# Patient Record
Sex: Male | Born: 1940
Health system: Southern US, Community
[De-identification: ages and names within clinical notes are randomized; demographics above are authoritative.]

## PROBLEM LIST (undated history)

## (undated) DIAGNOSIS — I509 Heart failure, unspecified: Secondary | ICD-10-CM

## (undated) DIAGNOSIS — G4730 Sleep apnea, unspecified: Secondary | ICD-10-CM

## (undated) DIAGNOSIS — I4892 Unspecified atrial flutter: Secondary | ICD-10-CM

## (undated) DIAGNOSIS — I499 Cardiac arrhythmia, unspecified: Secondary | ICD-10-CM

## (undated) DIAGNOSIS — N400 Enlarged prostate without lower urinary tract symptoms: Secondary | ICD-10-CM

## (undated) DIAGNOSIS — M545 Low back pain, unspecified: Secondary | ICD-10-CM

## (undated) DIAGNOSIS — I679 Cerebrovascular disease, unspecified: Secondary | ICD-10-CM

## (undated) DIAGNOSIS — I495 Sick sinus syndrome: Secondary | ICD-10-CM

## (undated) DIAGNOSIS — Z950 Presence of cardiac pacemaker: Secondary | ICD-10-CM

## (undated) DIAGNOSIS — I480 Paroxysmal atrial fibrillation: Secondary | ICD-10-CM

## (undated) DIAGNOSIS — R531 Weakness: Secondary | ICD-10-CM

## (undated) DIAGNOSIS — I10 Essential (primary) hypertension: Secondary | ICD-10-CM

## (undated) HISTORY — DX: Sleep apnea, unspecified: G47.30

## (undated) HISTORY — DX: Essential (primary) hypertension: I10

## (undated) HISTORY — DX: Low back pain, unspecified: M54.50

## (undated) HISTORY — PX: ABLATION: SHX5711

## (undated) HISTORY — DX: Benign prostatic hyperplasia without lower urinary tract symptoms: N40.0

## (undated) HISTORY — DX: Low back pain: M54.5

## (undated) HISTORY — DX: Unspecified atrial flutter: I48.92

## (undated) HISTORY — PX: INSERT / REPLACE / REMOVE PACEMAKER: SUR710

## (undated) HISTORY — DX: Sick sinus syndrome: I49.5

## (undated) HISTORY — PX: CATARACT EXTRACTION: SUR2

## (undated) HISTORY — DX: Paroxysmal atrial fibrillation: I48.0

## (undated) HISTORY — DX: Cerebrovascular disease, unspecified: I67.9

## (undated) HISTORY — DX: Weakness: R53.1

---

## 1964-09-18 HISTORY — PX: PACEMAKER INSERTION: SHX728

## 2002-06-16 ENCOUNTER — Ambulatory Visit (HOSPITAL_COMMUNITY): Admission: RE | Admit: 2002-06-16 | Discharge: 2002-06-16 | Payer: Self-pay | Admitting: Internal Medicine

## 2002-06-16 ENCOUNTER — Encounter: Payer: Self-pay | Admitting: Internal Medicine

## 2002-06-18 ENCOUNTER — Encounter (HOSPITAL_COMMUNITY): Admission: RE | Admit: 2002-06-18 | Discharge: 2002-07-18 | Payer: Self-pay | Admitting: Internal Medicine

## 2002-06-19 ENCOUNTER — Encounter: Payer: Self-pay | Admitting: Internal Medicine

## 2003-09-08 ENCOUNTER — Other Ambulatory Visit: Admission: RE | Admit: 2003-09-08 | Discharge: 2003-09-08 | Payer: Self-pay | Admitting: Dermatology

## 2004-01-21 ENCOUNTER — Ambulatory Visit (HOSPITAL_COMMUNITY): Admission: RE | Admit: 2004-01-21 | Discharge: 2004-01-21 | Payer: Self-pay | Admitting: *Deleted

## 2004-09-18 HISTORY — PX: CERVICAL DISCECTOMY: SHX98

## 2005-03-24 ENCOUNTER — Ambulatory Visit (HOSPITAL_COMMUNITY): Admission: RE | Admit: 2005-03-24 | Discharge: 2005-03-24 | Payer: Self-pay | Admitting: Internal Medicine

## 2005-03-27 ENCOUNTER — Ambulatory Visit (HOSPITAL_COMMUNITY): Admission: RE | Admit: 2005-03-27 | Discharge: 2005-03-27 | Payer: Self-pay | Admitting: Internal Medicine

## 2005-10-06 ENCOUNTER — Inpatient Hospital Stay (HOSPITAL_COMMUNITY): Admission: RE | Admit: 2005-10-06 | Discharge: 2005-10-07 | Payer: Self-pay | Admitting: Neurosurgery

## 2006-11-05 ENCOUNTER — Ambulatory Visit: Admission: RE | Admit: 2006-11-05 | Discharge: 2006-11-05 | Payer: Self-pay | Admitting: Internal Medicine

## 2006-11-17 ENCOUNTER — Ambulatory Visit: Payer: Self-pay | Admitting: Pulmonary Disease

## 2006-12-27 ENCOUNTER — Ambulatory Visit (HOSPITAL_COMMUNITY): Admission: RE | Admit: 2006-12-27 | Discharge: 2006-12-27 | Payer: Self-pay | Admitting: Internal Medicine

## 2007-06-11 ENCOUNTER — Ambulatory Visit (HOSPITAL_COMMUNITY): Admission: RE | Admit: 2007-06-11 | Discharge: 2007-06-11 | Payer: Self-pay | Admitting: Internal Medicine

## 2007-06-17 ENCOUNTER — Ambulatory Visit (HOSPITAL_COMMUNITY): Admission: RE | Admit: 2007-06-17 | Discharge: 2007-06-17 | Payer: Self-pay | Admitting: Internal Medicine

## 2007-09-19 DIAGNOSIS — Z950 Presence of cardiac pacemaker: Secondary | ICD-10-CM

## 2007-09-19 HISTORY — DX: Presence of cardiac pacemaker: Z95.0

## 2008-03-24 ENCOUNTER — Encounter (INDEPENDENT_AMBULATORY_CARE_PROVIDER_SITE_OTHER): Payer: Self-pay | Admitting: Internal Medicine

## 2008-03-24 ENCOUNTER — Ambulatory Visit: Payer: Self-pay | Admitting: Cardiology

## 2008-03-24 ENCOUNTER — Ambulatory Visit (HOSPITAL_COMMUNITY): Admission: RE | Admit: 2008-03-24 | Discharge: 2008-03-24 | Payer: Self-pay | Admitting: Internal Medicine

## 2008-03-25 ENCOUNTER — Ambulatory Visit (HOSPITAL_COMMUNITY): Admission: RE | Admit: 2008-03-25 | Discharge: 2008-03-25 | Payer: Self-pay | Admitting: Cardiology

## 2008-03-25 ENCOUNTER — Ambulatory Visit: Payer: Self-pay | Admitting: Cardiology

## 2008-03-26 ENCOUNTER — Encounter (INDEPENDENT_AMBULATORY_CARE_PROVIDER_SITE_OTHER): Payer: Self-pay | Admitting: *Deleted

## 2008-03-26 LAB — CONVERTED CEMR LAB
AST: 17 units/L
Alkaline Phosphatase: 72 units/L
BUN: 17 mg/dL
Glucose, Bld: 92 mg/dL
Platelets: 223 10*3/uL
Potassium: 3.9 meq/L
Sodium: 138 meq/L
TSH: 3.492 microintl units/mL
WBC: 6 10*3/uL

## 2008-03-30 ENCOUNTER — Ambulatory Visit: Payer: Self-pay | Admitting: Cardiology

## 2008-04-01 ENCOUNTER — Encounter (HOSPITAL_COMMUNITY): Admission: RE | Admit: 2008-04-01 | Discharge: 2008-05-01 | Payer: Self-pay | Admitting: Cardiology

## 2008-04-01 ENCOUNTER — Ambulatory Visit: Payer: Self-pay | Admitting: Cardiology

## 2008-04-08 ENCOUNTER — Ambulatory Visit: Payer: Self-pay | Admitting: Cardiology

## 2008-04-15 ENCOUNTER — Ambulatory Visit: Payer: Self-pay | Admitting: Cardiovascular Disease

## 2008-04-28 ENCOUNTER — Ambulatory Visit: Payer: Self-pay | Admitting: Cardiology

## 2008-05-15 ENCOUNTER — Ambulatory Visit: Payer: Self-pay | Admitting: Cardiology

## 2008-05-21 ENCOUNTER — Ambulatory Visit (HOSPITAL_COMMUNITY): Admission: RE | Admit: 2008-05-21 | Discharge: 2008-05-22 | Payer: Self-pay | Admitting: Internal Medicine

## 2008-05-21 ENCOUNTER — Ambulatory Visit: Payer: Self-pay | Admitting: Internal Medicine

## 2008-05-26 ENCOUNTER — Ambulatory Visit: Payer: Self-pay | Admitting: Cardiology

## 2008-05-28 ENCOUNTER — Ambulatory Visit (HOSPITAL_COMMUNITY): Admission: RE | Admit: 2008-05-28 | Discharge: 2008-05-28 | Payer: Self-pay | Admitting: Cardiology

## 2008-06-05 ENCOUNTER — Ambulatory Visit: Payer: Self-pay | Admitting: Cardiology

## 2008-06-10 ENCOUNTER — Ambulatory Visit: Payer: Self-pay

## 2008-06-23 ENCOUNTER — Ambulatory Visit: Payer: Self-pay | Admitting: Cardiology

## 2008-07-06 ENCOUNTER — Ambulatory Visit: Payer: Self-pay | Admitting: Cardiology

## 2008-07-10 ENCOUNTER — Ambulatory Visit: Payer: Self-pay | Admitting: Cardiology

## 2008-07-10 ENCOUNTER — Emergency Department (HOSPITAL_COMMUNITY): Admission: RE | Admit: 2008-07-10 | Discharge: 2008-07-10 | Payer: Self-pay | Admitting: Cardiology

## 2008-07-20 ENCOUNTER — Ambulatory Visit: Payer: Self-pay | Admitting: Cardiology

## 2008-07-24 ENCOUNTER — Ambulatory Visit (HOSPITAL_COMMUNITY): Admission: RE | Admit: 2008-07-24 | Discharge: 2008-07-24 | Payer: Self-pay | Admitting: Cardiology

## 2008-07-24 ENCOUNTER — Ambulatory Visit: Payer: Self-pay | Admitting: Cardiology

## 2008-07-24 ENCOUNTER — Encounter: Payer: Self-pay | Admitting: Cardiology

## 2008-07-31 DIAGNOSIS — G8929 Other chronic pain: Secondary | ICD-10-CM | POA: Insufficient documentation

## 2008-07-31 DIAGNOSIS — I679 Cerebrovascular disease, unspecified: Secondary | ICD-10-CM

## 2008-07-31 DIAGNOSIS — M545 Low back pain, unspecified: Secondary | ICD-10-CM | POA: Insufficient documentation

## 2008-07-31 DIAGNOSIS — Z87898 Personal history of other specified conditions: Secondary | ICD-10-CM

## 2008-08-26 ENCOUNTER — Ambulatory Visit: Payer: Self-pay | Admitting: Internal Medicine

## 2008-10-01 ENCOUNTER — Encounter (INDEPENDENT_AMBULATORY_CARE_PROVIDER_SITE_OTHER): Payer: Self-pay | Admitting: *Deleted

## 2008-10-13 ENCOUNTER — Ambulatory Visit: Payer: Self-pay | Admitting: Cardiology

## 2009-06-16 ENCOUNTER — Ambulatory Visit: Payer: Self-pay | Admitting: Internal Medicine

## 2009-06-16 DIAGNOSIS — Z950 Presence of cardiac pacemaker: Secondary | ICD-10-CM | POA: Insufficient documentation

## 2009-06-25 ENCOUNTER — Encounter: Payer: Self-pay | Admitting: Adult Health

## 2009-12-22 ENCOUNTER — Encounter: Payer: Self-pay | Admitting: Internal Medicine

## 2009-12-22 ENCOUNTER — Ambulatory Visit: Payer: Self-pay | Admitting: Cardiology

## 2009-12-28 ENCOUNTER — Telehealth (INDEPENDENT_AMBULATORY_CARE_PROVIDER_SITE_OTHER): Payer: Self-pay | Admitting: *Deleted

## 2010-02-08 ENCOUNTER — Encounter (INDEPENDENT_AMBULATORY_CARE_PROVIDER_SITE_OTHER): Payer: Self-pay | Admitting: *Deleted

## 2010-02-09 ENCOUNTER — Encounter (INDEPENDENT_AMBULATORY_CARE_PROVIDER_SITE_OTHER): Payer: Self-pay | Admitting: *Deleted

## 2010-02-17 ENCOUNTER — Ambulatory Visit: Payer: Self-pay | Admitting: Cardiology

## 2010-02-17 DIAGNOSIS — E785 Hyperlipidemia, unspecified: Secondary | ICD-10-CM

## 2010-02-17 DIAGNOSIS — R42 Dizziness and giddiness: Secondary | ICD-10-CM

## 2010-02-17 DIAGNOSIS — R079 Chest pain, unspecified: Secondary | ICD-10-CM

## 2010-02-23 ENCOUNTER — Ambulatory Visit: Payer: Self-pay | Admitting: Cardiology

## 2010-02-23 ENCOUNTER — Ambulatory Visit (HOSPITAL_COMMUNITY): Admission: RE | Admit: 2010-02-23 | Discharge: 2010-02-23 | Payer: Self-pay | Admitting: Cardiology

## 2010-02-23 ENCOUNTER — Encounter: Payer: Self-pay | Admitting: Cardiology

## 2010-02-24 ENCOUNTER — Encounter: Payer: Self-pay | Admitting: Cardiology

## 2010-03-07 ENCOUNTER — Encounter: Payer: Self-pay | Admitting: Cardiology

## 2010-08-04 ENCOUNTER — Ambulatory Visit (HOSPITAL_COMMUNITY): Admission: RE | Admit: 2010-08-04 | Payer: Self-pay | Admitting: Physical Medicine and Rehabilitation

## 2010-10-04 ENCOUNTER — Telehealth (INDEPENDENT_AMBULATORY_CARE_PROVIDER_SITE_OTHER): Payer: Self-pay

## 2010-10-08 ENCOUNTER — Encounter: Payer: Self-pay | Admitting: Physical Medicine and Rehabilitation

## 2010-10-18 NOTE — Assessment & Plan Note (Signed)
Summary: past due for f/u per pt request/tg   Visit Type:  Follow-up Primary Provider:  Dr.Roy Fagen   History of Present Illness: [name] returns to the office as scheduled for continued assessment and treatment of sick sinus syndrome.  He remains essentially asymptomatic, reporting only brief orthostatic lightheadedness when arising from a chair or from a reclining position.  He has suffered no falls nor loss of consciousness.  He experiences occasional episodic vague parasternal chest discomfort unassociated with exertion, which resolves spontaneously.  Exercise tolerance is good without any exertion-related symptoms.  He denies dyspnea, orthopnea, PND or pedal edema.  He has stopped his walking program and gained a significant amount of weight.  Current Medications (verified): 1)  Diltiazem Hcl Er Beads 420 Mg Xr24h-Cap (Diltiazem Hcl Er Beads) .... Take 1 Tab Daily 2)  Omeprazole 20 Mg Cpdr (Omeprazole) .... Take 1 Tab Daily 3)  Tamsulosin Hcl 0.4 Mg Caps (Tamsulosin Hcl) .... Take 1 Tab Daily 4)  Metoprolol Tartrate 25 Mg Tabs (Metoprolol Tartrate) .... Take 1/2 Tab Two Times A Day 5)  Hydrocodone-Acetaminophen 5-500 Mg Tabs (Hydrocodone-Acetaminophen) .... Take As Needed For Pain 6)  Ecotrin 325 Mg Tbec (Aspirin) .... One By Mouth Daily 7)  Gabapentin 300 Mg Caps (Gabapentin) .... Take 1 Tab Three Times A Day 8)  Cozaar 50 Mg Tabs (Losartan Potassium) .... Take 1 Tab Daily  Allergies (verified): 1)  Penicillin  Past History:  PMH, FH, and Social History reviewed and updated.  Review of Systems       See history of present illness.  Vital Signs:  Patient profile:   70 year old male Weight:      235 pounds BMI:     35.86 Pulse rate:   62 / minute BP sitting:   115 / 59  (right arm)  Vitals Entered By: Dreama Saa, CNA (February 17, 2010 2:27 PM)  Physical Exam  General:    Overweight; well developed; no acute distress:   Weight-235, 16 pounds more than in  09/2008 Neck-No JVD; no carotid bruits: Lungs-No tachypnea, no rales; no rhonchi; no wheezes: Cardiovascular-normal PMI; distant S1 and S2: Abdomen-BS normal; soft and non-tender without masses or organomegaly:  Musculoskeletal-No deformities, no cyanosis or clubbing: Neurologic-Normal cranial nerves; symmetric strength and tone:  Skin-Warm, no significant lesions: Extremities-Nl distal pulses; no edema:     PPM Specifications Following MD:  Lewayne Bunting, MD     PPM Vendor:  St Jude     PPM Model Number:  [id number]     PPM Serial Number:  [id number] PPM DOI:  05/21/2008     PPM Implanting MD:  Sherryl Manges, MD  Lead 1    Location: RA     DOI: 05/21/2008     Model #: 1688TC     Serial #: [id number]     Status: active Lead 2    Location: RV     DOI: 05/21/2008     Model #: 1688TC     Serial #: [id number]     Status: active  Magnet Response Rate:  BOL 98.6 ERI  86.3  Indications:  Tachy-brady syndrome   PPM Follow Up Pacer Dependent:  No      Episodes Coumadin:  No  Parameters Mode:  DDDR     Lower Rate Limit:  60     Upper Rate Limit:  120 Paced AV Delay:  275     Sensed AV Delay:  250  Impression & Recommendations:  Problem #  1:  HYPERLIPIDEMIA (ICD-272.4) Metabolic profile and lipid profile will be obtained.  Problem # 2:  ATRIAL FIBRILLATION (ICD-427.31) Most recent pacemaker assessment demonstrated atrial fibrillation less than 4% of the time with no episode extending beyond 10 hours.  This should convey a low risk for thromboembolism, and treatment with Coumadin does not appear warranted.  Problem # 3:  CARDIAC PACEMAKER IN SITU (ICD-V45.01) Dr. Ladona Ridgel continues to assess the patient's pacemaker regularly.  Problem # 4:  CHEST PAIN (ICD-786.50) Although chest discomfort is atypical, patient has multiple risk factors for atherosclerosis and an equivocal stress nuclear study a few years ago..  A stress echocardiogram will be performed.  If results are good as expected, I will plan  to see this nice gentleman again in one year.  Other Orders: T-Comprehensive Metabolic Panel [id number]) T-Lipid Profile ([id number]) Stress Echo (Stress Echo)  Patient Instructions: 1)  Your physician recommends that you schedule a follow-up appointment in: 1 year 2)  Your physician recommends that you return for lab work in: Tomorrow 3)  Your physician recommends that you continue on your current medications as directed. Please refer to the Current Medication list given to you today. 4)  Your physician has requested that you have a stress echocardiogram. For further information please visit [email or web address]  Please follow instruction sheet as given. 5)  Your physician discussed the importance of regular exercise and recommended that you start or continue a regular exercise program for good health. 6)  Your physician encouraged you to lose weight for better health.

## 2010-10-18 NOTE — Procedures (Signed)
Summary: 6 mth f/u per checkout on 06/16/09/tg   Current Medications (verified): 1)  Diltiazem Hcl Er Beads 420 Mg Xr24h-Cap (Diltiazem Hcl Er Beads) .... Take 1 Tab Daily 2)  Omeprazole 20 Mg Cpdr (Omeprazole) .... Take 1 Tab Daily 3)  Tamsulosin Hcl 0.4 Mg Caps (Tamsulosin Hcl) .... Take 1 Tab Daily 4)  Metoprolol Tartrate 25 Mg Tabs (Metoprolol Tartrate) .... Take 1/2 Tab Two Times A Day 5)  Hydrocodone-Acetaminophen 5-500 Mg Tabs (Hydrocodone-Acetaminophen) .... Take As Needed For Pain 6)  Ecotrin 325 Mg Tbec (Aspirin) .... One By Mouth Daily  Allergies (verified): 1)  Penicillin   PPM Specifications Following MD:  Lewayne Bunting, MD     PPM Vendor:  St Jude     PPM Model Number:  [id number]     PPM Serial Number:  [id number] PPM DOI:  05/21/2008     PPM Implanting MD:  Sherryl Manges, MD  Lead 1    Location: RA     DOI: 05/21/2008     Model #: 1688TC     Serial #: [id number]     Status: active Lead 2    Location: RV     DOI: 05/21/2008     Model #: 1688TC     Serial #: [id number]     Status: active  Magnet Response Rate:  BOL 98.6 ERI  86.3  Indications:  Tachy-brady syndrome   PPM Follow Up Remote Check?  No Battery Voltage:  2.78 V     Battery Est. Longevity:  5 years     Pacer Dependent:  No       PPM Device Measurements Atrium  Amplitude: 1.5 mV, Impedance: 476 ohms,  Right Ventricle  Amplitude: 12 mV, Impedance: 590 ohms, Threshold: 0.75 V at 0.5 msec  Episodes MS Episodes:  127     Percent Mode Switch:  3.4%     Coumadin:  No Ventricular High Rate:  1     Atrial Pacing:  88%     Ventricular Pacing:  <1%  Parameters Mode:  DDDR     Lower Rate Limit:  60     Upper Rate Limit:  120 Paced AV Delay:  275     Sensed AV Delay:  250 Next Cardiology Appt Due:  06/18/2010 Tech Comments:  No parameter changes.  A-fib with 1 VHR noted.  He is not on coumadin.  The longest A-fib episode was 9:46 hours.  ROV 6 months with Dr. Ladona Ridgel in [place]. Altha Harm, LPN  December 22, 2829 10:25 AM  MD  Comments:  Agree with above. Chads 1

## 2010-10-18 NOTE — Miscellaneous (Signed)
Summary: nuclear stress test   Clinical Lists Changes  Observations: Added new observation of NUCLEAR NOS:  The patient exercised 9 minutes 52 seconds (52 seconds into stage IV of the  Bruce protocol) obtaining a maximal heart rate of 144 (90% of the age-  predicted maximal heart rate) at a workload of 12.9 METS and discontinued  exercise due to fatigue.  There were no symptoms of chest pain.  There were  infrequent atrial and ventricular premature complexes.  There was 1 mm ST  segment depression during late exercise which was not consistently exhibited  on tracings during recovery.  The baseline electrocardiogram revealed normal  sinus rhythm at 62 beats per minute with inferolateral T-wave inversions.  His initial blood pressure was elevated at 150/68.  He reached a peak blood  pressure of 200/60.   IMPRESSION:  Borderline exercise stress test.  Cardiolite images pending.                                               Kingsley Callander. Ouida Sills, M.D.  (06/18/2002 9:07)      Nuclear Study  Procedure date:  06/18/2002  Findings:       The patient exercised 9 minutes 52 seconds (52 seconds into stage IV of the  Bruce protocol) obtaining a maximal heart rate of 144 (90% of the age-  predicted maximal heart rate) at a workload of 12.9 METS and discontinued  exercise due to fatigue.  There were no symptoms of chest pain.  There were  infrequent atrial and ventricular premature complexes.  There was 1 mm ST  segment depression during late exercise which was not consistently exhibited  on tracings during recovery.  The baseline electrocardiogram revealed normal  sinus rhythm at 62 beats per minute with inferolateral T-wave inversions.  His initial blood pressure was elevated at 150/68.  He reached a peak blood  pressure of 200/60.   IMPRESSION:  Borderline exercise stress test.  Cardiolite images pending.                                               Kingsley Callander. Ouida Sills, M.D.

## 2010-10-18 NOTE — Letter (Signed)
Summary: LABS 01-24-10  LABS 01-24-10   Imported By: [name] 02/24/2010 11:01:24  _____________________________________________________________________  External Attachment:    Type:   Image     Comment:   External Document

## 2010-10-18 NOTE — Progress Notes (Signed)
Summary: phone note for refill  Phone Note Call from Patient   Caller: Patient Summary of Call: s:wants refill for metoprolol tartrate sent to [facility] [place] [facility]mg  takes 1/2 tab two times a day also would like a 90 day supply instead of 30. Initial call taken by: Dreama Saa, CNA,  December 28, 2009 11:56 AM  Follow-up for Phone Call        sent to lynn who took care of this refill

## 2010-10-18 NOTE — Cardiovascular Report (Signed)
Summary: Office Visit   Office Visit   Imported By: Roderic Ovens 01/03/2010 13:08:11  _____________________________________________________________________  External Attachment:    Type:   Image     Comment:   External Document

## 2010-10-18 NOTE — Letter (Signed)
Summary: [place] [facility], agricultural at [facility]  [address], [place] [address]   Phone: [phone]  Fax: [phone]      March 07, 2010 MRN: [id number]   [name] [address] [place], [place]  [address]   Dear [name],  Your test ordered by Selena Batten has been reviewed by your physician (or physician assistant) and was found to be normal or stable. Your physician (or physician assistant) felt no changes were needed at this time.  __X__ Echocardiogram  ____ Cardiac Stress Test  ____ Lab Work  ____ Peripheral vascular study of arms, legs or neck  ____ CT scan or X-ray  ____ Lung or Breathing test  ____ Other: Please continue on current medical treatment.   Thank you.  [place] Bing, MD, F.A.C.C

## 2010-10-18 NOTE — Miscellaneous (Signed)
Summary: LABS CBCD,CMP,TSH,03/26/2008  Clinical Lists Changes  Observations: Added new observation of CALCIUM: 9.1 mg/dL (69/62/9528 41:32) Added new observation of ALBUMIN: 4.5 g/dL (44/09/270 53:66) Added new observation of PROTEIN, TOT: 7.5 g/dL (44/11/4740 59:56) Added new observation of SGPT (ALT): 15 units/L (03/26/2008 16:37) Added new observation of SGOT (AST): 17 units/L (03/26/2008 16:37) Added new observation of ALK PHOS: 72 units/L (03/26/2008 16:37) Added new observation of CREATININE: 1.11 mg/dL (38/75/6433 29:51) Added new observation of BUN: 17 mg/dL (88/41/6606 30:16) Added new observation of BG RANDOM: 92 mg/dL (09/26/3233 57:32) Added new observation of CO2 PLSM/SER: 24 meq/L (03/26/2008 16:37) Added new observation of CL SERUM: 101 meq/L (03/26/2008 16:37) Added new observation of K SERUM: 3.9 meq/L (03/26/2008 16:37) Added new observation of NA: 138 meq/L (03/26/2008 16:37) Added new observation of PLATELETK/UL: 223 K/uL (03/26/2008 16:37) Added new observation of MCV: 95.6 fL (03/26/2008 16:37) Added new observation of HCT: 43.4 % (03/26/2008 16:37) Added new observation of HGB: 14.2 g/dL (20/25/4270 62:37) Added new observation of WBC COUNT: 6.0 10*3/microliter (03/26/2008 16:37) Added new observation of TSH: 3.492 microintl units/mL (03/26/2008 16:37)

## 2010-10-18 NOTE — Letter (Signed)
Summary: Stress Echocardiogram Information Sheet  [name]  [address], [place] [address]   Phone: [phone]  Fax: [phone]      February 17, 2010 MRN: [id number] light prior to the test.   [name]  Doctor: Appointment Date: Appointment Time: Appointment Location: [facility]  Stress Echocardiogram Information Sheet    Instructions:   1. DO NOT  take your ___AM______ medicines  the morning of test.  2. Do not eat or drink after midnight the night before test.  3. Dress prepared to exercise.  4. DO NOT use ANY caffine or tobacco products 3 hours before appointment.  5. Report to the Short Stay Center on the1st floor.  6. Please bring all current prescription medications.  7. If you have any questions, please call [phone]

## 2010-10-20 NOTE — Progress Notes (Signed)
**Note De-Identified [name] Obfuscation** Summary: RX REFILL  Phone Note Call from Patient Call back at Home Phone [phone]   Caller: PT Reason for Call: Refill Medication, Talk to Nurse Summary of Call: PT NEEDS [place] TO CALL [facility] TO GIVE METOPROLOL REFILL, THEY SAID THEY ARE NOT GETTING OUR REQUEST FOR REFILLS. Initial call taken by: [name],  October 04, 2010 2:23 PM    New/Updated Medications: METOPROLOL TARTRATE 25 MG TABS (METOPROLOL TARTRATE) take 1/2 tab two times a day Prescriptions: METOPROLOL TARTRATE 25 MG TABS (METOPROLOL TARTRATE) take 1/2 tab two times a day  #15 x 6   Entered by:   Larita Fife Ryna Beckstrom LPN   Authorized by:   Joni Reining, NP   Signed by:   Larita Fife Sarath Privott LPN on 29/56/2130   Method used:   Electronically to        [facility] (retail)       [address]       [place], [place]  [address]       Ph: [phone]       Fax: [phone]   RxID:   [id number]

## 2010-11-21 ENCOUNTER — Encounter: Payer: Self-pay | Admitting: Internal Medicine

## 2010-11-21 ENCOUNTER — Encounter (INDEPENDENT_AMBULATORY_CARE_PROVIDER_SITE_OTHER): Payer: MEDICARE | Admitting: Internal Medicine

## 2010-11-21 DIAGNOSIS — I495 Sick sinus syndrome: Secondary | ICD-10-CM

## 2010-11-21 DIAGNOSIS — I10 Essential (primary) hypertension: Secondary | ICD-10-CM

## 2010-11-28 ENCOUNTER — Encounter: Payer: Self-pay | Admitting: Internal Medicine

## 2010-11-29 NOTE — Assessment & Plan Note (Signed)
Summary: pc2/per pt walk in/tmj r.s due to epic/tmj/hm   Visit Type:  Follow-up Primary Provider:  Forest Becker  CC:  no cardiology complaints.  History of Present Illness: Mr. Rutledge returns today for followup.  He is very pleasant, middle-aged man with symptomatic tachy-brady syndrome and atrial fibrillationand flutter.  He is status post pacemaker insertion secondary to all the above in the setting of pauses and symptomatic bradycardia.  The patient returns today for followup.  He has not been able to tolerate his CPAP. He denies c/p or sob. Minimal palpitations and no syncope. He admits to dietary indiscretion.  Current Medications (verified): 1)  Diltiazem Hcl Er Beads 420 Mg Xr24h-Cap (Diltiazem Hcl Er Beads) .... Take 1 Tab Daily 2)  Omeprazole 20 Mg Cpdr (Omeprazole) .... Take 1 Tab Daily 3)  Tamsulosin Hcl 0.4 Mg Caps (Tamsulosin Hcl) .... Take 1 Tab Daily 4)  Metoprolol Tartrate 25 Mg Tabs (Metoprolol Tartrate) .... Take 1/2 Tab Two Times A Day 5)  Hydrocodone-Acetaminophen 5-500 Mg Tabs (Hydrocodone-Acetaminophen) .... Take As Needed For Pain 6)  Ecotrin 325 Mg Tbec (Aspirin) .... One By Mouth Daily 7)  Cozaar 50 Mg Tabs (Losartan Potassium) .... Take 1 Tab Daily 8)  Lyrica 75 Mg Caps (Pregabalin) .... Take 1 Tab Two Times A Day  Allergies (verified): 1)  Penicillin  Comments:  Nurse/Medical Assistant: patient brought med list they stopped his gabapentin and started him on lyrica 75 mg two times a day [facility] [place]  Past History:  Past Medical History: Last updated: 07/31/2008 Near syncope; the weakness; Mylanta; exercise intolerance. Atrial flutter with sick sinus syndrome; dual-chamber pacing initiated in 05/2007. Hypertension. BPH. Carotid bruit: No focal stenosis by carotid ultrasound in 05/2008. Low back pain; history of trauma.  Past Surgical History: Last updated: 07/31/2008 Diskectomy in 2006 Bilateral cataract extraction  Review of  Systems  The patient denies chest pain, syncope, dyspnea on exertion, and peripheral edema.    Vital Signs:  Patient profile:   70 year old male Weight:      236 pounds BMI:     36.01 Pulse rate:   68 / minute BP sitting:   152 / 81  (left arm)  Vitals Entered By: Dreama Saa, CNA (November 21, 2010 8:58 AM)  Physical Exam  General:    Overweight; well developed; no acute distress:   Weight-236, 17 pounds more than in 09/2008 Neck-No JVD; no carotid bruits: Lungs-No tachypnea, no rales; no rhonchi; no wheezes: Cardiovascular-normal PMI; distant S1 and S2: Abdomen-BS normal; soft and non-tender without masses or organomegaly:  Musculoskeletal-No deformities, no cyanosis or clubbing: Neurologic-Normal cranial nerves; symmetric strength and tone:  Skin-Warm, no significant lesions: Extremities-Nl distal pulses; no edema:     PPM Specifications Following MD:  Lewayne Bunting, MD     PPM Vendor:  St Jude     PPM Model Number:  5826     PPM Serial Number:  [id number] PPM DOI:  05/21/2008     PPM Implanting MD:  Sherryl Manges, MD  Lead 1    Location: RA     DOI: 05/21/2008     Model #: 1688TC     Serial #: [id number]     Status: active Lead 2    Location: RV     DOI: 05/21/2008     Model #: 1688TC     Serial #: [id number]     Status: active  Magnet Response Rate:  BOL 98.6 ERI  86.3  Indications:  Tachy-brady syndrome   PPM Follow Up Pacer Dependent:  No      Episodes Coumadin:  No  Parameters Mode:  DDDR     Lower Rate Limit:  60     Upper Rate Limit:  120 Paced AV Delay:  275     Sensed AV Delay:  250 MD Comments:  Normal device function.  Impression & Recommendations:  Problem # 1:  CARDIAC PACEMAKER IN SITU (ICD-V45.01) His device is working normally. He will followup in several months.  Problem # 2:  ATRIAL FIBRILLATION (ICD-427.31) His symptoms are well controlled. He is still CHADS 1 and he will continue ASA.  His updated medication list for this problem includes:     Metoprolol Tartrate 25 Mg Tabs (Metoprolol tartrate) .Marland Kitchen... Take 1/2 tab two times a day    Ecotrin 325 Mg Tbec (Aspirin) ..... One by mouth daily  Problem # 3:  HYPERTENSION (ICD-401.1) His blood pressure is not well controlled. I have asked him to eat less, and eat less salt, exercise and lose weight. He will continue his current meds. His updated medication list for this problem includes:    Diltiazem Hcl Er Beads 420 Mg Xr24h-cap (Diltiazem hcl er beads) .Marland Kitchen... Take 1 tab daily    Metoprolol Tartrate 25 Mg Tabs (Metoprolol tartrate) .Marland Kitchen... Take 1/2 tab two times a day    Ecotrin 325 Mg Tbec (Aspirin) ..... One by mouth daily    Cozaar 50 Mg Tabs (Losartan potassium) .Marland Kitchen... Take 1 tab daily  Patient Instructions: 1)  Your physician recommends that you schedule a follow-up appointment in: Dr. Ladona Ridgel  1 year 2)  [name]   6 months

## 2011-01-31 NOTE — Assessment & Plan Note (Signed)
[facility]                       [place] CARDIOLOGY OFFICE NOTE   CARLETON, VANVALKENBURGH                    MRN:          [id number]  DATE:06/05/2008                            DOB:          1941/03/29    CARDIOLOGIST:  Gerrit Friends. Dietrich Pates, MD, [facility]   PRIMARY CARE PHYSICIAN:  Kingsley Callander. Ouida Sills, MD   REASON FOR VISIT:  Post hospitalization followup.   HISTORY OF PRESENT ILLNESS:  [name] is a 70 year old male patient  with a history of hypertension and paroxysmal atrial fibrillation, who  recently was diagnosed with tachybrady syndrome and set up for a  permanent pacemaker implantation.  The patient had a dual-chamber device  implanted by Dr. Graciela [facility]. Jude model [phone]).  He had no complications.  Carotid bruit was noted at discharge and he had carotid Dopplers set up.  This was done in May 28, 2008 that revealed bilateral plaque, but  no significant ICA stenosis.  In the office today, he notes that he was  started on metoprolol 25 mg twice a day after his pacemaker was  implanted.  He felt somewhat fatigued with this and then he saw Dr.  Ouida Sills earlier this week.  This was decreased to 12.5 mg twice a day.  He  feels better now.  He denies any recurrent episodes of tachy  palpitations or lightheadedness.  He denies chest pain or significant  shortness of breath.  He denies syncope.   CURRENT MEDICATIONS:  Diltiazem 420 mg daily, Omeprazole 20 mg daily,  Hydrochlorothiazide 25 mg daily, Potassium 200 mEq a day,  Warfarin as directed, Losartan 100 mg nightly, Flomax 0.4 mg nightly,  Metoprolol 12.5 mg b.i.d.   PHYSICAL EXAMINATION:  GENERAL:  He is a well-nourished, well-developed  male.  VITAL SIGNS:  Blood pressure is 132/80, pulse 56, weight 224 pounds.  HEENT:  Normal.  NECK:  Without JVD.  CARDIAC:  Normal S1 and S2.  Regular rate and rhythm.  No murmur.  LUNGS:  Clear to auscultation bilaterally.  ABDOMEN:  Soft and nontender.  EXTREMITIES:  No edema.  NEUROLOGIC:  He is alert and oriented x3.  Cranial nerves II-XII grossly  intact.  CHEST:  Pacemaker site with minimal swelling.  No erythema or discharge.  Steri-Strips still intact.   Electrocardiogram reveals atrial paced rhythm with a heart rate of 60.  Normal axis.  No acute changes with the magnet.  He has AV pacing and a  ventricular rate of 99.   ASSESSMENT AND PLAN:  1. Paroxysmal atrial fibrillation with tachybrady syndrome.  He is      status post pacemaker implantation by Dr. Graciela Husbands.  He seems to be      recovering well from this and he is [facility]      [facility] and Dr. Graciela Husbands in 3 months.  He remains on Coumadin therapy.      His CHADS-2 score of 1 puts him at fairly low thromboembolic risk.      I discussed this with Dr. Dietrich Pates.  At this point in time, Dr.      Dietrich Pates wants to continue  him on Coumadin therapy and make a      decision on this when he is seen back in followup.  He did have      some fatigue with higher doses of metoprolol.  I have asked him to      continue at his current dose and we will reassess his symptoms when      he returns for followup.  If he continues to be symptomatic with      his paroxysmal atrial fibrillation, we will need to increase his      dosage back at 25 mg twice a day.  2. Hypertension.  This is overall well controlled.  He will continue      to follow up with Dr. Ouida Sills  3. Carotid bruit.  As noted above, his carotid Dopplers revealed no      significant internal carotid artery stenosis.   DISPOSITION:  The patient will be brought back in followup with Dr.  Dietrich Pates in the next 70 months or sooner p.r.n.      Tereso Newcomer, PA-C  Electronically Signed      Gerrit Friends. Dietrich Pates, MD, [facility]  Electronically Signed   SW/MedQ  DD: 06/05/2008  DT: 06/06/2008  Job #: [id number]   cc:   Kingsley Callander. Ouida Sills, MD

## 2011-01-31 NOTE — Letter (Signed)
May 15, 2008    Kingsley Callander. Ouida Sills, MD  [address]  [place], [place] [address]   RE:  [name], [name]  MRN:  [id number]  /  DOB:  November 25, 1940   Dear [name],   [name] returns to the office for continued assessment and  treatment of newly diagnosed atrial arrhythmias and symptoms that  include near syncope.  He initially did well since his last visit with  few symptoms in the first few weeks.  Over the past week or so, he has  had additional episodes of lightheadedness, 01/01.  He was walking  outside in the heat.  He felt better when he came into a cooling  environment.  He has substantial dyspnea with exertion, but no chest  discomfort.   Event recording was maintained for 3 weeks.  We were called on numerous  occasions from the monitoring company with atrial fibrillation and  flutter, sometimes with a controlled ventricular response and sometimes  with a rapid ventricular response.  In addition, there were brady  arrhythmias, typically sinus bradycardia in the 50s.  Maximal heart rate  was approximately 170.  Pauses of up to 2.5 seconds were recorded.  The  patient reported numerous symptomatic spells.  Typically, when he was  short of breath or short of breath with fatigue, he believes that he was  walking.  Heart rates remained low in those circumstances, and  arrhythmias were typically not noted.  There were a some episodes during  which he described that his heart was racing when normal sinus rhythm or  sinus rhythm with PACs was present.  He did have some intervals when he  seemed to detect his atrial arrhythmias and reported combinations of  heart racing, fatigue and dyspnea.  One eight beat run of ventricular  tachycardia was recorded.  All in all, it was difficult to specify  whether there was correlation between symptoms and heart rhythm.   Current medications include,  1. Diltiazem 420 mg daily.  2. Omeprazole 20 mg daily.  3. HCTZ 25 mg daily.  4. KCl 20 mEq  daily.  5. Warfarin as directed with therapeutic anticoagulation.  6. Losartan 100 mg daily.  7. Flomax 0.4 mg nightly.  8. Hydrocodone p.r.n.   PHYSICAL EXAMINATION:  GENERAL:  On exam, pleasant, somewhat overweight  gentleman in no acute distress.  VITAL SIGNS:  The weight is 225.  Blood pressure 135/70, heart rate 72  and regular.  NECK:  No jugular venous distention.  LUNGS:  Clear.  CARDIAC:  Normal first and second heart sounds.  ABDOMEN:  Soft and nontender; no organomegaly.  EXTREMITIES:  Trace edema; mild stasis changes; erythematous papules  consistent with arthropod bites.   IMPRESSION:  [name] has sick sinus syndrome.  Although, he is not  highly symptomatic related to atrial fibrillation, he is probably  experiencing some symptoms related to his arrhythmia and to excessive  heart rates.  Further rate control medication cannot be given due to the  likelihood of causing symptomatic bradycardia.  I have recommended  pacemaker implantation, which will be scheduled in the near future.  We  will subsequently add beta blocker and titrate until heart rate is  adequately controlled.  If he remains symptomatic at that point, other  options will be explored.  This recommendation was discussed at length  with [name] in a 1/2 hour appointment.  He agrees to proceed.    Sincerely,      Gregory Friends. Rothbart,  MD, [facility]  Electronically Signed    RMR/MedQ  DD: 05/15/2008  DT: 05/16/2008  Job #: [id number]

## 2011-01-31 NOTE — Letter (Signed)
March 25, 2008    Kingsley Callander. Ouida Sills, MD  [address]  [place], [place] [address]   RE:  [name], [name]  MRN:  [id number]  /  DOB:  1941-02-19   Dear [name],   It was my pleasure evaluating [name] in the office today in  consultation at your urgent request.  As you know, this nice gentleman  has enjoyed generally excellent health.  He has hypertension that has  been well controlled with medical therapy.  He has had no known  cardiovascular disease.  He has never been evaluated by a cardiologist.  He did undergo a stress test approximately 6 years ago with negative  results.  He recently was seen in your office complaining of  lightheadedness.  He also has some vague symptoms of increasing fatigue,  decreased exercise tolerance, and malaise.  He describes a sensation of  having to take a deep breath when he exerts himself.  He has also had  exertional lightheadedness with near syncope.  Evaluation in your office  is notable for his EKG, which showed atrial flutter with a controlled  ventricular response.  An echocardiogram was subsequently obtained,  which showed mild LVH with normal left ventricular systolic function, no  significant valvular disease, and normal chamber dimensions including  the left atrium.   PAST MEDICAL HISTORY:  Otherwise notable for BPH, a diskectomy performed  in 2006, and bilateral cataract surgery.   CURRENT MEDICATIONS:  1. Aspirin 81 mg daily.  2. Diltiazem 420 mg daily.  3. Omeprazole 20 daily.  4. HCTZ 25 mg daily.  5. KCl 20 mEq daily.  6. Warfarin 10 mg initially and subsequently 5 mg daily.  7. Losartan 100 mg daily.  8. Flomax 0.4 mg nightly.  9. He uses hydrocodone p.r.n.   ALLERGIES:  Drug allergy to PENICILLIN reported.   SOCIAL HISTORY:  Retired; divorced with 3 children; remains active  including gardening and walking.   FAMILY HISTORY:  Father died at age 31 due to myocardial infraction and  mother at age 42 due to neoplastic  disease.  He has one sister who is  alive and well.   REVIEW OF SYSTEMS:  Notable for occasional headaches, the need for  corrective lenses for near vision, symptoms of GERD, urinary frequency,  chronic low back pain.  All other systems reviewed and are negative.   PHYSICAL EXAMINATION:  GENERAL:  Pleasant, overweight gentleman in no  acute distress.  VITAL SIGNS:  The weight is 216, blood pressure 115/70 without  orthostatic change, heart rate 70 and regular, and respirations 14.  HEENT:  EOMs full; normals lids and conjunctivae; normal oral mucosa.  NECK:  No jugular venous distention; normal carotid upstrokes without  bruits.  ENDOCRINE:  No thyromegaly.  HEMATOPOIETIC:  No adenopathy.  LUNGS:  Clear.  CARDIAC:  Normal first and second heart sounds.  No murmur.  No gallop  appreciated.  ABDOMEN:  Soft and nontender; no masses; organomegaly.  EXTREMITIES:  No edema; normal distal pulses.  NEUROLOGIC:  Symmetric strength and tone; normal cranial nerves.   IMAGING STUDIES:  EKG:  Normal sinus rhythm; left atrial abnormality;  slightly delayed R-wave progression; minimal nonspecific T-wave  abnormality.  Comparison to prior tracing obtained on March 23, 2008:  Atrial flutter is no longer present.  R-wave progression was previously  normal.   IMPRESSION:  [name] has nonspecific symptoms that would be  consistent with orthostatic hypotension, but no change in blood pressure  with  standing is documented in the office today.  He also has symptoms  that sound like exertional presyncope.  He has no IHSS or valvular  disease account for this.  I have some concern about exercise-induced  ischemia causing his symptoms.  We will proceed with a stress Myoview  study at the patient's earliest convenience.   He had a symptomatic atrial flutter when he was seen in your office.  He  will carry an event recorder for 21 days to evaluate the frequency and  duration of his arrhythmia.   He  has moderate risk for thromboembolic disease in the setting of atrial  arrhythmias.  His notable risk factor is hypertension.  This can face  perhaps a 3% per year risk of thromboembolism.  This is a borderline  indication for chronic anticoagulation.  For now, we will continue and  adjust his warfarin therapy.   His basic laboratory tests were retrieved and appeared normal.  These  were obtained at the [facility] approximately 3 months ago.  He has  not had a chest x-ray, which will be obtained.   Another possible etiology for the patient's near-syncope would be tachy  or brady arrhythmias.  His diltiazem appears to be preventing the  former.  His event monitoring will allow [place] to search for the latter.    Sincerely,      Gregory Friends. Dietrich Pates, MD, [facility]  Electronically Signed    RMR/MedQ  DD: 03/25/2008  DT: 03/26/2008  Job #: [id number]

## 2011-01-31 NOTE — Consult Note (Signed)
NAME:  [name], WICHERT NO.:  [id number]   MEDICAL RECORD NO.:  [id number]          PATIENT TYPE:  OIB   LOCATION:  3707                         FACILITY:  [facility]   PHYSICIAN:  Duke Salvia, MD, FACCDATE OF BIRTH:  1941/09/07   DATE OF CONSULTATION:  DATE OF DISCHARGE:                                 CONSULTATION   We were asked to see Mr. Garside in consultation regarding pacemaker  implantation for tachybrady syndrome.   He is a 70 year old gentleman with no known significant heart disease  with a negative recent Myoview and normal left ventricular systolic  function and an echo notable only for mild hypertrophy who was found to  have atrial fibrillation in the beginning of the summer.  He was started  on Coumadin therapy.  He has had some problems with fatigue, exercise  intolerance, and some dizziness.  This dizziness has been primarily  positional upon standing.  An event recorder was undertaken by Dr.  Dietrich Pates, which demonstrated wide ranges in heart rate with pauses of  greater than 2 seconds, heart rates in the 30s in sinus rhythm as well  as atrial fibrillation at rates up to 170.  He is referred for  consideration of pacemaker implantation.   His thromboembolic risk factors are notable for hypertension.   He does have a history of sleep apnea for which he takes CPAP.   His past medical history in addition to the above is notable for GE  reflux disease.   His past surgical history is notable for a titanium plate in his neck  secondary to an accident.   His medications currently include:  1. Diltiazem 420.  2. Omeprazole 20.  3. Hydrochlorothiazide 25.  4. Potassium 20.  5. Warfarin.  6. Losartan 100.  7. Flomax 0.4.   He has a cutaneous allergy to PENICILLIN, but he has taken Keflex in the  interim.   His review of systems apart from the above is broadly negative across  multiple organ systems.   SOCIAL HISTORY:  He lives alone.  He  has 3 daughters and 1 grandchild.  He does not use cigarettes, alcohol, or recreational drugs.   PHYSICAL EXAMINATION:  GENERAL:  He is an older Caucasian male appearing  his stated age of 88.  VITAL SIGNS:  His blood pressure is mildly elevated at 142/74, the pulse  is 60.  He was afebrile and in no acute distress.  HEENT:  Demonstrated no icterus and no xanthomata.  NECK:  Neck veins were flat.  His carotids were brisk and full with a  left bruit.  BACK:  Without kyphosis or scoliosis.  LUNGS:  Clear.  HEART:  Heart sounds were regular without murmurs or gallops.  ABDOMEN:  Soft with active bowel sounds without midline pulsation or  hepatomegaly.  EXTREMITIES:  Femoral pulses were 2+.  Distal pulses were intact.  There  was no clubbing, cyanosis, or edema.  NEUROLOGIC:  Grossly normal.  SKIN:  A little bit ruddy in the face but otherwise is warm and dry.   Rhythm strips were as previously noted, with atrial  fibrillation with  rates in the 150s, pauses in atrial fibrillation of more than 2 seconds,  and sinus heart rates in the high 30s and low 40s.   IMPRESSION:  1. Tachybrady syndrome.      a.     Heart rates in the 30s and 40s in sinus.      b.     Atrial fibrillation up to the 170 range.      c.     Pauses of greater than 2 seconds.  2. CHADS score of 1 for hypertension.  3. Normal left ventricular function, nonischemic Myoview.  4. Obstructive sleep apnea on continuous positive airway pressure.  5. Left carotid bruit.  6. Orthostatic lightheadedness.   DISCUSSION:  Mr. Zuercher has tachybrady syndrome manifested as  described above.  Notwithstanding moderately aggressive rate control,  his problems with rapid rates persist; his bradycardia has precluded  further up-titration of rate-controlling medications.   Pacemaker implantation is reasonable to assist in control of his rhythm  and rate.  Rate control certainly would be the next step, and at  his  young age rhythm  control is certainly worth considering relatively early  on as well, especially as we await long-term-outcome data from  comparative trials of rhythm control versus catheter ablation, which we  hope would be positive in regards to the latter.   Contributing issues include his sleep apnea, and it is important that  his CPAP be reassessed and titrated appropriately as there is strong  linkage between sleep apnea, hypertension, and atrial fibrillation.   Furthermore, it is worth consideration of Coumadin as an anticoagulant  given his [place] score of 1.  Coumadin and aspirin are both recommended as  alternatives here, and I will defer that to Dr. Dietrich Pates and Dr. Ouida Sills.   Carotid Dopplers will be appropriately obtained for his left carotid  bruit.   Orthostatic intolerance may be aggravated by his diuretics as well as  his alpha blocker for his prostatism.  It may be that one or both of  these drugs can be adjusted to diminish his orthostatic intolerance.   RECOMMENDATIONS:  Based on the above, we will therefore:  1. Proceed with pacemaker implantation.  I have reviewed with the      patient and his daughter the potential benefits as well as      potential risks including but not limited to death, perforation of      the lung and/or heart, infection, and lead dislodgement.  They      understand these risks and are willing to proceed.  2. Consider outpatient assessment of CPAP therapy.  3. Carotid Dopplers.  4. Consideration of adjustment of his diuretics/alpha blockers to      minimize his orthostatic intolerance.   Thank you for the consultation.      Duke Salvia, MD, [facility]  Electronically Signed     SCK/MEDQ  D:  05/21/2008  T:  05/22/2008  Job:  [id number]   cc:   Kingsley Callander. Ouida Sills, MD

## 2011-01-31 NOTE — Letter (Signed)
October 13, 2008    Kingsley Callander. Ouida Sills, MD  [address]  [place], [place] [address]   RE:  LINN, GOETZE  MRN:  [id number]  /  DOB:  [birth year]   Dear Channing Mutters,   Mr. Hocker returns to the office as scheduled for continued assessment  and treatment of sick sinus syndrome.  As anticipated stopping treatment  with warfarin; however, he was seen by Dr. Ladona Ridgel in December who took  that action.  His only anticoagulation now is aspirin 325 mg daily.   He has done fairly well in recent months.  He continues to feel like he  has to take a deep breath at times and might be somewhat short of  breath.  He has had no lightheadedness.  Unfortunately, during the  course of URI, he aspirated some salt water, which resulted in a  paroxysm of coughing and loss of consciousness.  He suffered head trauma  and was seen in the emergency department on that occasion, but no  significant abnormalities were identified.  He has done well since.  His  pacemaker was interrogated in December.  Atrial fibrillation was present  only 2% of the time, or for a total of approximately 10 minutes per day  on the average.   PHYSICAL EXAMINATION:  GENERAL:  On exam, pleasant overweight gentleman  in no acute distress.  VITAL SIGNS:  The weight is 219, 5 pounds less than in December.  Blood  pressure 140/70, heart rate 60 and regular, respirations 14.  NECK:  No jugular venous distention; no carotid bruits.  LUNGS:  Decreased breath sounds at bases; otherwise clear.  CARDIAC:  Normal first and second heart sounds.  ABDOMEN:  Soft and nontender; no organomegaly.  EXTREMITIES:  No edema.   IMPRESSION:  Mr. Milley is doing well with current medications and  pacing.  His risk of thromboembolism continues to appear quite low.  It  was fortunate that he was not anticoagulated when he experienced syncope  and head trauma.  We will continue his current regime and plan a return  office visit in 6 months.     Sincerely,      Gerrit Friends. Dietrich Pates, MD, [facility]  Electronically Signed    RMR/MedQ  DD: 10/13/2008  DT: 10/14/2008  Job #: [id number]

## 2011-01-31 NOTE — Discharge Summary (Signed)
NAME:  [name], [name] NO.:  [id number]   MEDICAL RECORD NO.:  [id number]          PATIENT TYPE:  OIB   LOCATION:  3707                         FACILITY:  [facility]   PHYSICIAN:  Duke Salvia, MD, FACCDATE OF BIRTH:  [birth year]   DATE OF ADMISSION:  05/21/2008  DATE OF DISCHARGE:  05/22/2008                               DISCHARGE SUMMARY   PRIMARY CARDIOLOGIST:  Gerrit Friends. Dietrich Pates, MD, [facility]   PRIMARY CARE PHYSICIAN:  Kingsley Callander. Ouida Sills, MD   ELECTROPHYSIOLOGIST:  Duke Salvia, MD, [facility]   PROCEDURES PERFORMED DURING HOSPITALIZATION:  Implantation of dual-  chamber Zephyr XL DR [id number] pacemaker, serial [id number], per Dr. Sherryl Manges on May 21, 2008.   FINAL DISCHARGE DIAGNOSES:  1. Tachybradycardia syndrome.      a.     Heart rate 30s-40s in sinus.      b.     Atrial fibrillation up to 170 range.      c.     Pauses greater than 2 seconds.  2. CHADS score of 1 for hypertension (consideration to change to      aspirin versus Coumadin in this setting).  3. Normal left ventricular function with nonischemic Myoview.  4. Obstructive sleep apnea, on CPAP.  5. Left carotid bruit.  6. Orthostatic lightheadedness.   HOSPITAL COURSE:  This is a 70 year old Caucasian male with no  significant heart disease and negative stress Myoview with normal left  ventricular systolic function with a history of atrial fibrillation  which was diagnosed in May 2009 and was started on Coumadin therapy.  The patient had an event recorder undertaken by Dr. Dietrich Pates which  demonstrated tachy-brady syndrome and pauses.  As a result of this, the  patient was referred to Dr. Sherryl Manges for consideration for pacemaker  implantation.   Dr. Graciela Husbands did see the patient in his office on May 21, 2008, and  the patient was planned for pacemaker implantation.  The patient's  Coumadin was held and discussion with Dr. Dietrich Pates was had by Dr. Graciela Husbands  concerning need to continue Coumadin versus  aspirin therapy because his  CHADS score was 1.  Dr. Graciela Husbands stated that the Coumadin and aspirin are  both recommended as alternatives and will refer further need for  Coumadin at the discretion of Dr. Dietrich Pates.   The patient was also found to have a left carotid bruit and on  discharge, the patient will have a followup carotid ultrasound for  further evaluation of this.   The patient did undergo pacemaker implantation without incident.  There  was no evidence of hematoma, bleeding, or signs of infection post  pacemaker insertion.  The patient recovered well with some soreness at  the pacemaker site.  On discharge, the patient was seen and examined by  Dr. Charlton Haws and advised for discharge.  The patient has been given  post pacemaker implantation instructions along with medications not to  be changed prior to admission.  The patient will be restarted on  Coumadin after discussion with Dr. Graciela Husbands in the interim until followed  by Dr. Dietrich Pates.  The patient  does have a followup Coumadin Clinic  appointment on May 26, 2008.   LABORATORY DATA ON DISCHARGE:  None.   DISCHARGE VITAL SIGNS:  Blood pressure 127/71, pulse 60, respirations  19, temperature 98.2, and O2 saturation 94% on room air.   DISCHARGE MEDICATIONS:  1. Metoprolol 25 mg twice a day.  2. Protonix 40 mg daily.  3. Diltiazem 420 mg daily.  4. Cozaar 100 mg daily.  5. Flomax 0.4 mg at bedtime.  6. Tylenol with Codeine 2 tablets q.4 h p.r.n. pain.  7. Coumadin at prior admission dose, to be followed by Coumadin Clinic      on May 26, 2008, at 9:45.  8. HCTZ 25 mg daily.  9. Potassium 20 mEq daily.   ALLERGIES:  PENICILLIN.   FOLLOWUP PLANS AND APPOINTMENTS:  1. The patient is scheduled for followup Coumadin Clinic appointment      on May 26, 2008, at 9:45 a.m.  2. The patient is to follow up in the Pacemaker Clinic in [place]      on June 10, 2008, at 9:40 a.m.  3. The patient is to follow  up with Dr. [place] Bing on Friday,      June 05, 2008, at 10:45 a.m.  Discussion concerning need to      continue Coumadin versus aspirin will be done at that time.  4. The patient is to follow up with Dr. Sherryl Manges on August 25, 2008, at 3:00 p.m.  5. The patient will be scheduled for carotid Doppler studies.  The      [place] office will call him at home to schedule this at [facility] prior to the follow up with Dr. Dietrich Pates.  6. The patient has been given post pacemaker instructions with      particular emphasis on the incision site for evidence of bleeding,      hematoma, or signs of infection.   Time spent with the patient to include physician time 45 minutes.      Bettey Mare. Lyman Bishop, NP      Duke Salvia, MD, [facility]  Electronically Signed    KML/MEDQ  D:  05/22/2008  T:  05/22/2008  Job:  [id number]   cc:   Kingsley Callander. Ouida Sills, MD

## 2011-01-31 NOTE — Letter (Signed)
June 23, 2008    Kingsley Callander. Ouida Sills, MD  [address]  [place], [place]  [address]   RE:  [name], [name]  MRN:  [id number]  /  DOB:  31-Aug-1941   Dear Channing Mutters:   Mr. Antonelli returns to the office for continued assessment treatment of  sick sinus syndrome.  Since implantation of a dual-chamber pacemaker in  September, his symptoms have virtually resolved.  He has had a rare  episode of palpitation with some mild dizziness.  He believes his heart  rate sometimes is below 60 based upon his blood pressure device  readings.  He usually obtains a rate of 60 when he takes his own pulse.  Blood pressure control has been good.  His wound has healed well.  His  medications are unchanged from his last visit.   PHYSICAL EXAMINATION:  GENERAL:  Pleasant gentleman in no acute  distress.  VITAL SIGNS:  The weight is 224, 8 pounds more than in July.  Blood  pressure 125/65, heart rate 60 and regular, respirations 14.  NECK:  No  jugular venous distention; normal carotid upstrokes without bruits.  LUNGS:  Clear.  THORAX:  A well-healed incision in the left infraclavicular region.  CARDIAC:  Normal first and second heart sounds.  ABDOMEN:  Soft and nontender; no organomegaly.  EXTREMITIES:  No edema.   IMPRESSION:  Mr. Oliff is doing very well overall.  The risk, benefit  ratio of chronic anticoagulation is a bit uncertain.  His only risk  factor for thromboembolism is his hypertension, although his age.  He is  considered a factor in some grading systems.  We will proceed with a  transesophageal echocardiogram to assess for other markers of increased  thromboembolic risk.  In 2 months, he will be reevaluated by the electrophysiology service  with further information as to the frequency and duration of episodes of  atrial fibrillation.  At that point, we can decide whether or not to  continue warfarin indefinitely.  Overall, he is doing quite well.  Stool  for hemoccult testing has been negative.   CBC has been normal.    Sincerely,      Gerrit Friends. Dietrich Pates, MD, [facility]  Electronically Signed    RMR/MedQ  DD: 06/23/2008  DT: 06/24/2008  Job #: [id number]

## 2011-01-31 NOTE — Op Note (Signed)
NAME:  [name], [name] NO.:  [id number]   MEDICAL RECORD NO.:  [id number]          PATIENT TYPE:  OIB   LOCATION:  3707                         FACILITY:  [facility]   PHYSICIAN:  Hillis Range, MD       DATE OF BIRTH:  1941/06/16   DATE OF PROCEDURE:  DATE OF DISCHARGE:                               OPERATIVE REPORT   PRIMARY SURGEON:  Duke Salvia, MD, [facility].   FIRST ASSISTANT:  Hillis Range, MD.   PREPROCEDURE DIAGNOSES:  1. Sinus node dysfunction.  2. Symptomatic bradycardia.  3. Atrial fibrillation with tachycardia-bradycardia syndrome.   POSTPROCEDURE DIAGNOSES:  1. Sinus node dysfunction.  2. Symptomatic bradycardia.  3. Atrial fibrillation with tachycardia-bradycardia syndrome.   PROCEDURES:  Dual-chamber pacemaker implantation with fluoroscopic  visualization.   DESCRIPTION OF THE PROCEDURE:  Informed and written consent was  obtained, and the patient was brought to the electrophysiology lab in a  fasting state.  He was adequately sedated with intravenous medications  as outlined in the nursing report.  The patient's left chest was prepped  and draped in the usual sterile fashion by the EP lab staff.  A 5-cm  incision was made over the left deltoid pectoral groove and a pacemaker  pocket was fashioned using a combination of blunt and sharp dissection.  Electrocautery was used to assure hemostasis.  Using a modified  Seldinger technique, the left axillary vein was accessed.  With  fluoroscopic visualization, a [employer] Jude medical Tendril SDX (serial  [id number]), Model [id number] right atrial lead and a [employer] Jude medical  Tendril SDX, Model [id number] (serial [id number]) right ventricular lead  were advanced through the left axillary vein and to the right atrial  appendage and right ventricular apex positions respectively.  Initially,  the measurements revealed an atrial lead P-wave of 4 millivolts with an  impendence of 931 ohms and a threshold of 0.9 volts  at 0.5 milliseconds.  The right ventricular lead R-wave measures 6 millivolts with an  impendence of 764 ohms and threshold of 0.9 volts at 0.5 milliseconds.  Each lead was actively fixed to the myocardium in these locations.  The  leads were then secured to the pectoralis fascia using 2-0 silk suture.  A hemostatic stitch was applied with 0 silk suture.  The leads were then  connected to a St. Jude medical [place], [place] [address]409 [phone]) dual-chamber pacemaker.  The pacemaker was placed into the  pocket and fixed to the pectoralis fascia with a single 2-0 silk suture.  The pocket was irrigated with copious gentamicin solution.  The pocket  was then closed in 2 layers with 2-0 Vicryl suture for the subcutaneous  and subcuticular layers.  Steri-Strips and sterile bandage were then  applied.  There were no early apparent complications.   CONCLUSIONS:  1. Successful dual-chamber pacemaker implanted.  2. No early apparent complications.      Hillis Range, MD  Electronically Signed     JA/MEDQ  D:  05/21/2008  T:  05/22/2008  Job:  [id number]   cc:   Gerrit Friends. Dietrich Pates, MD, [facility]  Channing Mutters  Marvene Staff, MD

## 2011-01-31 NOTE — Letter (Signed)
March 25, 2008     RE:  [name], [name]  MRN:  [id number]  /  DOB:  October 25, 1940   Comparison to prior tracing obtained on March 23, 2008:  Atrial flutter is  no longer present.  R-wave progression was previously normal.   IMPRESSION:  Mr. Proehl has nonspecific symptoms that would be  consistent with orthostatic hypotension, but no change in blood pressure  with standing is documented in the office today.  He also has symptoms  that sound like exertional presyncope.  He has no IHSS or valvular  disease account for this.  I have some concern about exercise-induced  ischemia causing his symptoms.  We will proceed with a stress Myoview  study at the patient's earliest convenience.   He had a symptomatic atrial flutter when he was seen in your office.  He  will carry an event recorder for 21 days to evaluate the frequency and  duration of his arrhythmia.   He has moderate risk for thromboembolic disease in the setting of atrial  arrhythmias.  His notable risk factor is hypertension.  This can face  perhaps a 3% per year risk of thromboembolism.  This is a borderline  indication for chronic anticoagulation.  For now, we will continue and  adjust his warfarin therapy.   His basic laboratory tests were retrieved and appeared normal.  These  were obtained at the [facility] approximately 3 months ago.  He has  not had a chest x-ray, which will be obtained.   Another possible etiology for the patient's near-syncope would be tachy  or brady arrhythmias.  His diltiazem appears to be preventing the  former.  His event monitoring will allow [place] to search for the latter.    Sincerely,      Gerrit Friends. Dietrich Pates, MD, [facility]    RMR/MedQ  DD: 03/25/2008  DT: 03/26/2008  Job #: [id number]

## 2011-01-31 NOTE — Assessment & Plan Note (Signed)
[name]                         ELECTROPHYSIOLOGY OFFICE NOTE   [name], [name]                    MRN:          [id number]  DATE:08/26/2008                            DOB:          09-29-1940    [name] returns today for followup.  He is very pleasant, middle-  aged man with symptomatic tachy-brady syndrome and atrial fibrillation  and flutter.  He is status post pacemaker insertion secondary to all the  above in the setting of pauses and symptomatic bradycardia.  The patient  returns today for followup and is doing well.  He has recently undergone  transesophageal echo because of possibility of stopping his Coumadin  therapy.  He of course has hypertension, but no other CHADS risk factor.   MEDICATIONS:  1. Coumadin as directed.  2. Diltiazem 420 a day.  3. Metoprolol 20 a day.  4. Hydrochlorothiazide 25 a day.  5. Potassium 20 a day.  6. Losartan 100 a day.  7. Metoprolol 12.5 twice a day.   PHYSICAL EXAMINATION:  GENERAL:  He is a pleasant, well-appearing,  middle-aged man in no distress.  VITAL SIGNS:  Blood pressure today was 120/68, the pulse 70 and regular,  the respirations were 18, the weight was 224 pounds.  NECK:  No jugular venous distention.  LUNGS:  Clear bilaterally to auscultation.  No wheezes, rales, or  rhonchi are present.  CARDIOVASCULAR:  Regular rate and rhythm.  Normal S1 and S2.  ABDOMEN:  Soft, nontender.  EXTREMITIES:  No edema.   Interrogation of his pacemaker data demonstrates a Engineer, structural.  The  P-waves were 3, the R-waves were greater than 12, impedance 500 in the  A, 856 in the V, threshold 0.5 at 0.5 both in the right atrium and the  right ventricle.  The battery voltage was 2.78 volts.  He was in AFib 2%  of the time, 69% A paced.  Today, his outputs were decreased to 2 at 0.5  in the atrium and 2.5 at 0.5 in the RV to maximize battery longevity.   IMPRESSION:  1. Symptomatic  tachy-brady syndrome.  2. Paroxysmal atrial fibrillation.  3. Coumadin therapy.   DISCUSSION:  Overall, [name] is stable.  We have asked that he  discontinue his Coumadin today and I have confirmed about this with  regard to stopping it with Dr. Dietrich Pates.  The left atrium basically is  normal.  His normal emptying velocity is in the atrial appendage.  His  LV function is normal and with all the above his [place] risk factor is 1.  He will go on full-strength  aspirin and I will see him back in a year for pacemaker followup.  He  will follow up with Dr. Dietrich Pates as previously scheduled.     [name], MD  Electronically Signed    GWT/MedQ  DD: 08/26/2008  DT: 08/26/2008  Job #: [id number]   cc:   Kingsley Callander. Ouida Sills, MD

## 2011-02-03 NOTE — Procedures (Signed)
NAME:  [name], [name] NO.:  [id number]   MEDICAL RECORD NO.:  [id number]          PATIENT TYPE:  OUT   LOCATION:  SLEEP LAB                     FACILITY:  [facility]   PHYSICIAN:  Barbaraann Share, MD,FCCPDATE OF BIRTH:  05-Feb-1941   DATE OF STUDY:  11/05/2006                            NOCTURNAL POLYSOMNOGRAM   INDICATIONS:  Hypersomnia with sleep apnea.  Epworth score was 12.   SLEEP ARCHITECTURE:  The patient had total sleep time of 338 minutes  with adequate slow wave sleep but decreased REM.  Sleep onset latency  was mildly prolonged at 32 minutes and REM onset was mildly prolonged at  107 minutes.  Sleep efficiency was decreased at 80%.   RESPIRATORY DATA:  The patient underwent split night protocol where he  was found to have 86 central and obstructive events in the first 104  minutes of sleep.  This gave him an extrapolated respiratory disturbance  index of 50 events per hour and he was also noted to have very loud  snoring throughout.  Events were not positional.  By protocol, the  patient was then placed on a small/medium comfort select nasal CPAP mask  and ultimately titrated to a final pressure of 8 cm.  There was  excellent control on this setting and REM rebound was noted.   OXYGEN DATA:  His O2 desaturation as low as 81% with the patient's  obstructive events.   CARDIAC DATA:  No clinically significant cardiac arrhythmias.   MOVEMENT/PARASOMNIA:  The patient was found to have 147 leg jerks with  two per hour resulting in arousal or awakening.   IMPRESSION/RECOMMENDATIONS:  1. Split night study reveals severe obstructive sleep apnea with an      apnea hypopnea index of 50 events per hour and O2 desaturation as      low as 81% during the first half of the night.  The patient was      then placed on CPAP with a small/medium comfort select nasal CPAP      mask and titrated to a final pressure of 8 cm with an excellent      response.  2. Large numbers  of leg jerks with significant sleep disruption.  It      is unclear how much of this is related to the      patient's sleep disorder breathing, or whether or not the patient      may have a concomitant primary movement disorder of sleep.      Clinical correlation is suggested after an appropriate trial of      optimal CPAP.      Barbaraann Share, MD,FCCP  Diplomate, American Board of Sleep  Medicine  Electronically Signed     KMC/MEDQ  D:  11/20/2006 16:40:15  T:  11/21/2006 06:22:46  Job:  [id number]

## 2011-02-03 NOTE — Op Note (Signed)
NAME:  [name], [name]             ACCOUNT NO.:  [id number]   MEDICAL RECORD NO.:  [id number]          PATIENT TYPE:  INP   LOCATION:  3005                         FACILITY:  [facility]   PHYSICIAN:  Coletta Memos, M.D.     DATE OF BIRTH:  [birth year]   DATE OF PROCEDURE:  10/06/2005  DATE OF DISCHARGE:                                 OPERATIVE REPORT   PREOPERATIVE DIAGNOSIS:  1.  Cervical spondylosis C5-6, C6-7.  2.  Cervical radiculopathy.   POSTOPERATIVE DIAGNOSES:  1.  Cervical spondylosis C5-6, C6-7.  2.  Cervical radiculopathy.   PROCEDURE:  1.  Anterior cervical decompression C5-6, C6-7.  2.  Arthrodesis using Peak interbody cages 6 mm in C5-6, 7 mm of C6-7 with      Vitoss bone putty placed.  3.  Anterior instrumentation using a Synthes ACCS plate.   COMPLICATIONS:  None.   SURGEON:  Cabbell.   ASSISTANT:  Venetia Maxon.   The Peak interbody cages are Synthes ACS interbodies.   ANESTHESIA:  General endotracheal.   INDICATIONS:  [name] is a gentleman whom I have followed for some  time now who has significant spondylitic disease in the cervical spine. He  says he is at the point now where the pain is too much for him to deal with  on a constant basis. He is therefore agreed to undergo operative  decompression.   OPERATIVE NOTE:  Ms. Crochet was brought to the operating room intubated,  placed under a general anesthetic without difficulty. He was positioned with  his head in slight extension on horseshoe headrest. His neck was prepped and  he was draped in a sterile fashion. I opened his skin after infiltrating 6  mL of 0.5% lidocaine 1:20,000 epinephrine starting from the midline  extending to the medial border of left sternocleidomastoid down to the  platysma. After opening the skin. I then dissected rostrally and caudally  above the platysma. The platysma was opened and then I dissected rostrally  and caudally inferior to the platysma. I was able identify the  sternocleidomastoid and medial strap muscles and then the carotid artery on  the left side. I retracted the carotid artery and sternocleidomastoid  laterally. The strap muscles medially. I then identified the anterior  cervical spine, and placed a spinal needle and it showed that I was at C5-6.  I then used monopolar cautery to reflect the longus colli muscles  bilaterally. I placed a self-retaining retractor. I then placed two  distraction pins, one at C5, the other at C6. I had already opened the small  portion of the disk at C5-6. I then took a 15 blade and continued to the  opening of the disk. Then using a combination of high-speed drill, curettes,  pituitary rongeurs and Kerrison punches, I removed both osteophytes and disk  material until I had I decompressed the area quite well. I also decompressed  both C6 nerve roots. After satisfying myself with the decompression, I then  placed a 6 mm Peak ACS cage filled with Vitoss bone putty which had been  soaked in saline. The cage was  placed without difficulty. I placed some  Gelfoam alongside to aid in hemostasis. I then removed the distraction pin  at C5 and placed it at C7. That disk was opened with a #15 blade. Disk  material removed. I then distracted the disk space and again using a  combination of microscopic dissection, Kerrison punches, curettes and a high-  speed drill, I removed again both osteophytes and very thickened posterior  longitudinal ligament. I removed that ligament until I was looking at bare  dura. I then decompressed both C7 nerve roots. This was done without great  difficulty. I then placed a 7-mm cage at this level. After preparing the  endplate for arthrodesis. The end plates were also prepared for arthrodesis  with the high-speed drill at C5-6. After preparing the endplates for  arthrodesis. I then placed a cage filled with the cross putty. I then  achieved hemostasis with Dr. Fredrich Birks assistance.  I then removed  the  distraction pins. We then prepared the space for anterior plating. Two  screws were placed in C5, two in C6, two in C7. Each hole was first drilled  using a hand drill and then using self-tapping screws, the plate was placed  without difficulty. X-ray showed the plate to be in the proper position and  levels. I then irrigated the wound. I controlled some minor venous bleeding  points in the muscle. I then closed with wound in layered fashion using  Vicryl sutures to reapproximate the platysma and subcutaneous tissue.  Dermabond used in the skin edges.           ______________________________  Coletta Memos, M.D.     KC/MEDQ  D:  10/06/2005  T:  10/07/2005  Job:  [id number]

## 2011-02-03 NOTE — Procedures (Signed)
   NAME:  [name], [name]                       ACCOUNT NO.:  [id number]   MEDICAL RECORD NO.:  [id number]                   PATIENT TYPE:  OUT   LOCATION:  RAD                                  FACILITY:  [facility]   PHYSICIAN:  Kingsley Callander. Ouida Sills, M.D.                  DATE OF BIRTH:  1940/12/11   DATE OF PROCEDURE:  DATE OF DISCHARGE:  06/16/2002                                    STRESS TEST   The patient exercised 9 minutes 52 seconds (52 seconds into stage IV of the  Bruce protocol) obtaining a maximal heart rate of 144 (90% of the age-  predicted maximal heart rate) at a workload of 12.9 METS and discontinued  exercise due to fatigue.  There were no symptoms of chest pain.  There were  infrequent atrial and ventricular premature complexes.  There was 1 mm ST  segment depression during late exercise which was not consistently exhibited  on tracings during recovery.  The baseline electrocardiogram revealed normal  sinus rhythm at 62 beats per minute with inferolateral T-wave inversions.  His initial blood pressure was elevated at 150/68.  He reached a peak blood  pressure of 200/60.   IMPRESSION:  Borderline exercise stress test.  Cardiolite images pending.                                               Kingsley Callander. Ouida Sills, M.D.    ROF/MEDQ  D:  06/18/2002  T:  06/19/2002  Job:  [id number]

## 2011-03-01 ENCOUNTER — Encounter: Payer: Self-pay | Admitting: Cardiology

## 2011-03-27 ENCOUNTER — Encounter: Payer: Self-pay | Admitting: Cardiology

## 2011-03-28 ENCOUNTER — Encounter: Payer: Self-pay | Admitting: Cardiology

## 2011-03-28 ENCOUNTER — Ambulatory Visit (INDEPENDENT_AMBULATORY_CARE_PROVIDER_SITE_OTHER): Payer: Medicare Other | Admitting: Cardiology

## 2011-03-28 DIAGNOSIS — I679 Cerebrovascular disease, unspecified: Secondary | ICD-10-CM

## 2011-03-28 DIAGNOSIS — R42 Dizziness and giddiness: Secondary | ICD-10-CM

## 2011-03-28 DIAGNOSIS — Z950 Presence of cardiac pacemaker: Secondary | ICD-10-CM

## 2011-03-28 DIAGNOSIS — M545 Low back pain, unspecified: Secondary | ICD-10-CM

## 2011-03-28 DIAGNOSIS — E785 Hyperlipidemia, unspecified: Secondary | ICD-10-CM

## 2011-03-28 DIAGNOSIS — R079 Chest pain, unspecified: Secondary | ICD-10-CM

## 2011-03-28 MED ORDER — LOSARTAN POTASSIUM 25 MG PO TABS: 25.0000 mg | ORAL_TABLET | Freq: Every day | ORAL | Status: DC

## 2011-03-28 NOTE — Assessment & Plan Note (Signed)
Patient remains free of symptoms attributable to conduction system disease.

## 2011-03-28 NOTE — Assessment & Plan Note (Addendum)
Patient continues to experience mild orthostatic symptoms in the absence of a significant change in blood pressure in office.  Blood pressure in the 90s systolic has been associated with his symptoms.  Cozaar dose will be decreased to 25 mg q.d.  Patient will call for elevated blood pressure values above 150/90.  If blood pressure control remains good, and symptoms improve, I will plan to see this nice gentleman again in one year.

## 2011-03-28 NOTE — Progress Notes (Signed)
HPI : Patient was admitted to [facility] a few weeks ago with anterior chest discomfort associated with meals.  Etiology was apparently thought to be GI in origin, and patient has improved with an increase in his dose of omeprazole.  Cardiology evaluation and hospital was reportedly negative.  Records of that admission have been requested but not yet received.  Patient has had minimal recurrent chest discomfort since discharge.  He has also been troubled by mild orthostatic lightheadedness that has, at times, forced him to sit when he becomes symptomatic while standing.  He denies falls or loss of consciousness.  Blood pressure has been as low as 90 systolic when he is lightheaded.  His weight recently decreased when he was started on an antiseizure medication for treatment of chronic pain.  Current Outpatient Prescriptions on File Prior to Visit  Medication Sig Dispense Refill  . aspirin (ECOTRIN) 325 MG EC tablet Take 325 mg by mouth daily.        Marland Kitchen diltiazem (TIAZAC) 420 MG 24 hr capsule Take 420 mg by mouth daily.        Marland Kitchen HYDROcodone-acetaminophen (VICODIN) 5-500 MG per tablet Take 1 tablet by mouth. As needed for pain       . metoprolol tartrate (LOPRESSOR) 25 MG tablet Take 25 mg by mouth as directed. Take 1/2 tab bid       . Omeprazole 20 MG TBEC Take 20 mg by mouth 2 (two) times daily.       . Tamsulosin HCl (FLOMAX) 0.4 MG CAPS Take by mouth daily.        Marland Kitchen DISCONTD: losartan (COZAAR) 50 MG tablet Take 25 mg by mouth daily.       Marland Kitchen DISCONTD: pregabalin (LYRICA) 75 MG capsule Take 75 mg by mouth 2 (two) times daily.           Allergies  Allergen Reactions  . Penicillins     REACTION: rash      Past medical history, social history, and family history reviewed and updated.  ROS: See history of present illness.  PHYSICAL EXAM: BP 115/67  Pulse 70  Ht 5\' 9"  (1.753 m)  Wt 103.874 kg (229 lb)  BMI 33.82 kg/m2  SpO2 95% ; weight decreased 7 pounds since previous office visit;  no significant orthostatic change in blood pressure. General-Well developed; no acute distress Body habitus-mildly obese Neck-No JVD; no carotid bruits Lungs-clear lung fields; resonant to percussion Cardiovascular-normal PMI; normal S1 and S2 Abdomen-normal bowel sounds; soft and non-tender without masses or organomegaly Musculoskeletal-No deformities, no cyanosis or clubbing Neurologic-Normal cranial nerves; symmetric strength and tone Skin-Warm, no significant lesions Extremities-distal pulses intact; no edema  Laboratory:  CBC and CMet normal in 01/2010  ASSESSMENT AND PLAN:

## 2011-03-28 NOTE — Patient Instructions (Signed)
Your physician has recommended you make the following change in your medication: decrease Cozaar to 25mg  (1/2 tablet of 50mg ) daily  Your physician has recommended you call this office at [phone] for blood pressures greater than 150/90  Your physician recommends that you schedule a follow-up appointment in: 1 year

## 2011-03-28 NOTE — Assessment & Plan Note (Signed)
Patient remains free of neurologic symptoms.

## 2011-03-28 NOTE — Assessment & Plan Note (Signed)
Adequate control of hyperlipidemia when last assessed; repeat lipid profile will be obtained.

## 2011-03-28 NOTE — Assessment & Plan Note (Addendum)
Recent symptoms prompting observation stay at [facility] are most suggestive of a GI etiology and have improved substantially with an increase in his dose of omeprazole.  Supplemental antacids suggested as needed.  With a negative stress imaging study approximately one year ago, no further testing is warranted.

## 2011-03-29 ENCOUNTER — Telehealth: Payer: Self-pay | Admitting: Cardiology

## 2011-03-29 NOTE — Telephone Encounter (Signed)
Pt is to have a medtronic Tens that is compatible with pacemaker  But he needs to hold his aspirin 325mg  daily during treatment or decrease to 81mg  daily

## 2011-03-29 NOTE — Telephone Encounter (Signed)
Patient forgot to ask Dr.Rothbart about procedure that he was going to have by Dr.Bethea / wants to speak with nurse to ask if it would interfere with pacemaker / tg

## 2011-03-31 NOTE — Telephone Encounter (Signed)
Blood pressure issue on 03/29/11, currently taking diltiazem 420mg  dialy and cozaar 25mg  at hs

## 2011-04-03 ENCOUNTER — Encounter: Payer: Self-pay | Admitting: *Deleted

## 2011-04-03 NOTE — Telephone Encounter (Signed)
There is no problem with holding aspirin for device placement.

## 2011-04-03 NOTE — Telephone Encounter (Signed)
Spoke with pt gave clearance to wear medtronic tens unit with his pace maker Can hold aspirin during treatment per Dr. Dietrich Pates Clearance letter sent to Dr. Nickola Major

## 2011-04-07 ENCOUNTER — Encounter: Payer: Self-pay | Admitting: Cardiology

## 2011-04-27 ENCOUNTER — Other Ambulatory Visit: Payer: Self-pay | Admitting: Adult Health

## 2011-04-27 NOTE — Telephone Encounter (Signed)
Cook patient.   

## 2011-05-25 ENCOUNTER — Encounter: Payer: Self-pay | Admitting: Cardiology

## 2011-05-26 ENCOUNTER — Encounter: Payer: Self-pay | Admitting: Internal Medicine

## 2011-05-26 ENCOUNTER — Ambulatory Visit (INDEPENDENT_AMBULATORY_CARE_PROVIDER_SITE_OTHER): Payer: Medicare Other | Admitting: *Deleted

## 2011-05-26 DIAGNOSIS — I495 Sick sinus syndrome: Secondary | ICD-10-CM

## 2011-05-26 LAB — PACEMAKER DEVICE OBSERVATION
AL AMPLITUDE: 1 mv
BAMS-0001: 150 {beats}/min
BATTERY VOLTAGE: 2.79 V
BRDY-0002RV: 60 {beats}/min
RV LEAD AMPLITUDE: 12 mv

## 2011-05-26 NOTE — Progress Notes (Signed)
PPM check 

## 2011-06-13 ENCOUNTER — Ambulatory Visit (INDEPENDENT_AMBULATORY_CARE_PROVIDER_SITE_OTHER): Payer: Medicare Other | Admitting: Cardiology

## 2011-06-13 ENCOUNTER — Encounter: Payer: Self-pay | Admitting: Cardiology

## 2011-06-13 DIAGNOSIS — R42 Dizziness and giddiness: Secondary | ICD-10-CM

## 2011-06-13 DIAGNOSIS — Z87898 Personal history of other specified conditions: Secondary | ICD-10-CM

## 2011-06-13 DIAGNOSIS — I495 Sick sinus syndrome: Secondary | ICD-10-CM

## 2011-06-13 NOTE — Progress Notes (Signed)
HPI : Mr. Komar is a very pleasant 70 year old gentleman referred by our electrophysiology service for reconsideration of antithrombotic therapy.  He has sick sinus syndrome, and has done very well since pacemaker implantation a few years ago.  Recent assessment of that device showed very frequent mode switches with the patient spending more than 50% of his time in presumed atrial fibrillation.  Despite this, he remains essentially asymptomatic.  He has intermittent chest discomfort that does not appear to be of cardiac origin.  He occasionally experiences tachypalpitations, but these are very infrequent and not troublesome to him.  Orthostatic dizziness has improved with adjustment of his medication.  He has a history of hypertension, but has never been noted to have an elevated blood pressure.  He denies diabetes, previous stroke or TIA, congestive heart failure and has no known vascular disease.  A transesophageal echocardiogram performed in 2009 was interpreted by me as demonstrating low risk features for thromboembolism.  He has been maintained on aspirin ever since with no adverse reactions  Current Outpatient Prescriptions on File Prior to Visit  Medication Sig Dispense Refill  . aspirin (ECOTRIN) 325 MG EC tablet Take 325 mg by mouth daily.        Marland Kitchen diltiazem (TIAZAC) 420 MG 24 hr capsule Take 420 mg by mouth daily.        Marland Kitchen gabapentin (NEURONTIN) 300 MG capsule Take 600 mg by mouth 2 (two) times daily. 2 po bid      . HYDROcodone-acetaminophen (VICODIN) 5-500 MG per tablet Take 1 tablet by mouth. As needed for pain       . metoprolol tartrate (LOPRESSOR) 25 MG tablet TAKE ONE-HALF TABLET BY MOUTH TWICE DAILY  30 tablet  6  . Omeprazole 20 MG TBEC Take 20 mg by mouth 2 (two) times daily.       . Tamsulosin HCl (FLOMAX) 0.4 MG CAPS Take by mouth daily.           Allergies  Allergen Reactions  . Penicillins     REACTION: rash      Past medical history, social history, and family history  reviewed and updated.  ROS: See history of present illness  PHYSICAL EXAM: BP 112/75  Pulse 75  Resp 18  Ht 5\' 8"  (1.727 m)  Wt 106.142 kg (234 lb)  BMI 35.58 kg/m2  General-Well developed; no acute distress Body habitus-moderately overweight Neck-No JVD; no carotid bruits Lungs-clear lung fields; resonant to percussion Cardiovascular-normal PMI; normal S1 and S2; pulse was initially irregular, but subsequently regular; modest systolic ejection murmur Abdomen-normal bowel sounds; soft and non-tender without masses or organomegaly Musculoskeletal-No deformities, no cyanosis or clubbing Neurologic-Normal cranial nerves; symmetric strength and tone Skin-Warm, no significant lesions Extremities-distal pulses intact; no edema  ASSESSMENT AND PLAN:

## 2011-06-13 NOTE — Assessment & Plan Note (Signed)
The symptoms have improved with adjustment of his medical regime.  He still occasionally notes mild and brief orthostatic lightheadedness.

## 2011-06-13 NOTE — Assessment & Plan Note (Addendum)
Risk factors for thromboembolism including relatively advanced age and history of hypertension, although we have no documentation of elevated blood pressures.  Treatment for control of heart rate in atrial fibrillation may be providing adequate therapy for mild hypertension.  Risk of thromboembolism is low to moderate.  With additional information from his transesophageal echocardiogram, I feel relatively comfortable about continuing treatment with aspirin alone.  This will need to be reevaluated as additional anticoagulant drugs become available, hopefully with more favorable risk/benefit profiles, and as [name]'s age increases.

## 2011-06-13 NOTE — Patient Instructions (Signed)
Your physician recommends that you schedule a follow-up appointment in: as scheduled  

## 2011-06-20 LAB — BASIC METABOLIC PANEL
CO2: 28
Chloride: 101
Creatinine, Ser: 0.98
GFR calc Af Amer: >60
Potassium: 3.9

## 2011-06-20 LAB — COMPREHENSIVE METABOLIC PANEL
ALT: 22
AST: 23
Alkaline Phosphatase: 74
CO2: 25
GFR calc Af Amer: >60
GFR calc non Af Amer: >60
Glucose, Bld: 105 — ABNORMAL HIGH
Potassium: 3.6
Sodium: 137
Total Protein: 7.3

## 2011-06-20 LAB — DIFFERENTIAL
Basophils Relative: 1
Eosinophils Absolute: 0.1
Eosinophils Relative: 2
Monocytes Relative: 9
Neutrophils Relative %: 59

## 2011-06-20 LAB — PROTIME-INR
INR: 2.2 — ABNORMAL HIGH
INR: 2.3 — ABNORMAL HIGH
Prothrombin Time: 26.4 — ABNORMAL HIGH

## 2011-06-20 LAB — POCT CARDIAC MARKERS: Troponin i, poc: <0.05

## 2011-06-20 LAB — CBC
HCT: 42.3
Hemoglobin: 14.4
MCV: 91.6

## 2011-11-14 ENCOUNTER — Other Ambulatory Visit: Payer: Self-pay | Admitting: Adult Health

## 2011-11-21 ENCOUNTER — Encounter: Payer: Self-pay | Admitting: Internal Medicine

## 2011-11-21 ENCOUNTER — Ambulatory Visit (INDEPENDENT_AMBULATORY_CARE_PROVIDER_SITE_OTHER): Payer: Medicare Other | Admitting: Internal Medicine

## 2011-11-21 DIAGNOSIS — I10 Essential (primary) hypertension: Secondary | ICD-10-CM | POA: Insufficient documentation

## 2011-11-21 DIAGNOSIS — Z950 Presence of cardiac pacemaker: Secondary | ICD-10-CM

## 2011-11-21 DIAGNOSIS — I495 Sick sinus syndrome: Secondary | ICD-10-CM

## 2011-11-21 LAB — PACEMAKER DEVICE OBSERVATION
AL AMPLITUDE: 2.7 mv
BAMS-0001: 150 {beats}/min
BAMS-0003: 70 {beats}/min
BRDY-0002RV: 60 {beats}/min
BRDY-0003RV: 120 {beats}/min
DEVICE MODEL PM: 1269181
RV LEAD AMPLITUDE: 12 mv
RV LEAD THRESHOLD: 0.625 V

## 2011-11-21 NOTE — Progress Notes (Signed)
HPI [name] returns today for followup. He is a pleasant 71 yo man with a h/o HTN, obesity and brady-tachy syndrome s/p PPM. He denies chest pain, sob, or peripheral edema. He is anxious today about his daughter who has had car trouble. No syncope. Allergies  Allergen Reactions  . Penicillins     REACTION: rash     Current Outpatient Prescriptions  Medication Sig Dispense Refill  . aspirin (ECOTRIN) 325 MG EC tablet Take 325 mg by mouth daily.        Marland Kitchen diltiazem (TIAZAC) 420 MG 24 hr capsule Take 420 mg by mouth daily.        Marland Kitchen gabapentin (NEURONTIN) 300 MG capsule Take 600 mg by mouth 2 (two) times daily. 2 po bid      . HYDROcodone-acetaminophen (VICODIN) 5-500 MG per tablet Take 1 tablet by mouth. As needed for pain       . metoprolol tartrate (LOPRESSOR) 25 MG tablet TAKE ONE-HALF TABLET BY MOUTH TWICE DAILY  30 tablet  6  . Omeprazole 20 MG TBEC Take 20 mg by mouth 2 (two) times daily.       . Tamsulosin HCl (FLOMAX) 0.4 MG CAPS Take by mouth daily.           Past Medical History  Diagnosis Date  . General weakness     +malaise, exercise intolerance, and near syncope  . Atrial flutter     w sick sinus syndrome; dual-chamber pacing initiated in 05/2007; normal echocardiogram in 2009  . Hypertension   . BPH (benign prostatic hyperplasia)   . Cerebrovascular disease     carotid bruit; no focal stenosis by carotid ultrasound in 05/2008  . Low back pain     history of traumax2    ROS:   All systems reviewed and negative except as noted in the HPI.   Past Surgical History  Procedure Date  . Cervical discectomy 2006  . Cataract extraction     Bilateral     No family history on file.   History   Social History  . Marital Status: Divorced    Spouse Name: N/A    Number of Children: N/A  . Years of Education: N/A   Occupational History  . Not on file.   Social History Main Topics  . Smoking status: Former Smoker -- 1.0 packs/day for .5 years    Types:  Cigarettes    Quit date: 03/27/1962  . Smokeless tobacco: Never Used  . Alcohol Use: No  . Drug Use: No  . Sexually Active: Not on file   Other Topics Concern  . Not on file   Social History Narrative   Divorced with 3 adult children. Semi retired.      BP 158/78  Pulse 83  Ht 5\' 8"  (1.727 m)  Wt 104.327 kg (230 lb)  BMI 34.97 kg/m2  Physical Exam:  Well appearing middle aged man, NAD HEENT: Unremarkable Neck:  No JVD, no thyromegally Lungs:  Clear with no wheezes, rales, or rhonchi. HEART:  Regular rate rhythm, no murmurs, no rubs, no clicks Abd:  soft, positive bowel sounds, no organomegally, no rebound, no guarding Ext:  2 plus pulses, no edema, no cyanosis, no clubbing Skin:  No rashes no nodules Neuro:  CN II through XII intact, motor grossly intact  DEVICE  Normal device function.  See PaceArt for details.   Assess/Plan:

## 2011-11-21 NOTE — Assessment & Plan Note (Signed)
His device is working normally. He will follow up in several months. 

## 2011-11-21 NOTE — Patient Instructions (Signed)
Your physician recommends that you schedule a follow-up appointment in: 1 year with Dr Ladona Ridgel and 6 months with [name] for device check

## 2011-11-21 NOTE — Assessment & Plan Note (Signed)
His blood pressure is elevated but he assures me that it has been well controlled at home.

## 2012-03-28 ENCOUNTER — Ambulatory Visit: Payer: Medicare Other | Admitting: Cardiology

## 2012-04-15 ENCOUNTER — Encounter: Payer: Self-pay | Admitting: Cardiology

## 2012-04-15 ENCOUNTER — Ambulatory Visit (INDEPENDENT_AMBULATORY_CARE_PROVIDER_SITE_OTHER): Payer: Medicare Other | Admitting: Cardiology

## 2012-04-15 ENCOUNTER — Encounter: Payer: Self-pay | Admitting: *Deleted

## 2012-04-15 VITALS — BP 117/74 | HR 70 | Ht 68.0 in | Wt 234.1 lb

## 2012-04-15 DIAGNOSIS — I10 Essential (primary) hypertension: Secondary | ICD-10-CM

## 2012-04-15 DIAGNOSIS — E785 Hyperlipidemia, unspecified: Secondary | ICD-10-CM

## 2012-04-15 DIAGNOSIS — I495 Sick sinus syndrome: Secondary | ICD-10-CM

## 2012-04-15 DIAGNOSIS — R42 Dizziness and giddiness: Secondary | ICD-10-CM

## 2012-04-15 NOTE — Assessment & Plan Note (Signed)
Most recent lipid profile available is from 2011; a repeat study will be obtained.

## 2012-04-15 NOTE — Assessment & Plan Note (Signed)
Blood pressure remains normal; diagnosis of hypertension preceded his relationship with me and is undocumented based upon my records.

## 2012-04-15 NOTE — Patient Instructions (Addendum)
Your physician recommends that you schedule a follow-up appointment in: 1 year  Make [place] aware if you are told you have high blood pressure  ADDENDUM to original instructions: 04/15/12  Lipid profile - Letter sent

## 2012-04-15 NOTE — Progress Notes (Deleted)
Name: KIEFER OPHEIM    DOB: Mar 14, 1941  Age: 71 y.o.  MR#: [id number]       PCP:  Carylon Perches, MD      Insurance: @PAYORNAME @   CC:   No chief complaint on file.   VS BP 110/64  Pulse 72  Ht 5\' 8"  (1.727 m)  Wt 234 lb 1.9 oz (106.196 kg)  BMI 35.60 kg/m2  Weights Current Weight  04/15/12 234 lb 1.9 oz (106.196 kg)  11/21/11 230 lb (104.327 kg)  06/13/11 234 lb (106.142 kg)    Blood Pressure  BP Readings from Last 3 Encounters:  04/15/12 110/64  11/21/11 158/78  06/13/11 112/75     Admit date:  (Not on file) Last encounter with RMR:  Visit date not found   Allergy Allergies  Allergen Reactions  . Penicillins     REACTION: rash  . Shellfish Allergy     Hives.     Current Outpatient Prescriptions  Medication Sig Dispense Refill  . aspirin (ECOTRIN) 325 MG EC tablet Take 325 mg by mouth daily.        Marland Kitchen diltiazem (TIAZAC) 420 MG 24 hr capsule Take 420 mg by mouth daily.        Marland Kitchen gabapentin (NEURONTIN) 300 MG capsule Take 600 mg by mouth 2 (two) times daily. 2 po bid      . HYDROcodone-acetaminophen (VICODIN) 5-500 MG per tablet Take 1 tablet by mouth. As needed for pain       . loratadine (CLARITIN) 10 MG tablet Take 10 mg by mouth at bedtime.      . metoprolol tartrate (LOPRESSOR) 25 MG tablet TAKE ONE-HALF TABLET BY MOUTH TWICE DAILY  30 tablet  6  . naproxen (NAPROSYN) 500 MG tablet Take 500 mg by mouth as needed.      . Omeprazole 20 MG TBEC Take 20 mg by mouth 2 (two) times daily.       . Tamsulosin HCl (FLOMAX) 0.4 MG CAPS Take by mouth daily.          Discontinued Meds:   There are no discontinued medications.  Patient Active Problem List  Diagnosis  . HYPERLIPIDEMIA  . LOW BACK PAIN, CHRONIC  . ORTHOSTATIC DIZZINESS  . Cerebrovascular disease  . CHEST PAIN  . BENIGN PROSTATIC HYPERTROPHY, MILD, HX OF  . Cardiac pacemaker in situ  . Sick sinus syndrome  . Hypertension    LABS No visits with results within 3 Month(s) from this visit. Latest known  visit with results is:  Office Visit on 11/21/2011  Component Date Value  . DEVICE MODEL PM 11/21/2011 [id number]   . [id number] 11/21/2011 Duke Salvia  M.D.   . Sherlon Handing 11/21/2011 Duke Salvia  M.D.   . PACEART TECH NOTES PM 11/21/2011                     Value:Pacemaker check in clinic. Normal device function. Thresholds, sensing, impedances consistent with previous measurements. Device programmed to maximize longevity. 81% mode switch, - coumadin.  1 high ventricular rates noted. Device programmed at                          appropriate safety margins. Histogram distribution appropriate for patient activity level. Device programmed to optimize intrinsic conduction.  Patient education completed.  ROV 6 months with the device clinic in [place].  Marland Kitchen ATRIAL PACING PM 11/21/2011 85   . VENTRICULAR PACING PM 11/21/2011 3.1   .  BATTERY VOLTAGE 11/21/2011 2.76   . AL IMPEDENCE PM 11/21/2011 442   . RV LEAD IMPEDENCE PM 11/21/2011 547   . AL AMPLITUDE 11/21/2011 2.7   . RV LEAD AMPLITUDE 11/21/2011 12   . RV LEAD THRESHOLD 11/21/2011 0.625   . BRDY-0001RV 11/21/2011 DDDR   . BRDY-0002RV 11/21/2011 60   . BRDY-0003RV 11/21/2011 120   . BRDY-0004RV 11/21/2011 130   . BRDY-0005RV 11/21/2011 Off   . BRDY-0007RV 11/21/2011 Auto Detect   . BRDY-0008RV 11/21/2011 A pace on PVC   . BRDY-0009RV 11/21/2011 No   . BRDY-0010RV 11/21/2011 V.Safety=On   . BAMS-0001 11/21/2011 150   . BAMS-0003 11/21/2011 70      Results for this Opt Visit:     Results for orders placed in visit on 11/21/11  PACEMAKER DEVICE OBSERVATION      Component Value Range   DEVICE MODEL PM [id number]     [id number] Duke Salvia  M.D.     [id number] Duke Salvia  M.D.     PACEART TECH NOTES PM       Value: Pacemaker check in clinic. Normal device function. Thresholds, sensing, impedances consistent with previous measurements. Device programmed to maximize longevity. 81% mode switch, - coumadin.  1 high ventricular  rates noted. Device programmed at      appropriate safety margins. Histogram distribution appropriate for patient activity level. Device programmed to optimize intrinsic conduction.  Patient education completed.  ROV 6 months with the device clinic in [place].   ATRIAL PACING PM 85     VENTRICULAR PACING PM 3.1     BATTERY VOLTAGE 2.76     AL IMPEDENCE PM 442     RV LEAD IMPEDENCE PM 547     AL AMPLITUDE 2.7     RV LEAD AMPLITUDE 12     RV LEAD THRESHOLD 0.625     BRDY-0001RV DDDR     BRDY-0002RV 60     BRDY-0003RV 120     BRDY-0004RV 130     BRDY-0005RV Off     BRDY-0007RV Auto Detect     BRDY-0008RV A pace on PVC     BRDY-0009RV No     BRDY-0010RV V.Safety=On     BAMS-0001 150     BAMS-0003 70      EKG Orders placed in visit on 11/21/11  . EKG 12-LEAD     Prior Assessment and Plan Problem List as of 04/15/2012            Cardiology Problems   HYPERLIPIDEMIA   Last Assessment & Plan Note   03/28/2011 Office Visit Signed 03/28/2011  5:42 PM by Kathlen Brunswick, MD    Adequate control of hyperlipidemia when last assessed; repeat lipid profile will be obtained.    Cerebrovascular disease   Last Assessment & Plan Note   03/28/2011 Office Visit Signed 03/28/2011  5:33 PM by Kathlen Brunswick, MD    Patient remains free of neurologic symptoms.    Sick sinus syndrome   Last Assessment & Plan Note   06/13/2011 Office Visit Addendum 06/15/2011 12:56 PM by Kathlen Brunswick, MD    Risk factors for thromboembolism including relatively advanced age and history of hypertension, although we have no documentation of elevated blood pressures.  Treatment for control of heart rate in atrial fibrillation may be providing adequate therapy for mild hypertension.  Risk of thromboembolism is low to moderate.  With additional information from his transesophageal echocardiogram, I feel relatively comfortable about continuing treatment  with aspirin alone.  This will need to be reevaluated as additional  anticoagulant drugs become available, hopefully with more favorable risk/benefit profiles, and as Mr. Tan's age increases.    Hypertension   Last Assessment & Plan Note   11/21/2011 Office Visit Signed 11/21/2011 10:24 AM by Marinus Maw, MD    His blood pressure is elevated but he assures me that it has been well controlled at home.      Other   LOW BACK PAIN, CHRONIC   ORTHOSTATIC DIZZINESS   Last Assessment & Plan Note   06/13/2011 Office Visit Signed 06/13/2011 12:47 PM by Kathlen Brunswick, MD    The symptoms have improved with adjustment of his medical regime.  He still occasionally notes mild and brief orthostatic lightheadedness.    CHEST PAIN   Last Assessment & Plan Note   03/28/2011 Office Visit Addendum 04/07/2011  5:47 PM by Kathlen Brunswick, MD    Recent symptoms prompting observation stay at [facility] are most suggestive of a GI etiology and have improved substantially with an increase in his dose of omeprazole.  Supplemental antacids suggested as needed.  With a negative stress imaging study approximately one year ago, no further testing is warranted.    BENIGN PROSTATIC HYPERTROPHY, MILD, HX OF   Cardiac pacemaker in situ   Last Assessment & Plan Note   11/21/2011 Office Visit Signed 11/21/2011 10:23 AM by Marinus Maw, MD    His device is working normally. He will followup in several months.        Imaging: No results found.   FRS Calculation: Score not calculated. Missing: Total Cholesterol, HDL

## 2012-04-15 NOTE — Assessment & Plan Note (Signed)
Symptoms continue in the absence of documented orthostatic hypotension.  Patient provided with behavioral mechanisms to deal with this problem.

## 2012-04-15 NOTE — Assessment & Plan Note (Signed)
Patient continues to have marginal indication for chronic anticoagulation and a strong desire not to proceed with that therapy.  I recommended initiation of full anticoagulation should frankly hypertensive blood pressures be recorded or at age 71, whichever comes first.

## 2012-04-15 NOTE — Progress Notes (Signed)
Patient ID: [name], male   DOB: [birth year], 71 y.o.   MRN: [id number]  HPI: Scheduled return visit for this very nice gentleman with sick sinus syndrome for which a pacemaker was placed some years ago.  Based upon interrogation of the device, he continues to have considerable paroxysmal atrial fibrillation, but is essentially asymptomatic.  He notes occasional minor palpitations in the morning just before arising from bed.  He describes episodes of orthostatic lightheadedness and days when he is excessively fatigued, but is unaware if these are related to arrhythmia.  Prior to Admission medications   Medication Sig Start Date End Date Taking? Authorizing Provider  aspirin (ECOTRIN) 325 MG EC tablet Take 325 mg by mouth daily.     Yes Historical Provider, MD  diltiazem (TIAZAC) 420 MG 24 hr capsule Take 420 mg by mouth daily.     Yes Historical Provider, MD  gabapentin (NEURONTIN) 300 MG capsule Take 600 mg by mouth 2 (two) times daily. 2 po bid   Yes Historical Provider, MD  HYDROcodone-acetaminophen (VICODIN) 5-500 MG per tablet Take 1 tablet by mouth. As needed for pain    Yes Historical Provider, MD  loratadine (CLARITIN) 10 MG tablet Take 10 mg by mouth at bedtime.   Yes Historical Provider, MD  metoprolol tartrate (LOPRESSOR) 25 MG tablet TAKE ONE-HALF TABLET BY MOUTH TWICE DAILY 11/14/11  Yes Jodelle Gross, NP  naproxen (NAPROSYN) 500 MG tablet Take 500 mg by mouth as needed.   Yes Historical Provider, MD  Omeprazole 20 MG TBEC Take 20 mg by mouth 2 (two) times daily.    Yes Historical Provider, MD  Tamsulosin HCl (FLOMAX) 0.4 MG CAPS Take by mouth daily.     Yes Historical Provider, MD   Allergies  Allergen Reactions  . Penicillins     REACTION: rash  . Shellfish Allergy Hives    Accelerated allergic reaction; questionable laryngospasm or laryngeal edema with some difficulty breathing prompting ED evaluation and treatment at [facility]     Past medical history, social  history, and family history reviewed and updated.  ROS: Denies orthopnea, dyspnea, PND, syncope.  No cough or sputum production.  No evidence for GI bleeding.  All other systems reviewed and are negative.  PHYSICAL EXAM: BP 117/74  Pulse 70  Ht 5\' 8"  (1.727 m)  Wt 106.196 kg (234 lb 1.9 oz)  BMI 35.60 kg/m2 ; no orthostatic change in blood pressure General-Well developed; no acute distress Body habitus-Moderately overweight Neck-No JVD; no carotid bruits Lungs-clear lung fields; resonant to percussion Cardiovascular-normal PMI; normal S1 and S2; regular rhythm Abdomen-normal bowel sounds; soft and non-tender without masses or organomegaly Musculoskeletal-No deformities, no cyanosis or clubbing Neurologic-Normal cranial nerves; symmetric strength and tone Skin-Warm, no significant lesions Extremities-distal pulses intact; no edema  ASSESSMENT AND PLAN:   Bing, MD 04/15/2012 3:41 PM

## 2012-04-19 ENCOUNTER — Other Ambulatory Visit: Payer: Self-pay | Admitting: Cardiology

## 2012-04-19 LAB — LIPID PANEL
HDL: 40 mg/dL (ref >39)
LDL Cholesterol: 104 mg/dL — ABNORMAL HIGH (ref 0–99)
Triglycerides: 157 mg/dL — ABNORMAL HIGH (ref <150)

## 2012-04-20 ENCOUNTER — Encounter: Payer: Self-pay | Admitting: Cardiology

## 2012-04-22 ENCOUNTER — Encounter: Payer: Self-pay | Admitting: *Deleted

## 2012-05-11 ENCOUNTER — Other Ambulatory Visit: Payer: Self-pay | Admitting: Adult Health

## 2012-05-31 ENCOUNTER — Encounter: Payer: Self-pay | Admitting: *Deleted

## 2012-06-12 ENCOUNTER — Ambulatory Visit (INDEPENDENT_AMBULATORY_CARE_PROVIDER_SITE_OTHER): Payer: Medicare Other | Admitting: *Deleted

## 2012-06-12 DIAGNOSIS — I495 Sick sinus syndrome: Secondary | ICD-10-CM

## 2012-06-12 DIAGNOSIS — Z950 Presence of cardiac pacemaker: Secondary | ICD-10-CM

## 2012-06-12 LAB — PACEMAKER DEVICE OBSERVATION
AL IMPEDENCE PM: 423 Ohm
ATRIAL PACING PM: 75
BAMS-0001: 150 {beats}/min
BAMS-0003: 70 {beats}/min
BATTERY VOLTAGE: 2.76 V
BRDY-0002RV: 60 {beats}/min
BRDY-0003RV: 120 {beats}/min
BRDY-0004RV: 130 {beats}/min
RV LEAD AMPLITUDE: 12 mv
RV LEAD THRESHOLD: 0.75 V

## 2012-06-12 NOTE — Progress Notes (Signed)
Pacer check in clinic  

## 2012-07-02 ENCOUNTER — Encounter: Payer: Self-pay | Admitting: Internal Medicine

## 2012-11-28 ENCOUNTER — Ambulatory Visit (INDEPENDENT_AMBULATORY_CARE_PROVIDER_SITE_OTHER): Payer: Medicare Other | Admitting: Internal Medicine

## 2012-11-28 ENCOUNTER — Encounter: Payer: Self-pay | Admitting: Internal Medicine

## 2012-11-28 VITALS — BP 120/75 | HR 70 | Ht 69.0 in | Wt 236.0 lb

## 2012-11-28 DIAGNOSIS — Z950 Presence of cardiac pacemaker: Secondary | ICD-10-CM

## 2012-11-28 DIAGNOSIS — I10 Essential (primary) hypertension: Secondary | ICD-10-CM

## 2012-11-28 DIAGNOSIS — I495 Sick sinus syndrome: Secondary | ICD-10-CM

## 2012-11-28 DIAGNOSIS — I4891 Unspecified atrial fibrillation: Secondary | ICD-10-CM

## 2012-11-28 LAB — PACEMAKER DEVICE OBSERVATION
AL AMPLITUDE: 1.5 mv
BRDY-0004RV: 130 {beats}/min
DEVICE MODEL PM: 1269181
RV LEAD AMPLITUDE: 12 mv

## 2012-11-28 NOTE — Assessment & Plan Note (Signed)
His [employer] Jude dual-chamber pacemaker is working normally. We'll plan to recheck in several months. 

## 2012-11-28 NOTE — Progress Notes (Signed)
HPI [name] returns today for followup. He is a very pleasant 72 year old man with a history of symptomatic bradycardia, paroxysmal atrial fibrillation, borderline hypertension, status post permanent pacemaker insertion. In the interim, he notes shortness of breath with exertion as well as lower back pain. He is a history of severe arthritis in his spine including cervical spine disease and lumbar spine disease. The patient wears CPAP at home. He notes that times he feels like he is unable to be breath. He does admit to dietary indiscretion. The patient has not been exercising, and has gained weight in the last several months. No peripheral edema. Allergies  Allergen Reactions  . Penicillins     REACTION: rash  . Shellfish Allergy Hives    Accelerated allergic reaction; questionable laryngospasm or laryngeal edema with some difficulty breathing prompting ED evaluation and treatment at [facility]     Current Outpatient Prescriptions  Medication Sig Dispense Refill  . aspirin (ECOTRIN) 325 MG EC tablet Take 325 mg by mouth daily.        Marland Kitchen diltiazem (TIAZAC) 420 MG 24 hr capsule Take 420 mg by mouth daily.        . finasteride (PROSCAR) 5 MG tablet Take 5 mg by mouth daily.      Marland Kitchen gabapentin (NEURONTIN) 300 MG capsule Take 600 mg by mouth 2 (two) times daily. 2 po bid      . HYDROcodone-acetaminophen (VICODIN) 5-500 MG per tablet Take 1 tablet by mouth. As needed for pain       . metoprolol tartrate (LOPRESSOR) 25 MG tablet TAKE ONE-HALF TABLET BY MOUTH TWICE DAILY  30 tablet  6  . naproxen (NAPROSYN) 500 MG tablet Take 500 mg by mouth as needed.      . Omeprazole 20 MG TBEC Take 20 mg by mouth 2 (two) times daily.       . Tamsulosin HCl (FLOMAX) 0.4 MG CAPS Take by mouth daily.         No current facility-administered medications for this visit.     Past Medical History  Diagnosis Date  . General weakness     +malaise, exercise intolerance, and near syncope  . Atrial flutter     w  sick sinus syndrome; dual-chamber pacing initiated in 05/2007; normal echocardiogram in 2009  . Hypertension   . BPH (benign prostatic hyperplasia)   . Cerebrovascular disease     carotid bruit; no focal stenosis by carotid ultrasound in 05/2008  . Low back pain     history of traumax2    ROS:   All systems reviewed and negative except as noted in the HPI.   Past Surgical History  Procedure Laterality Date  . Cervical discectomy  2006  . Cataract extraction      Bilateral     History reviewed. No pertinent family history.   History   Social History  . Marital Status: Divorced    Spouse Name: N/A    Number of Children: N/A  . Years of Education: N/A   Occupational History  . Not on file.   Social History Main Topics  . Smoking status: Former Smoker -- 1.00 packs/day for .5 years    Types: Cigarettes    Quit date: 03/27/1962  . Smokeless tobacco: Never Used  . Alcohol Use: No  . Drug Use: No  . Sexually Active: Not on file   Other Topics Concern  . Not on file   Social History Narrative   Divorced with 3 adult children.  Semi retired.      BP 120/75  Pulse 70  Ht 5\' 9"  (1.753 m)  Wt 236 lb (107.049 kg)  BMI 34.84 kg/m2  SpO2 97%  Physical Exam:  Well appearing 72 year old man,NAD HEENT: Unremarkable Neck:  7 cm JVD, no thyromegally Lungs:  Clear, with no wheezes, rales, or rhonchi. HEART:  Regular rate rhythm, no murmurs, no rubs, no clicks Abd:  soft, obese,positive bowel sounds, no organomegally, no rebound, no guarding Ext:  2 plus pulses, no edema, no cyanosis, no clubbing Skin:  No rashes no nodules Neuro:  CN II through XII intact, motor grossly intact  DEVICE  Normal device function.  See PaceArt for details.   Assess/Plan:

## 2012-11-28 NOTE — Assessment & Plan Note (Signed)
The patient has atrial fibrillation approximately 35% of the time. He does not experience palpitations. I suspect his shortness of breath may be related to his atrial fibrillation. His stroke risk is low. I'm hesitant to recommend antiarrhythmic drug therapy at this point in time though I would consider doing so if his symptoms were to worsen, particularly if he began to experience palpitations.

## 2012-11-28 NOTE — Assessment & Plan Note (Signed)
His blood pressure is well controlled today. No change in his blood pressure medications. I've encouraged the patient to increase his physical activity, and maintain a low-sodium diet.

## 2012-11-28 NOTE — Patient Instructions (Addendum)
Follow Up with [name] in 6 months    Your physician wants you to follow-up in: 1 year You will receive a reminder letter in the mail two months in advance. If you don't receive a letter, please call our office to schedule the follow-up appointment.

## 2012-12-02 ENCOUNTER — Encounter: Payer: Self-pay | Admitting: Internal Medicine

## 2012-12-27 ENCOUNTER — Telehealth: Payer: Self-pay | Admitting: Adult Health

## 2012-12-27 MED ORDER — METOPROLOL TARTRATE 25 MG PO TABS: 12.5000 mg | ORAL_TABLET | Freq: Two times a day (BID) | ORAL | Status: DC

## 2012-12-27 NOTE — Telephone Encounter (Signed)
Pt clarified pharmacy, requested 90day supply, noted pt taking half of 25mg  tablet BID since last OV, sent #[phone] with 1 refill via escribe, pt aware

## 2012-12-27 NOTE — Telephone Encounter (Signed)
PT NEEDS METOPROLOL CALLED IN. STATES THE PHARMACY HAS BEEN TRYING SINCE END OF LAST WEEK TO GET FILLED.  IF WE COULD CALL IT IN FOR TODAY PLEASE.

## 2013-04-07 ENCOUNTER — Ambulatory Visit: Payer: Medicare Other | Admitting: Cardiology

## 2013-04-16 ENCOUNTER — Ambulatory Visit: Payer: Medicare Other | Admitting: Cardiology

## 2013-04-24 ENCOUNTER — Ambulatory Visit: Payer: Medicare Other | Admitting: Adult Health

## 2013-05-07 ENCOUNTER — Encounter: Payer: Self-pay | Admitting: Adult Health

## 2013-05-07 ENCOUNTER — Ambulatory Visit (INDEPENDENT_AMBULATORY_CARE_PROVIDER_SITE_OTHER): Payer: Medicare Other | Admitting: Adult Health

## 2013-05-07 VITALS — BP 138/80 | HR 61 | Ht 68.0 in | Wt 233.0 lb

## 2013-05-07 DIAGNOSIS — I10 Essential (primary) hypertension: Secondary | ICD-10-CM

## 2013-05-07 DIAGNOSIS — I679 Cerebrovascular disease, unspecified: Secondary | ICD-10-CM

## 2013-05-07 DIAGNOSIS — R51 Headache: Secondary | ICD-10-CM

## 2013-05-07 DIAGNOSIS — I4891 Unspecified atrial fibrillation: Secondary | ICD-10-CM

## 2013-05-07 DIAGNOSIS — I495 Sick sinus syndrome: Secondary | ICD-10-CM

## 2013-05-07 NOTE — Assessment & Plan Note (Signed)
Blood pressure is essentially controlled. He takes it at home as well with averages in the 130's systolic. He will continue current medications as directed.

## 2013-05-07 NOTE — Assessment & Plan Note (Signed)
Rate is controlled currently. He is not on coumadin currently. States that Dr Dietrich Pates wanted to wait until he was 60. Will defer to Dr. Ladona Ridgel for recommendations unless carotid studies show worsening stenosis.

## 2013-05-07 NOTE — Assessment & Plan Note (Signed)
With known disease with some vague complaints of numbness and pain unilaterally, not on coumadin with atrial fib, I will check carotid ultrasound for evaluation of progressive disease.

## 2013-05-07 NOTE — Progress Notes (Deleted)
Name: NYAIR DEPAULO    DOB: [birth year]  Age: 72 y.o.  MR#: [id number]       PCP:  Carylon Perches, MD      Insurance: Payor: [employer] MEDICARE / Plan: AARP MEDICARE COMPLETE / Product Type: *No Product type* /   CC:    Chief Complaint  Patient presents with  . Atrial Fibrillation  . Hypertension    VS Filed Vitals:   05/07/13 1253  BP: 138/80  Pulse: 61  Height: 5\' 8"  (1.727 m)  Weight: 233 lb (105.688 kg)    Weights Current Weight  05/07/13 233 lb (105.688 kg)  11/28/12 236 lb (107.049 kg)  04/15/12 234 lb 1.9 oz (106.196 kg)    Blood Pressure  BP Readings from Last 3 Encounters:  05/07/13 138/80  11/28/12 120/75  04/15/12 117/74     Admit date:  (Not on file) Last encounter with [facility]:  12/27/2012   Allergy Penicillins and Shellfish allergy  Current Outpatient Prescriptions  Medication Sig Dispense Refill  . aspirin (ECOTRIN) 325 MG EC tablet Take 325 mg by mouth daily.        Marland Kitchen diltiazem (TIAZAC) 420 MG 24 hr capsule Take 420 mg by mouth daily.       . finasteride (PROSCAR) 5 MG tablet Take 5 mg by mouth daily.      Marland Kitchen gabapentin (NEURONTIN) 300 MG capsule Take 600 mg by mouth 2 (two) times daily. 2 po bid      . HYDROcodone-acetaminophen (VICODIN) 5-500 MG per tablet Take 1 tablet by mouth. As needed for pain       . ketoconazole (NIZORAL) 2 % cream       . metoprolol tartrate (LOPRESSOR) 25 MG tablet Take 0.5 tablets (12.5 mg total) by mouth 2 (two) times daily.  90 tablet  1  . naproxen (NAPROSYN) 500 MG tablet Take 500 mg by mouth as needed.      . pantoprazole (PROTONIX) 40 MG tablet Take 40 mg by mouth daily.      . Tamsulosin HCl (FLOMAX) 0.4 MG CAPS Take by mouth daily.         No current facility-administered medications for this visit.    Discontinued Meds:    Medications Discontinued During This Encounter  Medication Reason  . finasteride (PROSCAR) 5 MG tablet Error  . prochlorperazine (COMPAZINE) 10 MG tablet Error  . Omeprazole 20 MG TBEC  Error    Patient Active Problem List   Diagnosis Date Noted  . Atrial fibrillation 11/28/2012  . Hypertension 11/21/2011  . Sick sinus syndrome 06/13/2011  . HYPERLIPIDEMIA 02/17/2010  . ORTHOSTATIC DIZZINESS 02/17/2010  . CHEST PAIN 02/17/2010  . PPM-St.Jude 06/16/2009  . LOW BACK PAIN, CHRONIC 07/31/2008  . Cerebrovascular disease 07/31/2008  . BENIGN PROSTATIC HYPERTROPHY, MILD, HX OF 07/31/2008    LABS    Component Value Date/Time   NA 136 07/24/2008 0917   NA 137 07/10/2008 1100   NA 138 03/26/2008   K 3.9 07/24/2008 0917   K 3.6 07/10/2008 1100   K 3.9 03/26/2008   CL 101 07/24/2008 0917   CL 100 07/10/2008 1100   CL 101 03/26/2008   CO2 28 07/24/2008 0917   CO2 25 07/10/2008 1100   CO2 24 03/26/2008   GLUCOSE 107* 07/24/2008 0917   GLUCOSE 105* 07/10/2008 1100   GLUCOSE 92 03/26/2008   BUN 14 07/24/2008 0917   BUN 11 07/10/2008 1100   BUN 17 03/26/2008   CREATININE 0.98 07/24/2008 0917  CREATININE 0.96 07/10/2008 1100   CREATININE 1.11 03/26/2008   CALCIUM 9.2 07/24/2008 0917   CALCIUM 9.4 07/10/2008 1100   CALCIUM 9.1 03/26/2008   GFRNONAA >60 07/24/2008 0917   GFRNONAA >60 07/10/2008 1100   GFRAA  Value: >60        The eGFR has been calculated using the MDRD equation. This calculation has not been validated in all clinical 07/24/2008 0917   GFRAA  Value: >60        The eGFR has been calculated using the MDRD equation. This calculation has not been validated in all clinical 07/10/2008 1100   CMP     Component Value Date/Time   NA 136 07/24/2008 0917   K 3.9 07/24/2008 0917   CL 101 07/24/2008 0917   CO2 28 07/24/2008 0917   GLUCOSE 107* 07/24/2008 0917   BUN 14 07/24/2008 0917   CREATININE 0.98 07/24/2008 0917   CALCIUM 9.2 07/24/2008 0917   PROT 7.3 07/10/2008 1100   ALBUMIN 4.0 07/10/2008 1100   AST 23 07/10/2008 1100   ALT 22 07/10/2008 1100   ALKPHOS 74 07/10/2008 1100   BILITOT 0.6 07/10/2008 1100   GFRNONAA >60 07/24/2008 0917   GFRAA  Value: >60        The eGFR has  been calculated using the MDRD equation. This calculation has not been validated in all clinical 07/24/2008 0917       Component Value Date/Time   WBC 5.9 07/10/2008 1100   WBC 6.0 03/26/2008   HGB 14.4 07/10/2008 1100   HGB 14.2 03/26/2008   HCT 42.3 07/10/2008 1100   HCT 43.4 03/26/2008   MCV 91.6 07/10/2008 1100   MCV 95.6 03/26/2008    Lipid Panel     Component Value Date/Time   CHOL 175 04/19/2012 1112   TRIG 157* 04/19/2012 1112   HDL 40 04/19/2012 1112   CHOLHDL 4.4 04/19/2012 1112   VLDL 31 04/19/2012 1112   LDLCALC 104* 04/19/2012 1112    ABG No results found for this basename: phart, pco2, pco2art, po2, po2art, hco3, tco2, acidbasedef, o2sat     Lab Results  Component Value Date   TSH 3.492 03/26/2008   BNP (last 3 results) No results found for this basename: PROBNP,  in the last 8760 hours Cardiac Panel (last 3 results) No results found for this basename: CKTOTAL, CKMB, TROPONINI, RELINDX,  in the last 72 hours  Iron/TIBC/Ferritin No results found for this basename: iron, tibc, ferritin     EKG Orders placed in visit on 05/07/13  . EKG 12-LEAD     Prior Assessment and Plan Problem List as of 05/07/2013   HYPERLIPIDEMIA   Last Assessment & Plan   04/15/2012 Office Visit Written 04/15/2012  3:46 PM by Kathlen Brunswick, MD     Most recent lipid profile available is from 2011; a repeat study will be obtained.    LOW BACK PAIN, CHRONIC   ORTHOSTATIC DIZZINESS   Last Assessment & Plan   04/15/2012 Office Visit Written 04/15/2012  3:47 PM by Kathlen Brunswick, MD     Symptoms continue in the absence of documented orthostatic hypotension.  Patient provided with behavioral mechanisms to deal with this problem.    Cerebrovascular disease   Last Assessment & Plan   03/28/2011 Office Visit Written 03/28/2011  5:33 PM by Kathlen Brunswick, MD     Patient remains free of neurologic symptoms.    CHEST PAIN   Last Assessment & Plan   03/28/2011  Office Visit Edited 04/07/2011  5:47 PM by  Kathlen Brunswick, MD     Recent symptoms prompting observation stay at [facility] are most suggestive of a GI etiology and have improved substantially with an increase in his dose of omeprazole.  Supplemental antacids suggested as needed.  With a negative stress imaging study approximately one year ago, no further testing is warranted.    BENIGN PROSTATIC HYPERTROPHY, MILD, HX OF   PPM-St.Jude   Last Assessment & Plan   11/28/2012 Office Visit Written 11/28/2012 12:18 PM by Marinus Maw, MD     His St. Jude dual-chamber pacemaker is working normally. We'll plan to recheck in several months.    Sick sinus syndrome   Last Assessment & Plan   04/15/2012 Office Visit Written 04/15/2012  3:48 PM by Kathlen Brunswick, MD     Patient continues to have marginal indication for chronic anticoagulation and a strong desire not to proceed with that therapy.  I recommended initiation of full anticoagulation should frankly hypertensive blood pressures be recorded or at age 45, whichever comes first.    Hypertension   Last Assessment & Plan   11/28/2012 Office Visit Written 11/28/2012 12:20 PM by Marinus Maw, MD     His blood pressure is well controlled today. No change in his blood pressure medications. I've encouraged the patient to increase his physical activity, and maintain a low-sodium diet.    Atrial fibrillation   Last Assessment & Plan   11/28/2012 Office Visit Written 11/28/2012 12:20 PM by Marinus Maw, MD     The patient has atrial fibrillation approximately 35% of the time. He does not experience palpitations. I suspect his shortness of breath may be related to his atrial fibrillation. His stroke risk is low. I'm hesitant to recommend antiarrhythmic drug therapy at this point in time though I would consider doing so if his symptoms were to worsen, particularly if he began to experience palpitations.        Imaging: No results found.

## 2013-05-07 NOTE — Progress Notes (Signed)
HPI: [name] is a 72 year old patient of Dr. Ladona Ridgel we are following for ongoing assessment and management of symptomatic bradycardia, status post pacemaker implantation, paroxysmal atrial fibrillation, borderline hypertension., With history of obstructive sleep apnea wearing CPAP at home. Last seen by Dr. Ladona Ridgel in March of 2014 with pacemaker interrogation. He was found that he was in atrial fibrillation approximately 35% of the time. Her medications were changed.   He comes today with right facial heaviness and soreness to the touch. No blurred vision, no slurred speech or unilateral weakness. He denies heart racing or DOE. He is medically compliant.   Allergies  Allergen Reactions  . Penicillins     REACTION: rash  . Shellfish Allergy Hives    Accelerated allergic reaction; questionable laryngospasm or laryngeal edema with some difficulty breathing prompting ED evaluation and treatment at [facility]    Current Outpatient Prescriptions  Medication Sig Dispense Refill  . aspirin (ECOTRIN) 325 MG EC tablet Take 325 mg by mouth daily.        Marland Kitchen diltiazem (TIAZAC) 420 MG 24 hr capsule Take 420 mg by mouth daily.       . finasteride (PROSCAR) 5 MG tablet Take 5 mg by mouth daily.      Marland Kitchen gabapentin (NEURONTIN) 300 MG capsule Take 600 mg by mouth 2 (two) times daily. 2 po bid      . HYDROcodone-acetaminophen (VICODIN) 5-500 MG per tablet Take 1 tablet by mouth. As needed for pain       . ketoconazole (NIZORAL) 2 % cream       . metoprolol tartrate (LOPRESSOR) 25 MG tablet Take 0.5 tablets (12.5 mg total) by mouth 2 (two) times daily.  90 tablet  1  . naproxen (NAPROSYN) 500 MG tablet Take 500 mg by mouth as needed.      . pantoprazole (PROTONIX) 40 MG tablet Take 40 mg by mouth daily.      . Tamsulosin HCl (FLOMAX) 0.4 MG CAPS Take by mouth daily.         No current facility-administered medications for this visit.    Past Medical History  Diagnosis Date  . General weakness    +malaise, exercise intolerance, and near syncope  . Atrial flutter     w sick sinus syndrome; dual-chamber pacing initiated in 05/2007; normal echocardiogram in 2009  . Hypertension   . BPH (benign prostatic hyperplasia)   . Cerebrovascular disease     carotid bruit; no focal stenosis by carotid ultrasound in 05/2008  . Low back pain     history of traumax2    Past Surgical History  Procedure Laterality Date  . Cervical discectomy  2006  . Cataract extraction      Bilateral    ZOX:[id number] of systems complete and found to be negative unless listed above  PHYSICAL EXAM BP 138/80  Pulse 61  Ht 5\' 8"  (1.727 m)  Wt 233 lb (105.688 kg)  BMI 35.44 kg/m2  General: Well developed, well nourished, in no acute distress, obese. Head: Eyes PERRLA, No xanthomas.   Normal cephalic and atramatic  Lungs: Clear bilaterally to auscultation and percussion. Heart: HRIR S1 S2, without MRG.  Pulses are 2+ & equal.            No carotid bruit. No JVD.  Abdomen: Bowel sounds are positive, abdomen soft and non-tender without masses or                  Hernia's noted. Msk:  Back  normal, normal gait. Normal strength and tone for age. Extremities: No clubbing, cyanosis or edema.  DP +1 Neuro: Alert and oriented X 3. Psych:  Good affect, responds appropriately  EKG: Paced rhythm rate of 64 bpm.  ASSESSMENT AND PLAN

## 2013-05-07 NOTE — Patient Instructions (Addendum)
Your physician recommends that you schedule a follow-up appointment in:  6 MONTHS with Dr Ladona Ridgel [name] will receive a reminder letter two months in advance reminding you to call and schedule your appointment. If you don't receive this letter, please contact our office.  Your physician has requested that you have a carotid duplex. This test is an ultrasound of the carotid arteries in your neck. It looks at blood flow through these arteries that supply the brain with blood. Allow one hour for this exam. There are no restrictions or special instructions.  Your physician recommends that you continue on your current medications as directed. Please refer to the Current Medication list given to you today.

## 2013-05-07 NOTE — Assessment & Plan Note (Signed)
Pacemaker will be followed by Dr. Ladona Ridgel

## 2013-05-09 ENCOUNTER — Ambulatory Visit (HOSPITAL_COMMUNITY)
Admission: RE | Admit: 2013-05-09 | Discharge: 2013-05-09 | Disposition: A | Discharge status: Home / Self Care | Payer: Medicare Other | Source: Ambulatory Visit | Attending: Adult Health | Admitting: Adult Health

## 2013-05-09 DIAGNOSIS — I4891 Unspecified atrial fibrillation: Secondary | ICD-10-CM

## 2013-05-09 DIAGNOSIS — I6529 Occlusion and stenosis of unspecified carotid artery: Secondary | ICD-10-CM | POA: Insufficient documentation

## 2013-05-09 DIAGNOSIS — I658 Occlusion and stenosis of other precerebral arteries: Secondary | ICD-10-CM | POA: Insufficient documentation

## 2013-05-09 DIAGNOSIS — R51 Headache: Secondary | ICD-10-CM | POA: Insufficient documentation

## 2013-05-28 ENCOUNTER — Ambulatory Visit (INDEPENDENT_AMBULATORY_CARE_PROVIDER_SITE_OTHER): Payer: Medicare Other | Admitting: *Deleted

## 2013-05-28 DIAGNOSIS — I4891 Unspecified atrial fibrillation: Secondary | ICD-10-CM

## 2013-05-28 LAB — PACEMAKER DEVICE OBSERVATION
AL IMPEDENCE PM: 405 Ohm
ATRIAL PACING PM: 92
BAMS-0001: 150 {beats}/min
BAMS-0003: 70 {beats}/min
BATTERY VOLTAGE: 2.79 V
VENTRICULAR PACING PM: 1

## 2013-05-28 NOTE — Progress Notes (Signed)
PPM check in office. 

## 2013-06-17 ENCOUNTER — Encounter: Payer: Self-pay | Admitting: Internal Medicine

## 2013-07-07 ENCOUNTER — Telehealth: Payer: Self-pay

## 2013-07-07 MED ORDER — METOPROLOL TARTRATE 25 MG PO TABS: 12.5000 mg | ORAL_TABLET | Freq: Two times a day (BID) | ORAL | Status: DC

## 2013-07-07 NOTE — Telephone Encounter (Signed)
Received fax refill request  Rx # [id number] Medication:  Metoprolol 25 mg tab Qty 90 Sig:  One 1/2 tab twice daily Physician:  lawrence

## 2013-09-02 ENCOUNTER — Encounter: Payer: Self-pay | Admitting: Pulmonary Disease

## 2013-09-02 ENCOUNTER — Ambulatory Visit (INDEPENDENT_AMBULATORY_CARE_PROVIDER_SITE_OTHER): Payer: Medicare Other | Admitting: Pulmonary Disease

## 2013-09-02 VITALS — BP 128/70 | HR 65 | Temp 97.7°F | Ht 68.0 in | Wt 236.0 lb

## 2013-09-02 DIAGNOSIS — G4733 Obstructive sleep apnea (adult) (pediatric): Secondary | ICD-10-CM | POA: Insufficient documentation

## 2013-09-02 NOTE — Patient Instructions (Signed)
Will see if your home care company has a loaner auto device to re-optimize your pressure.  Will call you with results once we obtain your download. Work on weight loss Try turning up the heat some on the heated humidifier to see if helps with nasal congestion when you lie down.  followup with me in one year if doing well, but let's stay in touch until we get your pressure adjusted.

## 2013-09-02 NOTE — Progress Notes (Signed)
Subjective:    Patient ID: [name]., male    DOB: Aug 07, 1941, 72 y.o.   MRN: [id number]  HPI The patient is a 72 year old male who I've been asked to see for management of obstructive sleep apnea. He was diagnosed with severe OSA in 2008, where he had an AHI of 50 events per hour. He was titrated to 8 cm of water pressure during his split-night study. He has been on CPAP since that time, and initially did very well. He now feels that he has air hunger, and difficulty getting a deep breath while wearing the mask initially. He also has issues with nasal congestion, and is unsure if this is contributing.  He has turned his pressure up one "click", and feels that the pressure level is better. Despite wearing CPAP, he continues to have frequent awakenings for nocturia, but he feels rested most mornings. He does have sleep pressure with inactivity, and his Epworth score today is 11. His weight is neutral from 2008.   Sleep Questionnaire What time do you typically go to bed?( Between what hours) 10-11am 10-11am at 1127 on 09/02/13 by Maisie Fus, CMA How long does it take you to fall asleep? 5-45mins 5-65mins at 1127 on 09/02/13 by Maisie Fus, CMA How many times during the night do you wake up? No Value 2-6x at 1127 on 09/02/13 by Maisie Fus, CMA What time do you get out of bed to start your day? No Value 7-730a at 1127 on 09/02/13 by Maisie Fus, CMA Do you drive or operate heavy machinery in your occupation? No No at 1127 on 09/02/13 by Maisie Fus, CMA How much has your weight changed (up or down) over the past two years? (In pounds) 4 lb (1.814 kg) 4 lb (1.814 kg) at 1127 on 09/02/13 by Maisie Fus, CMA Have you ever had a sleep study before? Yes Yes at 1127 on 09/02/13 by Maisie Fus, CMA If yes, location of study? [facility] at 1127 on 09/02/13 by Maisie Fus, CMA If yes, date of study? 2008 2008 at 1127 on 09/02/13 by Maisie Fus, CMA Do  you currently use CPAP? Yes Yes at 1127 on 09/02/13 by Maisie Fus, CMA If so, what pressure? unsure-- was on 8psi unsure-- was on 8psi at 1127 on 09/02/13 by Maisie Fus, CMA Do you wear oxygen at any time? No No at 1127 on 09/02/13 by Maisie Fus, CMA   Review of Systems  Constitutional: Negative for fever and unexpected weight change.  HENT: Positive for congestion. Negative for dental problem, ear pain, nosebleeds, postnasal drip, rhinorrhea, sinus pressure, sneezing, sore throat and trouble swallowing.   Eyes: Negative for redness and itching.  Respiratory: Positive for shortness of breath. Negative for cough, chest tightness and wheezing.   Cardiovascular: Positive for chest pain ( x several years-- reflux?). Negative for palpitations ( irregular heart beats) and leg swelling.  Gastrointestinal: Negative for nausea and vomiting.       Indigestion  Genitourinary: Negative for dysuria.  Musculoskeletal: Positive for joint swelling.  Skin: Negative for rash.  Neurological: Negative for headaches.  Hematological: Does not bruise/bleed easily.  Psychiatric/Behavioral: Negative for dysphoric mood. The patient is not nervous/anxious.        Objective:   Physical Exam Constitutional:  Overweight male, no acute distress  HENT:  Nares patent without discharge, deviated septum to left with significant narrowing.  Oropharynx without exudate, palate mildly elongated.  Eyes:  Perrla, eomi, no scleral icterus  Neck:  No JVD, no TMG  Cardiovascular:  Normal rate, regular rhythm, no rubs or gallops.  1/6 sem        Intact distal pulses  Pulmonary :  Normal breath sounds, no stridor or respiratory distress   No rales, rhonchi, or wheezing  Abdominal:  Soft, nondistended, bowel sounds present.  No tenderness noted.   Musculoskeletal:  No lower extremity edema noted.  Lymph Nodes:  No cervical lymphadenopathy noted  Skin:  No cyanosis noted  Neurologic:  Alert,  appropriate, moves all 4 extremities without obvious deficit.         Assessment & Plan:

## 2013-09-02 NOTE — Assessment & Plan Note (Signed)
The patient has a history of severe obstructive sleep apnea which has been treated with CPAP fairly effectively since 2008. He is now having issues with what sounds like air hunger, and I suspect that his pressure is inadequate at this time.  I would like to optimize his pressure again with an automatic device, and then set his pressure based on the download. I've also encouraged him to work aggressively on weight loss, and to keep up with his mask cushion changes.

## 2013-10-02 ENCOUNTER — Telehealth: Payer: Self-pay | Admitting: Pulmonary Disease

## 2013-10-02 DIAGNOSIS — G4733 Obstructive sleep apnea (adult) (pediatric): Secondary | ICD-10-CM

## 2013-10-02 NOTE — Telephone Encounter (Signed)
Pt calling- would like to switch DME from [facility] to [facility] last seen 09/02/13--order placed for CPAP machine to [facility] Pt states that he has still not received CPAP machine. [facility] will not return his calls. Pt went by and no RT were working to be able to help him, he was told he would have to wait until they returned in 2-3 days. I advised the patient that I could call and get it all straightened out but he refused. 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   Pt states he has been using old machine for the time being.  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  Will send order to [facility] to switch from [facility] to [place].  Order also placed for AUTO set 5-20 x 3 weeks with download to Dr Gwenette Greet @ [phone]. If he does not have built in, needs to get AUTO loaner. Thanks!   Will send to Dr Gwenette Greet as Juluis Rainier that this change was made by request of the patient.

## 2013-10-03 NOTE — Telephone Encounter (Signed)
Ok with me 

## 2013-10-08 ENCOUNTER — Telehealth: Payer: Self-pay | Admitting: Pulmonary Disease

## 2013-10-08 DIAGNOSIS — G4733 Obstructive sleep apnea (adult) (pediatric): Secondary | ICD-10-CM

## 2013-10-08 NOTE — Telephone Encounter (Signed)
LMTCB

## 2013-10-09 NOTE — Telephone Encounter (Signed)
Spoke with [name] and verified the msg  Order was sent to [facility]

## 2013-10-17 NOTE — Telephone Encounter (Signed)
Spoke with patient. Received a letter stating insurance will not cover CPAP d/t not receiving adequate information or records. I see order that was placed and faxed to [facility] stating that [facility] was faxed.  Apparently this has not been received by the DME or they need more information.  PLease advise [facility]. Thanks.

## 2013-11-17 ENCOUNTER — Other Ambulatory Visit: Payer: Self-pay | Admitting: Pulmonary Disease

## 2013-11-17 DIAGNOSIS — G4733 Obstructive sleep apnea (adult) (pediatric): Secondary | ICD-10-CM

## 2013-12-05 ENCOUNTER — Encounter: Payer: Self-pay | Admitting: Internal Medicine

## 2013-12-05 ENCOUNTER — Ambulatory Visit (INDEPENDENT_AMBULATORY_CARE_PROVIDER_SITE_OTHER): Payer: Medicare Other | Admitting: Internal Medicine

## 2013-12-05 VITALS — BP 117/68 | HR 68 | Ht 68.0 in | Wt 235.1 lb

## 2013-12-05 DIAGNOSIS — I495 Sick sinus syndrome: Secondary | ICD-10-CM

## 2013-12-05 DIAGNOSIS — I4891 Unspecified atrial fibrillation: Secondary | ICD-10-CM

## 2013-12-05 DIAGNOSIS — Z950 Presence of cardiac pacemaker: Secondary | ICD-10-CM

## 2013-12-05 DIAGNOSIS — I10 Essential (primary) hypertension: Secondary | ICD-10-CM

## 2013-12-05 LAB — MDC_IDC_ENUM_SESS_TYPE_INCLINIC
Battery Impedance: 1100 Ohm
Brady Statistic RV Percent Paced: 5.1 %
Implantable Pulse Generator Model: 5826
Lead Channel Impedance Value: 387 Ohm
Lead Channel Impedance Value: 511 Ohm
Lead Channel Pacing Threshold Amplitude: 0.75 V
Lead Channel Pacing Threshold Pulse Width: 0.5 ms
Lead Channel Sensing Intrinsic Amplitude: 3.7 mV
Lead Channel Setting Pacing Pulse Width: 0.5 ms
MDC IDC MSMT BATTERY VOLTAGE: 2.76 V
MDC IDC MSMT LEADCHNL RV SENSING INTR AMPL: 12 mV
MDC IDC PG SERIAL: 1269181
MDC IDC SESS DTM: 20150320092952
MDC IDC SET LEADCHNL RA PACING AMPLITUDE: 2 V
MDC IDC SET LEADCHNL RV PACING AMPLITUDE: 2.5 V
MDC IDC SET LEADCHNL RV SENSING SENSITIVITY: 2 mV
MDC IDC STAT BRADY RA PERCENT PACED: 87 %

## 2013-12-05 NOTE — Assessment & Plan Note (Signed)
His St. Jude DDD PM is working normally. Will recheck in several months. 

## 2013-12-05 NOTE — Progress Notes (Signed)
HPI [name] returns today for followup. He is a very pleasant 73 year old man with a history of symptomatic bradycardia, paroxysmal atrial fibrillation, borderline hypertension, status post permanent pacemaker insertion. The patient wears CPAP at home. He notes that times he feels like he is unable to be breath but feels well for the most part. He does admit to dietary indiscretion. The patient has not been exercising, and has gained weight in the last several months. No peripheral edema.  Allergies  Allergen Reactions  . Penicillins     REACTION: rash  . Shellfish Allergy Hives    Accelerated allergic reaction; questionable laryngospasm or laryngeal edema with some difficulty breathing prompting ED evaluation and treatment at [facility]     Current Outpatient Prescriptions  Medication Sig Dispense Refill  . aspirin (ECOTRIN) 325 MG EC tablet Take 325 mg by mouth daily.        . Calcium Carbonate-Vitamin D (CALCIUM-VITAMIN D3) 600-125 MG-UNIT TABS Take 1,200 mg by mouth 2 (two) times daily.       Marland Kitchen diltiazem (TIAZAC) 120 MG 24 hr capsule Take 120 mg by mouth every evening.      . diltiazem (TIAZAC) 300 MG 24 hr capsule Take 300 mg by mouth every morning.      . finasteride (PROSCAR) 5 MG tablet Take 5 mg by mouth daily.      Marland Kitchen gabapentin (NEURONTIN) 600 MG tablet Take 600 mg by mouth 2 (two) times daily.      . hydrocortisone (ANUSOL-HC) 2.5 % rectal cream Place 1 application rectally as needed.       . metoprolol tartrate (LOPRESSOR) 25 MG tablet Take 0.5 tablets (12.5 mg total) by mouth 2 (two) times daily.  90 tablet  1  . morphine (MSIR) 15 MG tablet Take 15 mg by mouth 2 (two) times daily.      . naproxen (NAPROSYN) 500 MG tablet Take 500 mg by mouth as needed.      . Omega-3 Fatty Acids (SUPER OMEGA-3) 1000 MG CAPS Take 2,000 mg by mouth 2 (two) times daily.       . pantoprazole (PROTONIX) 40 MG tablet Take 40 mg by mouth daily.      . polyethylene glycol (MIRALAX / GLYCOLAX) packet  Take 17 g by mouth at bedtime.       . Tamsulosin HCl (FLOMAX) 0.4 MG CAPS Take by mouth daily.         No current facility-administered medications for this visit.     Past Medical History  Diagnosis Date  . General weakness     +malaise, exercise intolerance, and near syncope  . Atrial flutter     w sick sinus syndrome; dual-chamber pacing initiated in 05/2007; normal echocardiogram in 2009  . Hypertension   . BPH (benign prostatic hyperplasia)   . Cerebrovascular disease     carotid bruit; no focal stenosis by carotid ultrasound in 05/2008  . Low back pain     history of traumax2    ROS:   All systems reviewed and negative except as noted in the HPI.   Past Surgical History  Procedure Laterality Date  . Cervical discectomy  2006  . Cataract extraction      Bilateral     History reviewed. No pertinent family history.   History   Social History  . Marital Status: Divorced    Spouse Name: N/A    Number of Children: N/A  . Years of Education: N/A   Occupational History  .  retired    Social History Main Topics  . Smoking status: Former Smoker -- 1.00 packs/day for .5 years    Types: Cigarettes    Quit date: 03/27/1962  . Smokeless tobacco: Never Used  . Alcohol Use: No  . Drug Use: No  . Sexual Activity: Not on file   Other Topics Concern  . Not on file   Social History Narrative   Divorced with 3 adult children. Semi retired.      BP 117/68  Pulse 68  Ht 5\' 8"  (1.727 m)  Wt 235 lb 1.9 oz (106.65 kg)  BMI 35.76 kg/m2  SpO2 99%  Physical Exam:  Well appearing 73 year old man,NAD HEENT: Unremarkable Neck:  6 cm JVD, no thyromegally Lungs:  Clear, with no wheezes, rales, or rhonchi. HEART:  Regular rate rhythm, no murmurs, no rubs, no clicks Abd:  soft, obese,positive bowel sounds, no organomegally, no rebound, no guarding Ext:  2 plus pulses, no edema, no cyanosis, no clubbing Skin:  No rashes no nodules Neuro:  CN II through XII intact,  motor grossly intact  DEVICE  Normal device function.  See PaceArt for details.   Assess/Plan:

## 2013-12-05 NOTE — Patient Instructions (Addendum)
Your physician recommends that you schedule a follow-up appointment in: 12 months with Dr Lovena Le and 6 months with Device clinic.  You will receive a reminder letter two months in advance reminding you to call and schedule your appointment. If you don't receive this letter, please contact our office.  Your physician recommends that you continue on your current medications as directed. Please refer to the Current Medication list given to you today.  Please call our office and let us know which medication you prefer. Eliquis 5 mg BID or Xarelto 20 mg daily.

## 2013-12-05 NOTE — Assessment & Plan Note (Signed)
His blood pressure is well controlled. Will continue current meds.

## 2013-12-05 NOTE — Assessment & Plan Note (Addendum)
He is at risk for stroke and has no bleeding complications. I have recommended starting systemic anti-coagulation and he is willing to proceed. Will check on whether xarelto or eliquis is cheaper for him. If both prohibitively expensive then we could try coumadin. He will stop his ASA.

## 2013-12-11 ENCOUNTER — Telehealth: Payer: Self-pay | Admitting: *Deleted

## 2013-12-11 MED ORDER — RIVAROXABAN 20 MG PO TABS: 20.0000 mg | ORAL_TABLET | Freq: Every day | ORAL | Status: DC

## 2013-12-11 NOTE — Telephone Encounter (Signed)
Pt was checking on xeralto and eliquis to see what RX he would be able to afford.  Same cost for both, so he would like to know what one Dr Lovena Le would like to put him on

## 2013-12-11 NOTE — Telephone Encounter (Signed)
Pt requests xarelto 20 mg hs,called 90 day supply to pharmacy

## 2013-12-12 ENCOUNTER — Telehealth: Payer: Self-pay | Admitting: Internal Medicine

## 2013-12-21 ENCOUNTER — Other Ambulatory Visit: Payer: Self-pay | Admitting: Adult Health

## 2013-12-22 ENCOUNTER — Telehealth: Payer: Self-pay | Admitting: Internal Medicine

## 2013-12-22 NOTE — Telephone Encounter (Signed)
Received fax refill request  Rx # [id number] Medication:  Xarelto 20 mg tab Qty 90 Sig:  Take one tablet by mouth once daily with supper Physician:  Lovena Le  Prior authorization required call [phone]  / Id # [id number]

## 2013-12-23 ENCOUNTER — Telehealth: Payer: Self-pay

## 2013-12-23 NOTE — Telephone Encounter (Signed)
Per Evette at [facility]  pt approved for Xarelto 20 mg qd #90 with RF 3 for 12/23/13 until 12/24/14  Reference number [id number]  Called in to [facility] in [place] with pharmacist AAron,I called pt and informed of above

## 2014-03-17 NOTE — Telephone Encounter (Signed)
This was taken care of in a different telephone note.

## 2014-05-29 ENCOUNTER — Ambulatory Visit (INDEPENDENT_AMBULATORY_CARE_PROVIDER_SITE_OTHER): Payer: Medicare Other | Admitting: *Deleted

## 2014-05-29 DIAGNOSIS — I4891 Unspecified atrial fibrillation: Secondary | ICD-10-CM

## 2014-05-29 DIAGNOSIS — I482 Chronic atrial fibrillation, unspecified: Secondary | ICD-10-CM

## 2014-05-29 LAB — MDC_IDC_ENUM_SESS_TYPE_INCLINIC
Battery Voltage: 2.79 V
Brady Statistic RV Percent Paced: 14 %
Date Time Interrogation Session: 20150911113713
Implantable Pulse Generator Model: 5826
Implantable Pulse Generator Serial Number: 1269181
Lead Channel Impedance Value: 395 Ohm
Lead Channel Pacing Threshold Amplitude: 0.75 V
Lead Channel Sensing Intrinsic Amplitude: 12 mV
Lead Channel Setting Pacing Amplitude: 2 V
Lead Channel Setting Pacing Amplitude: 2.5 V
Lead Channel Setting Sensing Sensitivity: 2 mV
MDC IDC MSMT BATTERY IMPEDANCE: 1400 Ohm
MDC IDC MSMT LEADCHNL RA SENSING INTR AMPL: 2.8 mV
MDC IDC MSMT LEADCHNL RV IMPEDANCE VALUE: 521 Ohm
MDC IDC MSMT LEADCHNL RV PACING THRESHOLD PULSEWIDTH: 0.5 ms
MDC IDC SET LEADCHNL RV PACING PULSEWIDTH: 0.5 ms
MDC IDC STAT BRADY RA PERCENT PACED: 77 %

## 2014-05-29 NOTE — Progress Notes (Signed)
PPM check in office. 

## 2014-06-09 ENCOUNTER — Encounter: Payer: Self-pay | Admitting: Internal Medicine

## 2014-07-21 ENCOUNTER — Other Ambulatory Visit (HOSPITAL_COMMUNITY): Payer: Self-pay | Admitting: Internal Medicine

## 2014-07-21 DIAGNOSIS — R1011 Right upper quadrant pain: Secondary | ICD-10-CM

## 2014-07-23 ENCOUNTER — Ambulatory Visit (HOSPITAL_COMMUNITY)
Admission: RE | Admit: 2014-07-23 | Discharge: 2014-07-23 | Disposition: A | Discharge status: Home / Self Care | Payer: Medicare Other | Source: Ambulatory Visit | Attending: Internal Medicine | Admitting: Internal Medicine

## 2014-07-23 DIAGNOSIS — R1011 Right upper quadrant pain: Secondary | ICD-10-CM | POA: Diagnosis not present

## 2014-08-25 ENCOUNTER — Ambulatory Visit (INDEPENDENT_AMBULATORY_CARE_PROVIDER_SITE_OTHER): Payer: Medicare Other | Admitting: Urology

## 2014-08-25 DIAGNOSIS — R351 Nocturia: Secondary | ICD-10-CM

## 2014-08-25 DIAGNOSIS — N401 Enlarged prostate with lower urinary tract symptoms: Secondary | ICD-10-CM

## 2014-09-02 ENCOUNTER — Encounter: Payer: Self-pay | Admitting: Pulmonary Disease

## 2014-09-02 ENCOUNTER — Encounter (INDEPENDENT_AMBULATORY_CARE_PROVIDER_SITE_OTHER): Payer: Self-pay

## 2014-09-02 ENCOUNTER — Ambulatory Visit (INDEPENDENT_AMBULATORY_CARE_PROVIDER_SITE_OTHER): Payer: Medicare Other | Admitting: Pulmonary Disease

## 2014-09-02 VITALS — BP 108/64 | HR 70 | Temp 97.0°F | Ht 68.0 in | Wt 241.8 lb

## 2014-09-02 DIAGNOSIS — G4733 Obstructive sleep apnea (adult) (pediatric): Secondary | ICD-10-CM

## 2014-09-02 NOTE — Assessment & Plan Note (Signed)
The patient is wearing C Pap compliantly, but I'm not sure that we ever got his pressure appropriately optimized. Have not received a download from his home care company, and the one we did receive did not have leaked data. I would like to re-optimize his pressure using another loaner, and also make sure that his mask is fitting appropriately. I've also encouraged him to work aggressively on weight loss.

## 2014-09-02 NOTE — Progress Notes (Signed)
   Subjective:    Patient ID: [name]., male    DOB: 12/12/1940, 73 y.o.   MRN: [id number]  HPI The patient comes in today for follow-up of his obstructive sleep apnea. He tells me that he is wearing C Pap compliantly, but is having issues with his mask fit because of an aged mask. He has not received a new one from his home care company. He thinks he is doing well overall, but looking back through my notes it does not appear that we ever received a download in order to optimize his pressure. He feels that he is sleeping fairly well with the device overall.   Review of Systems  Constitutional: Negative for fever and unexpected weight change.  HENT: Negative for congestion, dental problem, ear pain, nosebleeds, postnasal drip, rhinorrhea, sinus pressure, sneezing, sore throat and trouble swallowing.   Eyes: Negative for redness and itching.  Respiratory: Negative for cough, chest tightness, shortness of breath and wheezing.   Cardiovascular: Negative for palpitations and leg swelling.  Gastrointestinal: Negative for nausea and vomiting.  Genitourinary: Negative for dysuria.  Musculoskeletal: Negative for joint swelling.  Skin: Negative for rash.  Neurological: Negative for headaches.  Hematological: Does not bruise/bleed easily.  Psychiatric/Behavioral: Negative for dysphoric mood. The patient is not nervous/anxious.        Objective:   Physical Exam Overweight male in no acute distress Nose without purulence or discharge noted Neck without lymphadenopathy or thyromegaly No skin breakdown or pressure necrosis from the C Pap mask Lower extremities without edema, no cyanosis Alert and oriented, does not appear to be sleepy, moves all 4 extremities.       Assessment & Plan:

## 2014-09-02 NOTE — Patient Instructions (Signed)
Will send an order to [place] [employer] to get you a new mask Will need a loaner auto device for a few weeks to look at your pressure and leak data.  Will call once we receive the download. Work on [employer] loss Will see you back in one year, but please call if you do not hear from [place] about your download once your loaner device is picked up.

## 2014-10-08 DIAGNOSIS — N401 Enlarged prostate with lower urinary tract symptoms: Secondary | ICD-10-CM | POA: Diagnosis not present

## 2014-10-08 DIAGNOSIS — I10 Essential (primary) hypertension: Secondary | ICD-10-CM | POA: Diagnosis not present

## 2014-10-08 DIAGNOSIS — G4730 Sleep apnea, unspecified: Secondary | ICD-10-CM | POA: Diagnosis not present

## 2014-10-11 DIAGNOSIS — G4733 Obstructive sleep apnea (adult) (pediatric): Secondary | ICD-10-CM | POA: Diagnosis not present

## 2014-10-27 ENCOUNTER — Ambulatory Visit (INDEPENDENT_AMBULATORY_CARE_PROVIDER_SITE_OTHER): Payer: Medicare Other | Admitting: Urology

## 2014-10-27 DIAGNOSIS — N401 Enlarged prostate with lower urinary tract symptoms: Secondary | ICD-10-CM | POA: Diagnosis not present

## 2014-10-27 DIAGNOSIS — R351 Nocturia: Secondary | ICD-10-CM | POA: Diagnosis not present

## 2014-11-11 DIAGNOSIS — G4733 Obstructive sleep apnea (adult) (pediatric): Secondary | ICD-10-CM | POA: Diagnosis not present

## 2014-12-02 ENCOUNTER — Other Ambulatory Visit: Payer: Self-pay | Admitting: Internal Medicine

## 2014-12-02 MED ORDER — RIVAROXABAN 20 MG PO TABS: 20.0000 mg | ORAL_TABLET | Freq: Every day | ORAL | Status: DC

## 2014-12-02 NOTE — Telephone Encounter (Signed)
Received fax refill request  Rx # [id number] Medication:  Xarelto 20 mg tab Qty 90 Sig:  Take one tablet by mouth once daily with supper Physician:  Lovena Le

## 2014-12-25 ENCOUNTER — Encounter: Payer: Self-pay | Admitting: *Deleted

## 2014-12-25 ENCOUNTER — Encounter: Payer: Self-pay | Admitting: Internal Medicine

## 2014-12-25 ENCOUNTER — Ambulatory Visit (INDEPENDENT_AMBULATORY_CARE_PROVIDER_SITE_OTHER): Payer: Medicare Other | Admitting: Internal Medicine

## 2014-12-25 ENCOUNTER — Telehealth: Payer: Self-pay | Admitting: Internal Medicine

## 2014-12-25 VITALS — BP 116/70 | HR 70 | Ht 68.0 in | Wt 239.4 lb

## 2014-12-25 DIAGNOSIS — R0600 Dyspnea, unspecified: Secondary | ICD-10-CM

## 2014-12-25 DIAGNOSIS — I495 Sick sinus syndrome: Secondary | ICD-10-CM

## 2014-12-25 DIAGNOSIS — Z950 Presence of cardiac pacemaker: Secondary | ICD-10-CM

## 2014-12-25 DIAGNOSIS — I483 Typical atrial flutter: Secondary | ICD-10-CM | POA: Diagnosis not present

## 2014-12-25 DIAGNOSIS — I480 Paroxysmal atrial fibrillation: Secondary | ICD-10-CM | POA: Diagnosis not present

## 2014-12-25 DIAGNOSIS — I10 Essential (primary) hypertension: Secondary | ICD-10-CM

## 2014-12-25 LAB — MDC_IDC_ENUM_SESS_TYPE_INCLINIC
Battery Voltage: 2.75 V
Brady Statistic RA Percent Paced: 72 %
Date Time Interrogation Session: 20160408112400
Implantable Pulse Generator Model: 5826
Implantable Pulse Generator Serial Number: 1269181
Lead Channel Impedance Value: 558 Ohm
Lead Channel Pacing Threshold Amplitude: 0.75 V
Lead Channel Sensing Intrinsic Amplitude: 12 mV
Lead Channel Setting Pacing Amplitude: 2.5 V
Lead Channel Setting Sensing Sensitivity: 2 mV
MDC IDC MSMT BATTERY IMPEDANCE: 1700 Ohm
MDC IDC MSMT LEADCHNL RA IMPEDANCE VALUE: 392 Ohm
MDC IDC MSMT LEADCHNL RA SENSING INTR AMPL: 3.7 mV
MDC IDC MSMT LEADCHNL RV PACING THRESHOLD PULSEWIDTH: 0.5 ms
MDC IDC SET LEADCHNL RA PACING AMPLITUDE: 2 V
MDC IDC SET LEADCHNL RV PACING PULSEWIDTH: 0.5 ms
MDC IDC STAT BRADY RV PERCENT PACED: 15 %

## 2014-12-25 NOTE — Assessment & Plan Note (Signed)
His blood pressure is well controlled. He is encouraged to lose weight. Will follow.

## 2014-12-25 NOTE — Assessment & Plan Note (Signed)
His [employer] DDD PM is working normally. Will follow.

## 2014-12-25 NOTE — Progress Notes (Signed)
HPI [name] returns today for followup. He is a very pleasant 74 year old man with a history of symptomatic bradycardia, paroxysmal atrial fibrillation and persistent atrial flutter, borderline hypertension, status post permanent pacemaker insertion. The patient wears CPAP at home. He notes that times he feels like he is having increasingly difficult time breathing. He does admit to dietary indiscretion. The patient has not been exercising, and has gained weight in the last several months. No peripheral edema. No palpitations. Allergies  Allergen Reactions  . Penicillins     REACTION: rash  . Shellfish Allergy Hives    Accelerated allergic reaction; questionable laryngospasm or laryngeal edema with some difficulty breathing prompting ED evaluation and treatment at [facility]     Current Outpatient Prescriptions  Medication Sig Dispense Refill  . Calcium Carbonate-Vitamin D (CALCIUM-VITAMIN D3) 600-125 MG-UNIT TABS Take 1,200 mg by mouth 2 (two) times daily.     Marland Kitchen diltiazem (TIAZAC) 120 MG 24 hr capsule Take 120 mg by mouth every evening.    . diltiazem (TIAZAC) 300 MG 24 hr capsule Take 300 mg by mouth every morning.    . gabapentin (NEURONTIN) 600 MG tablet Take 800 mg by mouth 2 (two) times daily.     . hydrocortisone (ANUSOL-HC) 2.5 % rectal cream Place 1 application rectally as needed.     . metoprolol tartrate (LOPRESSOR) 25 MG tablet TAKE ONE-HALF TABLET BY MOUTH TWICE DAILY 90 tablet 3  . morphine (MSIR) 15 MG tablet Take 15 mg by mouth 2 (two) times daily.    . pantoprazole (PROTONIX) 40 MG tablet Take 40 mg by mouth daily.    . polyethylene glycol (MIRALAX / GLYCOLAX) packet Take 17 g by mouth at bedtime.     . rivaroxaban (XARELTO) 20 MG TABS tablet Take 1 tablet (20 mg total) by mouth daily with supper. 90 tablet 2  . Tamsulosin HCl (FLOMAX) 0.4 MG CAPS Take by mouth daily.       No current facility-administered medications for this visit.     Past Medical History   Diagnosis Date  . General weakness     +malaise, exercise intolerance, and near syncope  . Atrial flutter     w sick sinus syndrome; dual-chamber pacing initiated in 05/2007; normal echocardiogram in 2009  . Hypertension   . BPH (benign prostatic hyperplasia)   . Cerebrovascular disease     carotid bruit; no focal stenosis by carotid ultrasound in 05/2008  . Low back pain     history of traumax2    ROS:   All systems reviewed and negative except as noted in the HPI.   Past Surgical History  Procedure Laterality Date  . Cervical discectomy  2006  . Cataract extraction      Bilateral     No family history on file.   History   Social History  . Marital Status: Divorced    Spouse Name: N/A  . Number of Children: N/A  . Years of Education: N/A   Occupational History  . retired    Social History Main Topics  . Smoking status: Former Smoker -- 1.00 packs/day for .5 years    Types: Cigarettes    Quit date: 03/27/1962  . Smokeless tobacco: Never Used  . Alcohol Use: No  . Drug Use: No  . Sexual Activity: Not on file   Other Topics Concern  . Not on file   Social History Narrative   Divorced with 3 adult children. Semi retired.      BP  116/70 mmHg  Pulse 70  Ht 5\' 8"  (1.727 m)  Wt 239 lb 6 oz (108.58 kg)  BMI 36.41 kg/m2  Physical Exam:  Well appearing 74 year old man,NAD HEENT: Unremarkable Neck:  6 cm JVD, no thyromegally Lungs:  Clear, with no wheezes, rales, or rhonchi. HEART:  Regular rate rhythm, no murmurs, no rubs, no clicks Abd:  soft, obese,positive bowel sounds, no organomegally, no rebound, no guarding Ext:  2 plus pulses, no edema, no cyanosis, no clubbing Skin:  No rashes no nodules Neuro:  CN II through XII intact, motor grossly intact  ECG - atrial flutter with ventricular pacing  DEVICE  Normal device function.  See PaceArt for details.   Assess/Plan:

## 2014-12-25 NOTE — Patient Instructions (Signed)
Your physician has recommended that you have an ablation. Catheter ablation is a medical procedure used to treat some cardiac arrhythmias (irregular heartbeats). During catheter ablation, a long, thin, flexible tube is put into a blood vessel in your groin (upper thigh), or neck. This tube is called an ablation catheter. It is then guided to your heart through the blood vessel. Radio frequency waves destroy small areas of heart tissue where abnormal heartbeats may cause an arrhythmia to start. Please see the instruction sheet given to you today.  Your physician recommends that you continue on your current medications as directed. Please refer to the Current Medication list given to you today.  Thank you for choosing [facility]!

## 2014-12-25 NOTE — Assessment & Plan Note (Signed)
This is multifactorial. I have recommended flutter ablation. If his dyspnea persists, would consider a 2D echo.

## 2014-12-25 NOTE — Assessment & Plan Note (Signed)
Review of his ECG demonstrates that he has been in atrial flutter the last several visits. He has had symptoms and appears to have typical atrial flutter on ECG. I have recommended proceeding with catheter ablation. He will remain on his Xarelto. He may require anti-arrhythmic drug therapy if he goes on to have atrial fib.

## 2014-12-28 ENCOUNTER — Other Ambulatory Visit: Payer: Self-pay

## 2014-12-28 MED ORDER — METOPROLOL TARTRATE 25 MG PO TABS: 12.5000 mg | ORAL_TABLET | Freq: Two times a day (BID) | ORAL | Status: DC

## 2014-12-28 NOTE — Telephone Encounter (Signed)
Refill complete 

## 2015-01-01 ENCOUNTER — Other Ambulatory Visit: Payer: Self-pay | Admitting: *Deleted

## 2015-01-01 MED ORDER — METOPROLOL TARTRATE 25 MG PO TABS: 12.5000 mg | ORAL_TABLET | Freq: Two times a day (BID) | ORAL | Status: AC

## 2015-01-07 ENCOUNTER — Encounter: Payer: Self-pay | Admitting: Internal Medicine

## 2015-01-08 DIAGNOSIS — I10 Essential (primary) hypertension: Secondary | ICD-10-CM | POA: Diagnosis not present

## 2015-01-08 DIAGNOSIS — I4891 Unspecified atrial fibrillation: Secondary | ICD-10-CM | POA: Diagnosis not present

## 2015-01-11 ENCOUNTER — Other Ambulatory Visit: Payer: Self-pay

## 2015-01-11 DIAGNOSIS — Z01818 Encounter for other preprocedural examination: Secondary | ICD-10-CM | POA: Diagnosis not present

## 2015-01-11 DIAGNOSIS — I483 Typical atrial flutter: Secondary | ICD-10-CM | POA: Diagnosis not present

## 2015-01-11 LAB — CBC WITH DIFFERENTIAL/PLATELET
Basophils Absolute: 0.1 10*3/uL (ref 0.0–0.1)
Basophils Relative: 1 % (ref 0–1)
EOS PCT: 3 % (ref 0–5)
Eosinophils Absolute: 0.2 10*3/uL (ref 0.0–0.7)
HEMATOCRIT: 39 % (ref 39.0–52.0)
Hemoglobin: 13.1 g/dL (ref 13.0–17.0)
Lymphocytes Relative: 28 % (ref 12–46)
Lymphs Abs: 2 10*3/uL (ref 0.7–4.0)
MCH: 31 pg (ref 26.0–34.0)
MCHC: 33.6 g/dL (ref 30.0–36.0)
MCV: 92.2 fL (ref 78.0–100.0)
MPV: 9.2 fL (ref 8.6–12.4)
Monocytes Absolute: 0.6 10*3/uL (ref 0.1–1.0)
Monocytes Relative: 9 % (ref 3–12)
NEUTROS ABS: 4.2 10*3/uL (ref 1.7–7.7)
Neutrophils Relative %: 59 % (ref 43–77)
Platelets: 202 10*3/uL (ref 150–400)
RBC: 4.23 MIL/uL (ref 4.22–5.81)
RDW: 14.5 % (ref 11.5–15.5)
WBC: 7.1 10*3/uL (ref 4.0–10.5)

## 2015-01-11 LAB — PROTIME-INR
INR: 1.55 — ABNORMAL HIGH (ref <1.50)
Prothrombin Time: 18.6 seconds — ABNORMAL HIGH (ref 11.6–15.2)

## 2015-01-12 LAB — BASIC METABOLIC PANEL
BUN: 13 mg/dL (ref 6–23)
CALCIUM: 9 mg/dL (ref 8.4–10.5)
CO2: 26 mEq/L (ref 19–32)
CREATININE: 1.09 mg/dL (ref 0.50–1.35)
Chloride: 103 mEq/L (ref 96–112)
GLUCOSE: 102 mg/dL — AB (ref 70–99)
Potassium: 4 mEq/L (ref 3.5–5.3)
Sodium: 139 mEq/L (ref 135–145)

## 2015-01-18 ENCOUNTER — Ambulatory Visit (HOSPITAL_COMMUNITY)
Admission: RE | Admit: 2015-01-18 | Discharge: 2015-01-18 | Disposition: A | Discharge status: Home / Self Care | Payer: Medicare Other | Source: Ambulatory Visit | Attending: Internal Medicine | Admitting: Internal Medicine

## 2015-01-18 ENCOUNTER — Encounter (HOSPITAL_COMMUNITY): Payer: Self-pay | Admitting: Internal Medicine

## 2015-01-18 ENCOUNTER — Ambulatory Visit (HOSPITAL_COMMUNITY): Admission: RE | Admit: 2015-01-18 | Payer: Medicare Other | Source: Ambulatory Visit | Admitting: Internal Medicine

## 2015-01-18 ENCOUNTER — Encounter (HOSPITAL_COMMUNITY): Admission: RE | Payer: Self-pay | Source: Ambulatory Visit

## 2015-01-18 ENCOUNTER — Encounter (HOSPITAL_COMMUNITY)
Admission: RE | Disposition: A | Discharge status: Home / Self Care | Payer: Medicare Other | Source: Ambulatory Visit | Attending: Internal Medicine

## 2015-01-18 DIAGNOSIS — Z87891 Personal history of nicotine dependence: Secondary | ICD-10-CM | POA: Insufficient documentation

## 2015-01-18 DIAGNOSIS — I480 Paroxysmal atrial fibrillation: Secondary | ICD-10-CM | POA: Diagnosis not present

## 2015-01-18 DIAGNOSIS — Z79899 Other long term (current) drug therapy: Secondary | ICD-10-CM | POA: Diagnosis not present

## 2015-01-18 DIAGNOSIS — E669 Obesity, unspecified: Secondary | ICD-10-CM | POA: Diagnosis not present

## 2015-01-18 DIAGNOSIS — Z6836 Body mass index (BMI) 36.0-36.9, adult: Secondary | ICD-10-CM | POA: Diagnosis not present

## 2015-01-18 DIAGNOSIS — Z880 Allergy status to penicillin: Secondary | ICD-10-CM | POA: Diagnosis not present

## 2015-01-18 DIAGNOSIS — N400 Enlarged prostate without lower urinary tract symptoms: Secondary | ICD-10-CM | POA: Diagnosis not present

## 2015-01-18 DIAGNOSIS — Z7901 Long term (current) use of anticoagulants: Secondary | ICD-10-CM | POA: Diagnosis not present

## 2015-01-18 DIAGNOSIS — I10 Essential (primary) hypertension: Secondary | ICD-10-CM | POA: Diagnosis not present

## 2015-01-18 DIAGNOSIS — I483 Typical atrial flutter: Secondary | ICD-10-CM

## 2015-01-18 DIAGNOSIS — Z950 Presence of cardiac pacemaker: Secondary | ICD-10-CM | POA: Diagnosis not present

## 2015-01-18 DIAGNOSIS — I4892 Unspecified atrial flutter: Secondary | ICD-10-CM | POA: Insufficient documentation

## 2015-01-18 DIAGNOSIS — Z91013 Allergy to seafood: Secondary | ICD-10-CM | POA: Insufficient documentation

## 2015-01-18 HISTORY — PX: ELECTROPHYSIOLOGIC STUDY: SHX172A

## 2015-01-18 SURGERY — ATRIAL FLUTTER ABLATION
Anesthesia: LOCAL

## 2015-01-18 SURGERY — A-FLUTTER/A-TACH/SVT ABLATION
Anesthesia: Monitor Anesthesia Care

## 2015-01-18 MED ORDER — ONDANSETRON HCL 4 MG/2ML IJ SOLN: 4.0000 mg | Freq: Four times a day (QID) | INTRAMUSCULAR | Status: DC | PRN

## 2015-01-18 MED ORDER — BUPIVACAINE HCL (PF) 0.25 % IJ SOLN
INTRAMUSCULAR | Status: AC
  Filled 2015-01-18: qty 60

## 2015-01-18 MED ORDER — DILTIAZEM HCL ER BEADS 300 MG PO CP24
300.0000 mg | ORAL_CAPSULE | Freq: Every morning | ORAL | Status: DC
  Administered 2015-01-18: 300 mg via ORAL
  Filled 2015-01-18: qty 1

## 2015-01-18 MED ORDER — GABAPENTIN 800 MG PO TABS
800.0000 mg | ORAL_TABLET | Freq: Two times a day (BID) | ORAL | Status: DC
  Filled 2015-01-18 (×2): qty 1

## 2015-01-18 MED ORDER — SODIUM CHLORIDE 0.9 % IJ SOLN
3.0000 mL | Freq: Two times a day (BID) | INTRAMUSCULAR | Status: DC
  Administered 2015-01-18: 3 mL via INTRAVENOUS

## 2015-01-18 MED ORDER — DOCUSATE SODIUM 100 MG PO CAPS: 100.0000 mg | ORAL_CAPSULE | Freq: Two times a day (BID) | ORAL | Status: DC | PRN

## 2015-01-18 MED ORDER — FENTANYL CITRATE (PF) 100 MCG/2ML IJ SOLN
INTRAMUSCULAR | Status: AC
  Filled 2015-01-18: qty 2

## 2015-01-18 MED ORDER — MIDAZOLAM HCL 5 MG/5ML IJ SOLN
INTRAMUSCULAR | Status: AC
  Filled 2015-01-18: qty 5

## 2015-01-18 MED ORDER — ACETAMINOPHEN 325 MG PO TABS: 650.0000 mg | ORAL_TABLET | ORAL | Status: DC | PRN

## 2015-01-18 MED ORDER — FENTANYL CITRATE (PF) 100 MCG/2ML IJ SOLN
INTRAMUSCULAR | Status: DC | PRN
  Administered 2015-01-18 (×4): 12.5 ug via INTRAVENOUS
  Administered 2015-01-18: 25 ug via INTRAVENOUS
  Administered 2015-01-18: 12.5 ug via INTRAVENOUS
  Administered 2015-01-18: 25 ug via INTRAVENOUS
  Administered 2015-01-18: 12.5 ug via INTRAVENOUS

## 2015-01-18 MED ORDER — SODIUM CHLORIDE 0.9 % IJ SOLN: 3.0000 mL | INTRAMUSCULAR | Status: DC | PRN

## 2015-01-18 MED ORDER — POLYETHYLENE GLYCOL 3350 17 G PO PACK
17.0000 g | PACK | Freq: Every day | ORAL | Status: DC
  Filled 2015-01-18: qty 1

## 2015-01-18 MED ORDER — METOPROLOL TARTRATE 12.5 MG HALF TABLET
12.5000 mg | ORAL_TABLET | Freq: Two times a day (BID) | ORAL | Status: DC
  Administered 2015-01-18: 12.5 mg via ORAL
  Filled 2015-01-18 (×2): qty 1

## 2015-01-18 MED ORDER — RIVAROXABAN 20 MG PO TABS
20.0000 mg | ORAL_TABLET | Freq: Every day | ORAL | Status: DC
  Administered 2015-01-18: 20 mg via ORAL
  Filled 2015-01-18: qty 1

## 2015-01-18 MED ORDER — SODIUM CHLORIDE 0.9 % IV SOLN
INTRAVENOUS | Status: DC
  Administered 2015-01-18: 06:00:00 via INTRAVENOUS

## 2015-01-18 MED ORDER — SODIUM CHLORIDE 0.9 % IV SOLN: 250.0000 mL | INTRAVENOUS | Status: DC | PRN

## 2015-01-18 MED ORDER — PANTOPRAZOLE SODIUM 40 MG PO TBEC
40.0000 mg | DELAYED_RELEASE_TABLET | Freq: Every day | ORAL | Status: DC
  Administered 2015-01-18: 40 mg via ORAL
  Filled 2015-01-18: qty 1

## 2015-01-18 MED ORDER — HEPARIN (PORCINE) IN NACL 2-0.9 UNIT/ML-% IJ SOLN
INTRAMUSCULAR | Status: AC
  Filled 2015-01-18: qty 500

## 2015-01-18 MED ORDER — MORPHINE SULFATE ER 15 MG PO TBCR
15.0000 mg | EXTENDED_RELEASE_TABLET | Freq: Two times a day (BID) | ORAL | Status: DC
  Administered 2015-01-18: 15 mg via ORAL
  Filled 2015-01-18: qty 1

## 2015-01-18 MED ORDER — DILTIAZEM HCL ER BEADS 120 MG PO CP24
120.0000 mg | ORAL_CAPSULE | Freq: Every evening | ORAL | Status: DC
  Administered 2015-01-18: 120 mg via ORAL
  Filled 2015-01-18: qty 1

## 2015-01-18 MED ORDER — TAMSULOSIN HCL 0.4 MG PO CAPS
0.4000 mg | ORAL_CAPSULE | Freq: Every day | ORAL | Status: DC
  Administered 2015-01-18: 0.4 mg via ORAL
  Filled 2015-01-18: qty 1

## 2015-01-18 MED ORDER — GABAPENTIN 400 MG PO CAPS
800.0000 mg | ORAL_CAPSULE | Freq: Two times a day (BID) | ORAL | Status: DC
  Administered 2015-01-18: 800 mg via ORAL
  Filled 2015-01-18 (×2): qty 2

## 2015-01-18 MED ORDER — MIDAZOLAM HCL 5 MG/5ML IJ SOLN
INTRAMUSCULAR | Status: DC | PRN
  Administered 2015-01-18: 1 mg via INTRAVENOUS
  Administered 2015-01-18: 2 mg via INTRAVENOUS
  Administered 2015-01-18 (×7): 1 mg via INTRAVENOUS

## 2015-01-18 MED ORDER — GABAPENTIN 400 MG PO CAPS
400.0000 mg | ORAL_CAPSULE | Freq: Two times a day (BID) | ORAL | Status: DC
  Filled 2015-01-18 (×2): qty 1

## 2015-01-18 MED ORDER — HYDROCORTISONE 2.5 % RE CREA
1.0000 "application " | TOPICAL_CREAM | Freq: Two times a day (BID) | RECTAL | Status: DC | PRN
  Filled 2015-01-18: qty 28.35

## 2015-01-18 SURGICAL SUPPLY — 11 items
BAG SNAP BAND KOVER 36X36 (MISCELLANEOUS) ×1 IMPLANT
CATH CELSIUS FLTR 8MM (ABLATOR) ×1 IMPLANT
CATH HEX JOSEPH 2-5-2 65CM 6F (CATHETERS) ×1 IMPLANT
CATH JOSEPHSON QUAD-ALLRED 6FR (CATHETERS) ×1 IMPLANT
PACK EP LATEX FREE (CUSTOM PROCEDURE TRAY) ×2
PACK EP LF (CUSTOM PROCEDURE TRAY) IMPLANT
PAD DEFIB LIFELINK (PAD) ×1 IMPLANT
SHEATH PINNACLE 6F 10CM (SHEATH) ×2 IMPLANT
SHEATH PINNACLE 7F 10CM (SHEATH) ×1 IMPLANT
SHEATH PINNACLE 8F 10CM (SHEATH) ×1 IMPLANT
SHIELD RADPAD SCOOP 12X17 (MISCELLANEOUS) ×1 IMPLANT

## 2015-01-18 NOTE — Progress Notes (Signed)
Site area: right Internal Jugular a 7 french venous sheath was removed  Site Prior to Removal:  Level 0  Pressure Applied For 10 MINUTES    Minutes Beginning at 0940a  Manual:   Yes.    Patient Status During Pull:  stable  Post Pull Groin Site:  Level 0  Post Pull Instructions Given:  Yes.    Post Pull Pulses Present:  Yes.    Dressing Applied:  Yes.    Comments:  VS remain stable during sheath pull.  Pt denies any discomfort at this time at site.

## 2015-01-18 NOTE — Progress Notes (Signed)
Discharge instructions given and signed, IV dc'd.  Pt discharge to daughter via W/C.

## 2015-01-18 NOTE — H&P (View-Only) (Signed)
HPI [name] returns today for followup. He is a very pleasant 74 year old man with a history of symptomatic bradycardia, paroxysmal atrial fibrillation and persistent atrial flutter, borderline hypertension, status post permanent pacemaker insertion. The patient wears CPAP at home. He notes that times he feels like he is having increasingly difficult time breathing. He does admit to dietary indiscretion. The patient has not been exercising, and has gained weight in the last several months. No peripheral edema. No palpitations. Allergies  Allergen Reactions  . Penicillins     REACTION: rash  . Shellfish Allergy Hives    Accelerated allergic reaction; questionable laryngospasm or laryngeal edema with some difficulty breathing prompting ED evaluation and treatment at [facility]     Current Outpatient Prescriptions  Medication Sig Dispense Refill  . Calcium Carbonate-Vitamin D (CALCIUM-VITAMIN D3) 600-125 MG-UNIT TABS Take 1,200 mg by mouth 2 (two) times daily.     Marland Kitchen diltiazem (TIAZAC) 120 MG 24 hr capsule Take 120 mg by mouth every evening.    . diltiazem (TIAZAC) 300 MG 24 hr capsule Take 300 mg by mouth every morning.    . gabapentin (NEURONTIN) 600 MG tablet Take 800 mg by mouth 2 (two) times daily.     . hydrocortisone (ANUSOL-HC) 2.5 % rectal cream Place 1 application rectally as needed.     . metoprolol tartrate (LOPRESSOR) 25 MG tablet TAKE ONE-HALF TABLET BY MOUTH TWICE DAILY 90 tablet 3  . morphine (MSIR) 15 MG tablet Take 15 mg by mouth 2 (two) times daily.    . pantoprazole (PROTONIX) 40 MG tablet Take 40 mg by mouth daily.    . polyethylene glycol (MIRALAX / GLYCOLAX) packet Take 17 g by mouth at bedtime.     . rivaroxaban (XARELTO) 20 MG TABS tablet Take 1 tablet (20 mg total) by mouth daily with supper. 90 tablet 2  . Tamsulosin HCl (FLOMAX) 0.4 MG CAPS Take by mouth daily.       No current facility-administered medications for this visit.     Past Medical History   Diagnosis Date  . General weakness     +malaise, exercise intolerance, and near syncope  . Atrial flutter     w sick sinus syndrome; dual-chamber pacing initiated in 05/2007; normal echocardiogram in 2009  . Hypertension   . BPH (benign prostatic hyperplasia)   . Cerebrovascular disease     carotid bruit; no focal stenosis by carotid ultrasound in 05/2008  . Low back pain     history of traumax2    ROS:   All systems reviewed and negative except as noted in the HPI.   Past Surgical History  Procedure Laterality Date  . Cervical discectomy  2006  . Cataract extraction      Bilateral     No family history on file.   History   Social History  . Marital Status: Divorced    Spouse Name: N/A  . Number of Children: N/A  . Years of Education: N/A   Occupational History  . retired    Social History Main Topics  . Smoking status: Former Smoker -- 1.00 packs/day for .5 years    Types: Cigarettes    Quit date: 03/27/1962  . Smokeless tobacco: Never Used  . Alcohol Use: No  . Drug Use: No  . Sexual Activity: Not on file   Other Topics Concern  . Not on file   Social History Narrative   Divorced with 3 adult children. Semi retired.      BP  116/70 mmHg  Pulse 70  Ht 5\' 8"  (1.727 m)  Wt 239 lb 6 oz (108.58 kg)  BMI 36.41 kg/m2  Physical Exam:  Well appearing 74 year old man,NAD HEENT: Unremarkable Neck:  6 cm JVD, no thyromegally Lungs:  Clear, with no wheezes, rales, or rhonchi. HEART:  Regular rate rhythm, no murmurs, no rubs, no clicks Abd:  soft, obese,positive bowel sounds, no organomegally, no rebound, no guarding Ext:  2 plus pulses, no edema, no cyanosis, no clubbing Skin:  No rashes no nodules Neuro:  CN II through XII intact, motor grossly intact  ECG - atrial flutter with ventricular pacing  DEVICE  Normal device function.  See PaceArt for details.   Assess/Plan:

## 2015-01-18 NOTE — Interval H&P Note (Signed)
History and Physical Interval Note:  01/18/2015 7:30 AM  [name].  has presented today for surgery, with the diagnosis of aflutter  The various methods of treatment have been discussed with the patient and family. After consideration of risks, benefits and other options for treatment, the patient has consented to  Procedure(s): A-Flutter/A-Tach/Svt Ablation (N/A) as a surgical intervention .  The patient's history has been reviewed, patient examined, no change in status, stable for surgery.  I have reviewed the patient's chart and labs.  Questions were answered to the patient's satisfaction.     Mikle Bosworth.D.

## 2015-01-18 NOTE — Progress Notes (Signed)
Site area: rt groin Site Prior to Removal:  Level  0 Pressure Applied For:  15 minutes Manual:   yes Patient Status During Pull:  stable Post Pull Site:  Level  0 Post Pull Instructions Given:  yes Post Pull Pulses Present: yes Dressing Applied:  Small tegaderm Bedrest begins @  1000 Comments: none

## 2015-01-18 NOTE — Interval H&P Note (Signed)
History and Physical Interval Note: CHADSVASC equal 2.  01/18/2015 10:01 PM  [name].  has presented today for surgery, with the diagnosis of aflutter  The various methods of treatment have been discussed with the patient and family. After consideration of risks, benefits and other options for treatment, the patient has consented to  Procedure(s): A-Flutter/A-Tach/Svt Ablation (N/A) as a surgical intervention .  The patient's history has been reviewed, patient examined, no change in status, stable for surgery.  I have reviewed the patient's chart and labs.  Questions were answered to the patient's satisfaction.     [name]

## 2015-01-19 ENCOUNTER — Encounter (HOSPITAL_COMMUNITY): Payer: Self-pay | Admitting: Internal Medicine

## 2015-01-26 MED FILL — Bupivacaine HCl Preservative Free (PF) Inj 0.25%: INTRAMUSCULAR | Qty: 60 | Status: AC

## 2015-01-26 MED FILL — Heparin Sodium (Porcine) 2 Unit/ML in Sodium Chloride 0.9%: INTRAMUSCULAR | Qty: 500 | Status: AC

## 2015-01-29 ENCOUNTER — Ambulatory Visit: Payer: Medicare Other | Admitting: Internal Medicine

## 2015-02-05 ENCOUNTER — Encounter: Payer: Self-pay | Admitting: Internal Medicine

## 2015-02-18 ENCOUNTER — Encounter (HOSPITAL_COMMUNITY): Payer: Self-pay

## 2015-02-18 ENCOUNTER — Emergency Department (HOSPITAL_COMMUNITY)
Admission: EM | Admit: 2015-02-18 | Discharge: 2015-02-18 | Disposition: A | Discharge status: Home / Self Care | Payer: Medicare Other | Attending: Emergency Medicine | Admitting: Emergency Medicine

## 2015-02-18 ENCOUNTER — Emergency Department (HOSPITAL_COMMUNITY): Payer: Medicare Other

## 2015-02-18 DIAGNOSIS — Z87828 Personal history of other (healed) physical injury and trauma: Secondary | ICD-10-CM | POA: Insufficient documentation

## 2015-02-18 DIAGNOSIS — I483 Typical atrial flutter: Secondary | ICD-10-CM

## 2015-02-18 DIAGNOSIS — Z87891 Personal history of nicotine dependence: Secondary | ICD-10-CM | POA: Diagnosis not present

## 2015-02-18 DIAGNOSIS — I10 Essential (primary) hypertension: Secondary | ICD-10-CM | POA: Diagnosis not present

## 2015-02-18 DIAGNOSIS — Z880 Allergy status to penicillin: Secondary | ICD-10-CM | POA: Insufficient documentation

## 2015-02-18 DIAGNOSIS — N400 Enlarged prostate without lower urinary tract symptoms: Secondary | ICD-10-CM | POA: Diagnosis not present

## 2015-02-18 DIAGNOSIS — Z79899 Other long term (current) drug therapy: Secondary | ICD-10-CM | POA: Insufficient documentation

## 2015-02-18 DIAGNOSIS — Z8673 Personal history of transient ischemic attack (TIA), and cerebral infarction without residual deficits: Secondary | ICD-10-CM | POA: Diagnosis not present

## 2015-02-18 DIAGNOSIS — I484 Atypical atrial flutter: Secondary | ICD-10-CM | POA: Diagnosis not present

## 2015-02-18 DIAGNOSIS — R079 Chest pain, unspecified: Secondary | ICD-10-CM | POA: Diagnosis not present

## 2015-02-18 LAB — CBC WITH DIFFERENTIAL/PLATELET
Basophils Absolute: 0 10*3/uL (ref 0.0–0.1)
Basophils Relative: 1 % (ref 0–1)
EOS ABS: 0.2 10*3/uL (ref 0.0–0.7)
EOS PCT: 2 % (ref 0–5)
HEMATOCRIT: 38.2 % — AB (ref 39.0–52.0)
Hemoglobin: 12.6 g/dL — ABNORMAL LOW (ref 13.0–17.0)
LYMPHS ABS: 2.2 10*3/uL (ref 0.7–4.0)
LYMPHS PCT: 33 % (ref 12–46)
MCH: 31.3 pg (ref 26.0–34.0)
MCHC: 33 g/dL (ref 30.0–36.0)
MCV: 94.8 fL (ref 78.0–100.0)
MONO ABS: 0.5 10*3/uL (ref 0.1–1.0)
MONOS PCT: 8 % (ref 3–12)
Neutro Abs: 3.7 10*3/uL (ref 1.7–7.7)
Neutrophils Relative %: 56 % (ref 43–77)
PLATELETS: 180 10*3/uL (ref 150–400)
RBC: 4.03 MIL/uL — AB (ref 4.22–5.81)
RDW: 13.4 % (ref 11.5–15.5)
WBC: 6.6 10*3/uL (ref 4.0–10.5)

## 2015-02-18 LAB — BASIC METABOLIC PANEL
Anion gap: 9 (ref 5–15)
BUN: 16 mg/dL (ref 6–20)
CO2: 26 mmol/L (ref 22–32)
Calcium: 9 mg/dL (ref 8.9–10.3)
Chloride: 101 mmol/L (ref 101–111)
Creatinine, Ser: 1.16 mg/dL (ref 0.61–1.24)
GFR calc Af Amer: >60 mL/min (ref >60)
GFR calc non Af Amer: >60 mL/min (ref >60)
Glucose, Bld: 108 mg/dL — ABNORMAL HIGH (ref 65–99)
Potassium: 4.4 mmol/L (ref 3.5–5.1)
Sodium: 136 mmol/L (ref 135–145)

## 2015-02-18 LAB — TROPONIN I: Troponin I: <0.03 ng/mL (ref <0.031)

## 2015-02-18 NOTE — ED Notes (Signed)
Pt states he went to the [place] yesterday for a regular check up. States when his EKG was done he was instructed to go to the hospital.

## 2015-02-18 NOTE — ED Notes (Signed)
MD at bedside. 

## 2015-02-18 NOTE — Discharge Instructions (Signed)
Atrial Flutter °Atrial flutter is a heart rhythm that can cause the heart to beat very fast (tachycardia). It originates in the upper chambers of the heart (atria). In atrial flutter, the top chambers of the heart (atria) often beat much faster than the bottom chambers of the heart (ventricles). Atrial flutter has a regular "saw toothed" appearance in an EKG readout. An EKG is a test that records the electrical activity of the heart. Atrial flutter can cause the heart to beat up to 150 beats per minute (BPM). Atrial flutter can either be short lived (paroxysmal) or permanent.  °CAUSES  °Causes of atrial flutter can be many. Some of these include: °· Heart related issues: °¨ Heart attack (myocardial infarction). °¨ Heart failure. °¨ Heart valve problems. °¨ Poorly controlled high blood pressure (hypertension). °¨ After open heart surgery. °· Lung related issues: °¨ A blood clot in the lungs (pulmonary embolism). °¨ Chronic obstructive pulmonary disease (COPD). Medications used to treat COPD can attribute to atrial flutter. °· Other related causes: °¨ Hyperthyroidism. °¨ Caffeine. °¨ Some decongestant cold medications. °¨ Low electrolyte levels such as potassium or magnesium. °¨ Cocaine. °SYMPTOMS °· An awareness of your heart beating rapidly (palpitations). °· Shortness of breath. °· Chest pain. °· Low blood pressure (hypotension). °· Dizziness or fainting. °DIAGNOSIS  °Different tests can be performed to diagnose atrial flutter.  °· An EKG. °· Holter monitor. This is a 24-hour recording of your heart rhythm. You will also be given a diary. Write down all symptoms that you have and what you were doing at the time you experienced symptoms. °· Cardiac event monitor. This small device can be worn for up to 30 days. When you have heart symptoms, you will push a button on the device. This will then record your heart rhythm. °· Echocardiogram. This is an imaging test to look at your heart. Your caregiver will look at your  heart valves and the ventricles. °· Stress test. This test can help determine if the atrial flutter is related to exercise or if coronary artery disease is present. °· Laboratory studies will look at certain blood levels like: °¨ Complete blood count (CBC). °¨ Potassium. °¨ Magnesium. °¨ Thyroid function. °TREATMENT  °Treatment of atrial flutter varies. A combination of therapies may be used or sometimes atrial flutter may need only 1 type of treatment.  °Lab work: °If your blood work, such as your electrolytes (potassium, magnesium) or your thyroid function tests, are abnormal, your caregiver will treat them accordingly.  °Medication:  °There are several different types of medications that can convert your heart to a normal rhythm and prevent atrial flutter from reoccurring.  °Nonsurgical procedures: °Nonsurgical techniques may be used to control atrial flutter. Some examples include: °· Cardioversion. This technique uses either drugs or an electrical shock to restore a normal heart rhythm: °¨ Cardioversion drugs may be given through an intravenous (IV) line to help "reset" the heart rhythm. °¨ In electrical cardioversion, your caregiver shocks your heart with electrical energy. This helps to reset the heartbeat to a normal rhythm. °· Ablation. If atrial flutter is a persistent problem, an ablation may be needed. This procedure is done under mild sedation. High frequency radio-wave energy is used to destroy the area of heart tissue responsible for atrial flutter. °SEEK IMMEDIATE MEDICAL CARE IF:  °You have: °· Dizziness. °· Near fainting or fainting. °· Shortness of breath. °· Chest pain or pressure. °· Sudden nausea or vomiting. °· Profuse sweating. °If you have the above symptoms,   call your local emergency service immediately! Do not drive yourself to the hospital. °MAKE SURE YOU:  °· Understand these instructions. °· Will watch your condition. °· Will get help right away if you are not doing well or get  worse. °Document Released: 01/21/2009 Document Revised: 01/19/2014 Document Reviewed: 01/21/2009 °[employer]® Patient Information ©2015 [employer]. This information is not intended to replace advice given to you by your health care provider. Make sure you discuss any questions you have with your health care provider. ° °

## 2015-02-18 NOTE — ED Provider Notes (Signed)
CSN: [id number]     Arrival date & time 02/18/15  1127 History  This chart was scribed for No att. providers found by Mercy Moore, ED scribe.  This patient was seen in room APA02/APA02 and the patient's care was started at 12:05 PM.   Chief Complaint  Patient presents with  . Chest Pain   The history is provided by the patient. No language interpreter was used.   HPI Comments: [name]. is a 74 y.o. male who presents to the Emergency Department after an abnormal EKG at [place] which revealed "atrial flutter." Patient reports having an ablation three weeks ago. Patient reports that his pulse has been measuring at 40 recently at home, but his pacemaker is set at 70 bpm. Patient denies chest pain currently, but reports that he's been having intermittent headaches and some weakness.     Past Medical History  Diagnosis Date  . General weakness     +malaise, exercise intolerance, and near syncope  . Atrial flutter     w sick sinus syndrome; dual-chamber pacing initiated in 05/2007; normal echocardiogram in 2009  . Hypertension   . BPH (benign prostatic hyperplasia)   . Cerebrovascular disease     carotid bruit; no focal stenosis by carotid ultrasound in 05/2008  . Low back pain     history of traumax2   Past Surgical History  Procedure Laterality Date  . Cervical discectomy  2006  . Cataract extraction      Bilateral  . Electrophysiologic study N/A 01/18/2015    Procedure: A-Flutter/A-Tach/Svt Ablation;  Surgeon: [name], MD;  Location: [facility]AS[facility] CV [facility]UP[facility];  Service: Cardiovascular;  Laterality: N/A;  . Ablation     No family history on file. History  Substance Use Topics  . Smoking status: Former Smoker -- 1.00 packs/day for .5 years    Types: Cigarettes    Quit date: 03/27/1962  . Smokeless tobacco: Never Used  . Alcohol Use: No    Review of Systems  Constitutional: Negative for fever and chills.  HENT: Negative for congestion, rhinorrhea and sore  throat.   Eyes: Negative for visual disturbance.  Respiratory: Negative for cough, shortness of breath and wheezing.   Cardiovascular: Positive for chest pain.  Gastrointestinal: Negative for nausea, vomiting, abdominal pain and diarrhea.  Endocrine: Negative for polyuria.  Genitourinary: Negative for dysuria.  Musculoskeletal: Negative for back pain.  Skin: Negative for rash.  Allergic/Immunologic: Negative for immunocompromised state.  Neurological: Positive for headaches.  Hematological: Does not bruise/bleed easily.  Psychiatric/Behavioral: Negative for confusion.      Allergies  Bee venom; Penicillins; and Shellfish allergy  Home Medications   Prior to Admission medications   Medication Sig Start Date End Date Taking? Authorizing Provider  Calcium Carbonate-Vitamin D (CALCIUM-VITAMIN D3) 600-125 MG-UNIT TABS Take 1,200 mg by mouth 2 (two) times daily.    Yes Historical Provider, MD  diltiazem (TIAZAC) 120 MG 24 hr capsule Take 120 mg by mouth every evening.   Yes Historical Provider, MD  diltiazem (TIAZAC) 300 MG 24 hr capsule Take 300 mg by mouth every morning.   Yes Historical Provider, MD  gabapentin (NEURONTIN) 800 MG tablet Take 800 mg by mouth 2 (two) times daily.   Yes Historical Provider, MD  metoprolol tartrate (LOPRESSOR) 25 MG tablet Take 0.5 tablets (12.5 mg total) by mouth 2 (two) times daily. 01/01/15  Yes [name], MD  morphine (MS CONTIN) 15 MG 12 hr tablet Take 15 mg by  mouth every 12 (twelve) hours.   Yes Historical Provider, MD  pantoprazole (PROTONIX) 40 MG tablet Take 40 mg by mouth daily.   Yes Historical Provider, MD  polyethylene glycol (MIRALAX / GLYCOLAX) packet Take 17 g by mouth at bedtime.    Yes Historical Provider, MD  rivaroxaban (XARELTO) 20 MG TABS tablet Take 1 tablet (20 mg total) by mouth daily with supper. 12/02/14  Yes [name], MD  Tamsulosin HCl (FLOMAX) 0.4 MG CAPS Take 0.4 mg by mouth daily.    Yes Historical Provider, MD   docusate sodium (COLACE) 100 MG capsule Take 100 mg by mouth 2 (two) times daily as needed for mild constipation.    Historical Provider, MD  hydrocortisone (ANUSOL-HC) 2.5 % rectal cream Place 1 application rectally 2 (two) times daily as needed for hemorrhoids or itching.    Historical Provider, MD   Triage Vitals: BP 132/76 mmHg  Temp(Src) 98.7 F (37.1 C)  Resp 20  Ht 5\' 8"  (1.727 m)  Wt 230 lb (104.327 kg)  BMI 34.98 kg/m2  SpO2 96% Physical Exam  Constitutional: He is oriented to person, place, and time. He appears well-developed and well-nourished. No distress.  Awake and appropriate.   HENT:  Head: Normocephalic and atraumatic.  Eyes: EOM are normal.  Neck: Neck supple. No tracheal deviation present.  Cardiovascular: Normal rate.   Pulmonary/Chest: Effort normal and breath sounds normal. No respiratory distress. He has no wheezes. He has no rales.  Musculoskeletal: Normal range of motion. He exhibits no edema.  Neurological: He is alert and oriented to person, place, and time.  Skin: Skin is warm and dry.  Psychiatric: He has a normal mood and affect. His behavior is normal.  Nursing note and vitals reviewed.   ED Course  Procedures (including critical care time)  COORDINATION OF CARE: 12:12 PM- Discussed treatment plan with patient at bedside and patient agreed to plan.   Labs Review Labs Reviewed  CBC WITH DIFFERENTIAL/PLATELET - Abnormal; Notable for the following:    RBC 4.03 (*)    Hemoglobin 12.6 (*)    HCT 38.2 (*)    All other components within normal limits  BASIC METABOLIC PANEL - Abnormal; Notable for the following:    Glucose, Bld 108 (*)    All other components within normal limits  TROPONIN I    Imaging Review Dg Chest Portable 1 View  02/18/2015   CLINICAL DATA:  Chest pain. Report of abnormal electrocardiogram. History of cardiac arrhythmia  EXAM: PORTABLE CHEST - 1 VIEW  COMPARISON:  September 22, 2008  FINDINGS: There is a pacemaker with lead  tips attached to the right atrium and right ventricle. No pneumothorax. Heart is enlarged with pulmonary vascularity within normal limits. No edema or consolidation. There is postoperative change in the lower cervical spine.  IMPRESSION: No edema or consolidation. Stable cardiomegaly. Pacemaker with leads attached to the right heart.   Electronically Signed   By: Lowella Grip III M.D.   On: 02/18/2015 12:15     EKG Interpretation   Date/Time:  Thursday February 18 2015 11:38:59 EDT Ventricular Rate:  75 PR Interval:  58 QRS Duration: 143 QT Interval:  443 QTC Calculation: 495 R Axis:   -64 Text Interpretation:  Electronic ventricular pacemaker Confirmed by  Alvino Chapel  MD, Ovid Curd [phone]) on 02/18/2015 11:42:55 AM        MDM   Final diagnoses:  Typical atrial flutter    Patient with slight chest pain. [facility]up his cardiologist. Cardiologist could not see him in the office was sent to ER. Has pacemaker. Is currently paced rhythm and underlying atrial flutter. Review of EKG from [place] showed paced to be followed by a second ventricular beat. Will follow with his cardiologist.*  I personally performed the services described in this documentation, which was scribed in my presence. The recorded information has been reviewed and is accurate.     Davonna Belling, MD 02/19/15 [phone]

## 2015-03-15 ENCOUNTER — Ambulatory Visit (INDEPENDENT_AMBULATORY_CARE_PROVIDER_SITE_OTHER): Payer: Medicare Other | Admitting: Internal Medicine

## 2015-03-15 ENCOUNTER — Encounter: Payer: Self-pay | Admitting: Internal Medicine

## 2015-03-15 VITALS — BP 160/100 | HR 70 | Ht 68.0 in | Wt 235.0 lb

## 2015-03-15 DIAGNOSIS — I484 Atypical atrial flutter: Secondary | ICD-10-CM

## 2015-03-15 DIAGNOSIS — Z950 Presence of cardiac pacemaker: Secondary | ICD-10-CM

## 2015-03-15 DIAGNOSIS — I480 Paroxysmal atrial fibrillation: Secondary | ICD-10-CM | POA: Diagnosis not present

## 2015-03-15 DIAGNOSIS — I4891 Unspecified atrial fibrillation: Secondary | ICD-10-CM | POA: Insufficient documentation

## 2015-03-15 DIAGNOSIS — I10 Essential (primary) hypertension: Secondary | ICD-10-CM

## 2015-03-15 MED ORDER — FLECAINIDE ACETATE 100 MG PO TABS: 100.0000 mg | ORAL_TABLET | Freq: Two times a day (BID) | ORAL | Status: DC

## 2015-03-15 NOTE — Assessment & Plan Note (Signed)
His St. Jude DDD PM is working normally. Will recheck in several months. 

## 2015-03-15 NOTE — Assessment & Plan Note (Signed)
He has had no recurrent typical atrial flutter but does have left atrial flutter based on positive flutter waves in V1 and the inferior leads. He will continue systemic anticoagulation.

## 2015-03-15 NOTE — Assessment & Plan Note (Signed)
His blood pressure is elevated today. Normally at home it is ok but he admits to eating country ham yesterday. He is encouraged to follow his diet.

## 2015-03-15 NOTE — Assessment & Plan Note (Signed)
His ECG and PM interogation both reflect recurrent atrial fib. Longest about 17 hours. I have asked the patient to start flecainide and continue his beta blocker and calcium channel blocker. Will repeat his ECG and have him undergo an exercise stress test in a few weeks.

## 2015-03-15 NOTE — Progress Notes (Signed)
HPI Mr. Vandyken returns today for followup. He is a very pleasant 74 year old man with a history of symptomatic bradycardia, paroxysmal atrial fibrillation and persistent atrial flutter, borderline hypertension, status post permanent pacemaker insertion. The patient wears CPAP at home. He was seen by me 2 months ago with typical atrial flutter and has undergone cathter ablation. He did well for 3 weeks but then developed sob and went to his primary care clinic at the [facility] and was found to have atypical atrial flutter. He was then seen in our ER with atrial fib.  No peripheral edema. No palpitations. Allergies  Allergen Reactions  . Bee Venom Swelling  . Penicillins     REACTION: rash  . Shellfish Allergy Hives    Accelerated allergic reaction; questionable laryngospasm or laryngeal edema with some difficulty breathing prompting ED evaluation and treatment at [facility]     Current Outpatient Prescriptions  Medication Sig Dispense Refill  . Calcium Carbonate-Vitamin D (CALCIUM-VITAMIN D3) 600-125 MG-UNIT TABS Take 1,200 mg by mouth 2 (two) times daily.     Marland Kitchen diltiazem (TIAZAC) 120 MG 24 hr capsule Take 120 mg by mouth every evening.    . diltiazem (TIAZAC) 300 MG 24 hr capsule Take 300 mg by mouth every morning.    . docusate sodium (COLACE) 100 MG capsule Take 100 mg by mouth 2 (two) times daily as needed for mild constipation.    . gabapentin (NEURONTIN) 800 MG tablet Take 800 mg by mouth 2 (two) times daily.    . hydrocortisone (ANUSOL-HC) 2.5 % rectal cream Place 1 application rectally 2 (two) times daily as needed for hemorrhoids or itching.    . metoprolol tartrate (LOPRESSOR) 25 MG tablet Take 0.5 tablets (12.5 mg total) by mouth 2 (two) times daily. 90 tablet 3  . morphine (MS CONTIN) 15 MG 12 hr tablet Take 15 mg by mouth every 12 (twelve) hours.    . pantoprazole (PROTONIX) 40 MG tablet Take 40 mg by mouth daily.    . polyethylene glycol (MIRALAX / GLYCOLAX) packet Take 17 g by mouth at  bedtime.     . psyllium (METAMUCIL) 58.6 % powder Take 1 packet by mouth 1 day or 1 dose.    . rivaroxaban (XARELTO) 20 MG TABS tablet Take 1 tablet (20 mg total) by mouth daily with supper. 90 tablet 2  . Tamsulosin HCl (FLOMAX) 0.4 MG CAPS Take 0.4 mg by mouth daily.      No current facility-administered medications for this visit.     Past Medical History  Diagnosis Date  . General weakness     +malaise, exercise intolerance, and near syncope  . Atrial flutter     w sick sinus syndrome; dual-chamber pacing initiated in 05/2007; normal echocardiogram in 2009  . Hypertension   . BPH (benign prostatic hyperplasia)   . Cerebrovascular disease     carotid bruit; no focal stenosis by carotid ultrasound in 05/2008  . Low back pain     history of traumax2    ROS:   All systems reviewed and negative except as noted in the HPI.   Past Surgical History  Procedure Laterality Date  . Cervical discectomy  2006  . Cataract extraction      Bilateral  . Electrophysiologic study N/A 01/18/2015    Procedure: A-Flutter/A-Tach/Svt Ablation;  Surgeon: [name], MD;  Location: [facility] INVASIVE CV LAB CUPID;  Service: Cardiovascular;  Laterality: N/A;  . Ablation       No family history on file.  History   Social History  . Marital Status: Divorced    Spouse Name: N/A  . Number of Children: N/A  . Years of Education: N/A   Occupational History  . retired    Social History Main Topics  . Smoking status: Former Smoker -- 1.00 packs/day for .5 years    Types: Cigarettes    Quit date: 03/27/1962  . Smokeless tobacco: Never Used  . Alcohol Use: No  . Drug Use: No  . Sexual Activity: Not on file   Other Topics Concern  . Not on file   Social History Narrative   Divorced with 3 adult children. Semi retired.      BP 160/100 mmHg  Pulse 70  Ht 5\' 8"  (1.727 m)  Wt 235 lb (106.595 kg)  BMI 35.74 kg/m2  SpO2 93%  Physical Exam:  Well appearing 74 year old man,NAD HEENT:  Unremarkable Neck:  6 cm JVD, no thyromegally Lungs:  Clear, with no wheezes, rales, or rhonchi. HEART:  Regular rate rhythm, no murmurs, no rubs, no clicks Abd:  soft, obese,positive bowel sounds, no organomegally, no rebound, no guarding Ext:  2 plus pulses, no edema, no cyanosis, no clubbing Skin:  No rashes no nodules Neuro:  CN II through XII intact, motor grossly intact  ECG - reviewed from ER - atrial fib with intermittent ventricular pacing.   DEVICE  Normal device function.  See PaceArt for details.   Assess/Plan:

## 2015-03-15 NOTE — Patient Instructions (Signed)
Your physician recommends that you schedule a follow-up appointment in: 1 week for an EKG   Your physician has requested that you have an exercise tolerance test in 2 weeks. For further information please visit [email or web address]. Please also follow instruction sheet, as given.  Your physician recommends that you schedule a follow-up appointment in: 6 8 weeks with Dr. Lovena Le.   Your physician has recommended you make the following change in your medication:   START TAKING  Flecainide 100 mg Two Times Daily   Thank you for choosing [facility]!

## 2015-03-23 ENCOUNTER — Ambulatory Visit (INDEPENDENT_AMBULATORY_CARE_PROVIDER_SITE_OTHER): Payer: Medicare Other

## 2015-03-23 DIAGNOSIS — Z79899 Other long term (current) drug therapy: Secondary | ICD-10-CM

## 2015-03-23 NOTE — Progress Notes (Signed)
Started on Flecainide 100 mg BID on 03/15/2015 by Dr Lovena Le.returns for EKG.Pt states he feels well, EKG is paced rhythm,no fib noted    Will forward note/EKG  to [employer]

## 2015-03-29 ENCOUNTER — Ambulatory Visit (HOSPITAL_COMMUNITY)
Admission: RE | Admit: 2015-03-29 | Discharge: 2015-03-29 | Disposition: A | Discharge status: Home / Self Care | Payer: Medicare Other | Source: Ambulatory Visit | Attending: Internal Medicine | Admitting: Internal Medicine

## 2015-03-29 DIAGNOSIS — I484 Atypical atrial flutter: Secondary | ICD-10-CM | POA: Diagnosis not present

## 2015-03-29 LAB — EXERCISE TOLERANCE TEST
CHL CUP MPHR: 147 {beats}/min
CHL CUP RESTING HR STRESS: 70 {beats}/min
CSEPED: 4 min
Estimated workload: 7 METS
Exercise duration (sec): 21 s
Peak HR: 95 {beats}/min
Percent HR: 64 %
RPE: 13

## 2015-03-30 DIAGNOSIS — G4733 Obstructive sleep apnea (adult) (pediatric): Secondary | ICD-10-CM | POA: Diagnosis not present

## 2015-04-05 ENCOUNTER — Telehealth: Payer: Self-pay | Admitting: Internal Medicine

## 2015-04-05 NOTE — Telephone Encounter (Signed)
I will forward to Dr.Taylor °

## 2015-04-05 NOTE — Telephone Encounter (Signed)
Patient states that he was started on Flecanide and now he is having swelling and pain in his legs. Wants to know if medicine could be causing it. / tg

## 2015-04-06 ENCOUNTER — Other Ambulatory Visit (HOSPITAL_COMMUNITY)
Admission: RE | Admit: 2015-04-06 | Discharge: 2015-04-06 | Disposition: A | Discharge status: Home / Self Care | Payer: Medicare Other | Source: Ambulatory Visit | Attending: Physician Assistant | Admitting: Physician Assistant

## 2015-04-06 ENCOUNTER — Ambulatory Visit (INDEPENDENT_AMBULATORY_CARE_PROVIDER_SITE_OTHER): Payer: Medicare Other | Admitting: Physician Assistant

## 2015-04-06 ENCOUNTER — Encounter: Payer: Self-pay | Admitting: Physician Assistant

## 2015-04-06 VITALS — BP 122/72 | HR 82 | Wt 240.2 lb

## 2015-04-06 DIAGNOSIS — I10 Essential (primary) hypertension: Secondary | ICD-10-CM | POA: Diagnosis not present

## 2015-04-06 DIAGNOSIS — M79606 Pain in leg, unspecified: Secondary | ICD-10-CM | POA: Insufficient documentation

## 2015-04-06 DIAGNOSIS — I480 Paroxysmal atrial fibrillation: Secondary | ICD-10-CM

## 2015-04-06 DIAGNOSIS — I4892 Unspecified atrial flutter: Secondary | ICD-10-CM

## 2015-04-06 DIAGNOSIS — I495 Sick sinus syndrome: Secondary | ICD-10-CM | POA: Diagnosis not present

## 2015-04-06 DIAGNOSIS — G4733 Obstructive sleep apnea (adult) (pediatric): Secondary | ICD-10-CM

## 2015-04-06 LAB — BASIC METABOLIC PANEL
Anion gap: 7 (ref 5–15)
BUN: 17 mg/dL (ref 6–20)
CALCIUM: 9.4 mg/dL (ref 8.9–10.3)
CO2: 26 mmol/L (ref 22–32)
CREATININE: 1.17 mg/dL (ref 0.61–1.24)
Chloride: 104 mmol/L (ref 101–111)
Glucose, Bld: 129 mg/dL — ABNORMAL HIGH (ref 65–99)
Potassium: 4.5 mmol/L (ref 3.5–5.1)
SODIUM: 137 mmol/L (ref 135–145)

## 2015-04-06 NOTE — Telephone Encounter (Signed)
Unfortunately I have not heard from Dr Sherron Ales scheduled him to see PA Melina Copa today at 1 pm

## 2015-04-06 NOTE — Patient Instructions (Signed)
Your physician recommends that you schedule a follow-up appointment with Dr. Lovena Le as planned.   Your physician recommends that you return for lab work in: Today (BMET, CK)  We will await Dr. Tanna Furry decision about your stress test.   Please contact Dr. Willey Blade to discuss your knee pain.   Thank you for choosing [facility]!

## 2015-04-06 NOTE — Progress Notes (Signed)
Cardiology Office Note Date:  04/06/2015  Patient ID:  Gregory Plata., DOB [birth year], MRN [id number] PCP:  [name], MD  Cardiologist:  Dr. Lovena Le  Chief Complaint: leg pain  History of Present Illness: Gregory Heinecke. is a 74 y.o. male with history of borderline HTN, OSA on CPAP, BPH, paroxysmal atrial fib, paroxysmal atrial flutter, tachy-brady s/p St. Jude pacemaker 2009 who presented to clinic today for evaluation of leg pain. Per review of chart he was seen by Dr. Lovena Le earlier this year ith typical atrial flutter and has undergone cathter ablation. He did well for 3 weeks but then developed sob and went to his primary care clinic at the [facility] and was found to have atypical atrial flutter. He was then seen in our ER with atrial fib. Dr. Lovena Le recommended he start flecainide and arranged for ETT. This was performed 03/29/15 but the patient did not reach target HR. Per report, "AV paced at baseline. With exercise remained atrial paced but native QRS appeared. Native QRS with exercise measured 80 ms on flecanide. T-wave inversions in the inferior and lateral precordial leads of unclear significance during exercise with patient's native QRS. Cannot compare to baseline as his baseline is ventricular paced. Cannot rule out ischemia based on exercise stress testing. If clinically indicated consider Lexiscan." Last available echo 2009: no RWMA, mildly dilated LA, mildly calcified AV.  He comes in today to discuss knee pain that he has been having. For the last several days he's noticed intermittent left medial knee pain, tender to the touch and worse with walking. There is no swelling, redness, or calf pain. He has history of lumbar disease with chronic back and leg pain but this is a little more localized. He has been compliant with his anticoagulation. He read on the flecainide packet that it may cause leg pain so he called [place] to be checked out today. He says he is feeling great on the  flecainide otherwise. No CP, SOB, or palpitations.   Past Medical History  Diagnosis Date  . General weakness     +malaise, exercise intolerance, and near syncope  . Atrial flutter     a. s/p ablation 01/2015.  Marland Kitchen Hypertension   . BPH (benign prostatic hyperplasia)   . Cerebrovascular disease     carotid bruit; no focal stenosis by carotid ultrasound in 05/2008  . Low back pain     history of traumax2  . Tachy-brady syndrome     a. s/p St. Jude pacemaker 2009.  Marland Kitchen Paroxysmal atrial fibrillation   . Sleep apnea     Past Surgical History  Procedure Laterality Date  . Cervical discectomy  2006  . Cataract extraction      Bilateral  . Electrophysiologic study N/A 01/18/2015    Procedure: A-Flutter/A-Tach/Svt Ablation;  Surgeon: [name], MD;  Location: [place] INVASIVE CV LAB CUPID;  Service: Cardiovascular;  Laterality: N/A;  . Ablation      Current Outpatient Prescriptions  Medication Sig Dispense Refill  . Calcium Carbonate-Vitamin D (CALCIUM-VITAMIN D3) 600-125 MG-UNIT TABS Take 1,200 mg by mouth 2 (two) times daily.     Marland Kitchen diltiazem (TIAZAC) 120 MG 24 hr capsule Take 120 mg by mouth every evening.    . diltiazem (TIAZAC) 300 MG 24 hr capsule Take 300 mg by mouth every morning.    . docusate sodium (COLACE) 100 MG capsule Take 100 mg by mouth 2 (two) times daily as needed for mild constipation.    Marland Kitchen  flecainide (TAMBOCOR) 100 MG tablet Take 1 tablet (100 mg total) by mouth 2 (two) times daily. 180 tablet 3  . gabapentin (NEURONTIN) 800 MG tablet Take 800 mg by mouth 2 (two) times daily.    . hydrocortisone (ANUSOL-HC) 2.5 % rectal cream Place 1 application rectally 2 (two) times daily as needed for hemorrhoids or itching.    . metoprolol tartrate (LOPRESSOR) 25 MG tablet Take 0.5 tablets (12.5 mg total) by mouth 2 (two) times daily. 90 tablet 3  . morphine (MS CONTIN) 15 MG 12 hr tablet Take 15 mg by mouth every 12 (twelve) hours.    . pantoprazole (PROTONIX) 40 MG tablet Take 40  mg by mouth daily.    . polyethylene glycol (MIRALAX / GLYCOLAX) packet Take 17 g by mouth at bedtime.     . psyllium (METAMUCIL) 58.6 % powder Take 1 packet by mouth 1 day or 1 dose.    . rivaroxaban (XARELTO) 20 MG TABS tablet Take 1 tablet (20 mg total) by mouth daily with supper. 90 tablet 2  . Tamsulosin HCl (FLOMAX) 0.4 MG CAPS Take 0.4 mg by mouth daily.      No current facility-administered medications for this visit.    Allergies:   Bee venom; Penicillins; and Shellfish allergy   Social History:  The patient  reports that he quit smoking about 53 years ago. His smoking use included Cigarettes. He has a .5 pack-year smoking history. He has never used smokeless tobacco. He reports that he does not drink alcohol or use illicit drugs.   Family History:  The patient's family history includes Hypertension in an other family member.  ROS:  Please see the history of present illness.    All other systems are reviewed and otherwise negative.   PHYSICAL EXAM:  VS:  BP 122/72 mmHg  Pulse 82  Wt 240 lb 3.2 oz (108.954 kg)  SpO2 95% BMI: Body mass index is 36.53 kg/(m^2). Well nourished, well developed [name], in no acute distress. In good spirits. HEENT: normocephalic, atraumatic Neck: no JVD, carotid bruits or masses Cardiac:  normal S1, S2; RRR; no murmurs, rubs, or gallops Lungs:  clear to auscultation bilaterally, no wheezing, rhonchi or rales Abd: soft, nontender, no hepatomegaly, + BS MS: no deformity or atrophy. Full ROM bilaterally and strength in tact bilaterally. No joint swelling, erythema or edema bilaterally. 2+ dorsalis pedis and tibialis pulses. Warm, with normal hair growth, not violaceous in color. Medial knee is mildly tender to forceful palpation. No posterior knee masses or swelling. No palpable cords. Ext: no edema Skin: warm and dry, no rash Neuro:  moves all extremities spontaneously, no focal abnormalities noted, follows commands Psych: euthymic mood, full  affect   Recent Labs: 02/18/2015: BUN 16; Creatinine, Ser 1.16; Hemoglobin 12.6*; Platelets 180; Potassium 4.4; Sodium 136  No results found for requested labs within last 365 days.   CrCl cannot be calculated (Patient has no serum creatinine result on file.).   Wt Readings from Last 3 Encounters:  04/06/15 240 lb 3.2 oz (108.954 kg)  03/15/15 235 lb (106.595 kg)  02/18/15 230 lb (104.327 kg)     Other studies reviewed: Additional studies/records reviewed today include: summarized above  ASSESSMENT AND PLAN:  1. Knee pain - atypical for any cardiac pathology. Pulses are in tact and he has no evidence of leg ischemia at this time. He is fully anticoagulated. There is no swelling, redness or warmth. No evidence of joint effusion. Will check BMET to ensure normal  potassium and CK to rule out any myopathy but suspect this is a MSK issue. ?Meniscal problem. He denies any known injury but is no stranger to orthopedic issues. I cannot find any information on Epocrates or UpToDate about focal orthopedic issues caused by flecainide. I have asked him to please contact his PCP to discuss further orthopedic workup of knee pain. 2. PAF/paroxysmal atrial flutter - recently placed on flecainide and feeling well on this. We are awaiting word from Dr. Lovena Le regarding ETT result - he may need Lexiscan nuc but will defer to MD. He denies any anginal symptoms, recurrent palpitations or syncope. Continue diltiazem. 3. Tachy-brady syndrome s/p St. Jude pacemaker - followed by Dr. Lovena Le. 4. Obstructive sleep apnea - continue CPAP. 5. Hypertension - controlled.  Disposition: F/u with Dr. Lovena Le as previously planned.  Current medicines are reviewed at length with the patient today.  The patient did not have any concerns regarding medicines.  Signed, Melina Copa PA-C 04/06/2015 12:57 PM     [facility] Location [address] [place],  [address] [phone]

## 2015-04-08 ENCOUNTER — Telehealth: Payer: Self-pay

## 2015-04-08 ENCOUNTER — Encounter: Payer: Self-pay | Admitting: Internal Medicine

## 2015-04-08 DIAGNOSIS — R350 Frequency of micturition: Secondary | ICD-10-CM | POA: Diagnosis not present

## 2015-04-08 DIAGNOSIS — M791 Myalgia, unspecified site: Secondary | ICD-10-CM

## 2015-04-08 DIAGNOSIS — M25562 Pain in left knee: Secondary | ICD-10-CM | POA: Diagnosis not present

## 2015-04-08 DIAGNOSIS — Z6837 Body mass index (BMI) 37.0-37.9, adult: Secondary | ICD-10-CM | POA: Diagnosis not present

## 2015-04-08 NOTE — Telephone Encounter (Signed)
-----   Message from Katrine Coho, RN sent at 04/08/2015 11:54 AM EDT -----   ----- Message -----    From: Charlie Pitter, PA-C    Sent: 04/07/2015   7:27 PM      To: [facility] Triage  Labs look okay except blood sugar slightly elevated - needs to f/u PCP for this. Potassium was normal which is what we were looking at. He was also supposed to have CK drawn with this. Can you look into why this has not yet resulted? ThxLisbeth Renshaw Dunn PA-C

## 2015-04-08 NOTE — Telephone Encounter (Signed)
-----   Message from Charlie Pitter, [place] sent at 04/08/2015  4:36 PM EDT ----- Regarding: RE: why CK not drawn f he has seen his PCP and has another explanation for his knee pain then no.  If he is still having unexplained knee pain, then yes.    ----- Message -----    From: Bernita Raisin, RN    Sent: 04/08/2015   3:47 PM      To: Charlie Pitter, PA-C Subject: why CK not drawn                               Sherry at the lab doesn't have an explanation as to why the blood was not drawn other than we they registered him at the front desk they omitted a number to convert him to a hospital   Encounter. Do you want him to come back and have lab drawn ? ----- Message -----    From: Katrine Coho, RN    Sent: 04/08/2015  11:54 AM      To: [facility] Triage    ----- Message -----    From: Charlie Pitter, PA-C    Sent: 04/07/2015   7:27 PM      To: Rebeca [facility] Triage  Labs look okay except blood sugar slightly elevated - needs to f/u PCP for this. Potassium was normal which is what we were looking at. He was also supposed to have CK drawn with this. Can you look into why this has not yet resulted? ThxLisbeth Renshaw Dunn PA-C

## 2015-04-08 NOTE — Telephone Encounter (Signed)
AP lab checking on issue,will call back-cc

## 2015-04-08 NOTE — Telephone Encounter (Signed)
Had fluid drained from left knee with cortisone shot but his knee still ,pt wants CK drawn, I will send him to [facility] lab

## 2015-04-09 LAB — CK: Total CK: 129 U/L (ref 7–232)

## 2015-04-16 ENCOUNTER — Telehealth: Payer: Self-pay | Admitting: Internal Medicine

## 2015-04-16 DIAGNOSIS — Z6837 Body mass index (BMI) 37.0-37.9, adult: Secondary | ICD-10-CM | POA: Diagnosis not present

## 2015-04-16 DIAGNOSIS — M25562 Pain in left knee: Secondary | ICD-10-CM | POA: Diagnosis not present

## 2015-04-16 DIAGNOSIS — I10 Essential (primary) hypertension: Secondary | ICD-10-CM | POA: Diagnosis not present

## 2015-04-16 NOTE — Telephone Encounter (Signed)
Dr. Brooke Bonito office is sending a fax requesting clearance to go off of blood thinners 7 days prior to surgery. Surgery date is pending Dr. Tanna Furry medication clearance. Once we receive the fax, we will give to Dr.Taylor for approval.

## 2015-04-19 ENCOUNTER — Other Ambulatory Visit (HOSPITAL_COMMUNITY): Payer: Self-pay | Admitting: Orthopaedic Surgery

## 2015-04-19 DIAGNOSIS — M25562 Pain in left knee: Secondary | ICD-10-CM

## 2015-04-21 ENCOUNTER — Other Ambulatory Visit (HOSPITAL_COMMUNITY): Payer: Medicare Other

## 2015-04-21 NOTE — Telephone Encounter (Signed)
Please call pt and review ECC results.

## 2015-04-26 ENCOUNTER — Ambulatory Visit (HOSPITAL_COMMUNITY)
Admission: RE | Admit: 2015-04-26 | Discharge: 2015-04-26 | Disposition: A | Discharge status: Home / Self Care | Payer: Medicare Other | Source: Ambulatory Visit | Attending: Orthopaedic Surgery | Admitting: Orthopaedic Surgery

## 2015-04-26 ENCOUNTER — Telehealth: Payer: Self-pay

## 2015-04-26 ENCOUNTER — Other Ambulatory Visit (HOSPITAL_COMMUNITY): Payer: Self-pay | Admitting: Orthopaedic Surgery

## 2015-04-26 DIAGNOSIS — M25562 Pain in left knee: Secondary | ICD-10-CM

## 2015-04-26 DIAGNOSIS — M179 Osteoarthritis of knee, unspecified: Secondary | ICD-10-CM | POA: Insufficient documentation

## 2015-04-26 DIAGNOSIS — M23222 Derangement of posterior horn of medial meniscus due to old tear or injury, left knee: Secondary | ICD-10-CM | POA: Diagnosis not present

## 2015-04-26 DIAGNOSIS — S83242A Other tear of medial meniscus, current injury, left knee, initial encounter: Secondary | ICD-10-CM | POA: Diagnosis not present

## 2015-04-26 DIAGNOSIS — X58XXXA Exposure to other specified factors, initial encounter: Secondary | ICD-10-CM | POA: Diagnosis not present

## 2015-04-26 MED ORDER — LIDOCAINE HCL (PF) 1 % IJ SOLN
INTRAMUSCULAR | Status: AC
  Filled 2015-04-26: qty 10

## 2015-04-26 MED ORDER — IOHEXOL 300 MG/ML  SOLN
50.0000 mL | Freq: Once | INTRAMUSCULAR | Status: AC | PRN
  Administered 2015-04-26: 20 mL via INTRAVENOUS

## 2015-04-26 NOTE — Telephone Encounter (Signed)
-----   Message from [name], MD sent at 04/24/2015  9:50 AM EDT ----- Regarding: RE: does pt need lexiscan He does not need a nuclear stress test. GT ----- Message -----    From: Bernita Raisin, RN    Sent: 04/02/2015   8:50 AM      To: [name], MD Subject: does pt need lexiscan                          See dr Nelly Laurence nuc report,do u want [place] to schedule lexiscan

## 2015-04-26 NOTE — Telephone Encounter (Signed)
Pt informed that he does not need lexiscan

## 2015-04-28 ENCOUNTER — Telehealth: Payer: Self-pay | Admitting: Internal Medicine

## 2015-04-28 DIAGNOSIS — Z6836 Body mass index (BMI) 36.0-36.9, adult: Secondary | ICD-10-CM | POA: Diagnosis not present

## 2015-04-28 DIAGNOSIS — S83242A Other tear of medial meniscus, current injury, left knee, initial encounter: Secondary | ICD-10-CM | POA: Diagnosis not present

## 2015-04-28 DIAGNOSIS — I10 Essential (primary) hypertension: Secondary | ICD-10-CM | POA: Diagnosis not present

## 2015-04-28 NOTE — Telephone Encounter (Signed)
Pt has to have knee surgery and he needs to know if he can go off his Zarelto

## 2015-04-28 NOTE — Telephone Encounter (Signed)
Spoke with Cecille Rubin at Dr. Brooke Bonito office. Cecille Rubin verified that she received the letter stating how long to hold Xarelto before and after surgery.

## 2015-05-04 ENCOUNTER — Ambulatory Visit (INDEPENDENT_AMBULATORY_CARE_PROVIDER_SITE_OTHER): Payer: Medicare Other | Admitting: Urology

## 2015-05-04 DIAGNOSIS — R351 Nocturia: Secondary | ICD-10-CM

## 2015-05-04 DIAGNOSIS — N401 Enlarged prostate with lower urinary tract symptoms: Secondary | ICD-10-CM | POA: Diagnosis not present

## 2015-05-06 ENCOUNTER — Other Ambulatory Visit: Payer: Self-pay | Admitting: Radiology

## 2015-05-11 DIAGNOSIS — I10 Essential (primary) hypertension: Secondary | ICD-10-CM | POA: Diagnosis not present

## 2015-05-11 DIAGNOSIS — S83249A Other tear of medial meniscus, current injury, unspecified knee, initial encounter: Secondary | ICD-10-CM | POA: Diagnosis not present

## 2015-05-11 DIAGNOSIS — Z6836 Body mass index (BMI) 36.0-36.9, adult: Secondary | ICD-10-CM | POA: Diagnosis not present

## 2015-05-11 DIAGNOSIS — Z8719 Personal history of other diseases of the digestive system: Secondary | ICD-10-CM | POA: Diagnosis not present

## 2015-05-11 DIAGNOSIS — S83242D Other tear of medial meniscus, current injury, left knee, subsequent encounter: Secondary | ICD-10-CM | POA: Diagnosis not present

## 2015-05-13 DIAGNOSIS — R351 Nocturia: Secondary | ICD-10-CM | POA: Diagnosis not present

## 2015-05-20 ENCOUNTER — Encounter: Payer: Self-pay | Admitting: Internal Medicine

## 2015-05-20 ENCOUNTER — Ambulatory Visit (INDEPENDENT_AMBULATORY_CARE_PROVIDER_SITE_OTHER): Payer: Medicare Other | Admitting: Internal Medicine

## 2015-05-20 VITALS — BP 122/72 | HR 71 | Ht 68.0 in | Wt 240.0 lb

## 2015-05-20 DIAGNOSIS — I480 Paroxysmal atrial fibrillation: Secondary | ICD-10-CM | POA: Diagnosis not present

## 2015-05-20 DIAGNOSIS — M5032 Other cervical disc degeneration, mid-cervical region: Secondary | ICD-10-CM | POA: Diagnosis not present

## 2015-05-20 DIAGNOSIS — Z950 Presence of cardiac pacemaker: Secondary | ICD-10-CM

## 2015-05-20 DIAGNOSIS — I495 Sick sinus syndrome: Secondary | ICD-10-CM | POA: Diagnosis not present

## 2015-05-20 DIAGNOSIS — I10 Essential (primary) hypertension: Secondary | ICD-10-CM | POA: Diagnosis not present

## 2015-05-20 DIAGNOSIS — I4892 Unspecified atrial flutter: Secondary | ICD-10-CM | POA: Diagnosis not present

## 2015-05-20 LAB — CUP PACEART INCLINIC DEVICE CHECK
Brady Statistic RV Percent Paced: 49 %
Date Time Interrogation Session: 20160901135420
Lead Channel Pacing Threshold Amplitude: 0.75 V
Lead Channel Pacing Threshold Pulse Width: 0.5 ms
Lead Channel Pacing Threshold Pulse Width: 0.5 ms
Lead Channel Setting Pacing Amplitude: 2 V
MDC IDC MSMT BATTERY IMPEDANCE: 1900 Ohm
MDC IDC MSMT BATTERY VOLTAGE: 2.78 V
MDC IDC MSMT LEADCHNL RA IMPEDANCE VALUE: 381 Ohm
MDC IDC MSMT LEADCHNL RA PACING THRESHOLD AMPLITUDE: 1 V
MDC IDC MSMT LEADCHNL RV IMPEDANCE VALUE: 497 Ohm
MDC IDC MSMT LEADCHNL RV SENSING INTR AMPL: 12 mV
MDC IDC SET LEADCHNL RV PACING AMPLITUDE: 2.5 V
MDC IDC SET LEADCHNL RV PACING PULSEWIDTH: 0.5 ms
MDC IDC SET LEADCHNL RV SENSING SENSITIVITY: 2 mV
MDC IDC STAT BRADY RA PERCENT PACED: 96 %
Pulse Gen Serial Number: 1269181

## 2015-05-20 NOTE — Patient Instructions (Signed)
Your physician wants you to follow-up in: 6 months with Dr. Lovena Le. You will receive a reminder letter in the mail two months in advance. If you don't receive a letter, please call our office to schedule the follow-up appointment.  Your physician wants you to follow-up in: 6 months with the [facility].  You will receive a reminder letter in the mail two months in advance. If you don't receive a letter, please call our office to schedule the follow-up appointment.  You have been given information on the Xarelto assistance program.  Thank you for choosing [facility]!

## 2015-05-20 NOTE — Assessment & Plan Note (Signed)
He is maintaining NSR very nicely on flecainide. He is bothered by the cost of Xarleto. I have offered him the chance to switch but he would like to continue this medication for now.

## 2015-05-20 NOTE — Progress Notes (Signed)
HPI [name] returns today for followup. He is a very pleasant 74 year old man with a history of symptomatic bradycardia, paroxysmal atrial fibrillation and persistent atrial flutter, borderline hypertension, status post permanent pacemaker insertion. The patient wears CPAP at home. He was seen by me 2 months ago with typical atrial flutter and has undergone cathter ablation. He did well for 3 weeks but then developed sob and went to his primary care clinic at the [facility] and was found to have atypical atrial flutter. He was then seen in our ER with atrial fib.  Has been placed on flecainide and returned to NSR. He underwent stress testing demonstrating no stress induced arrhythmias. Ischemia could not be evaluated because of pacing. He had no angina.  Allergies  Allergen Reactions  . Bee Venom Swelling  . Penicillins     REACTION: rash  . Shellfish Allergy Hives    Accelerated allergic reaction; questionable laryngospasm or laryngeal edema with some difficulty breathing prompting ED evaluation and treatment at [facility]     Current Outpatient Prescriptions  Medication Sig Dispense Refill  . Calcium Carbonate-Vitamin D (CALCIUM-VITAMIN D3) 600-125 MG-UNIT TABS Take 1,200 mg by mouth 2 (two) times daily.     . DESMOPRESSIN ACETATE PO Take 0.4 mg by mouth daily.    Marland Kitchen diltiazem (TIAZAC) 120 MG 24 hr capsule Take 120 mg by mouth every evening.    . diltiazem (TIAZAC) 300 MG 24 hr capsule Take 300 mg by mouth every morning.    . docusate sodium (COLACE) 100 MG capsule Take 100 mg by mouth 2 (two) times daily as needed for mild constipation.    . flecainide (TAMBOCOR) 100 MG tablet Take 1 tablet (100 mg total) by mouth 2 (two) times daily. 180 tablet 3  . gabapentin (NEURONTIN) 800 MG tablet Take 800 mg by mouth 2 (two) times daily.    . hydrocortisone (ANUSOL-HC) 2.5 % rectal cream Place 1 application rectally 2 (two) times daily as needed for hemorrhoids or itching.    . metoprolol tartrate  (LOPRESSOR) 25 MG tablet Take 0.5 tablets (12.5 mg total) by mouth 2 (two) times daily. 90 tablet 3  . morphine (MS CONTIN) 15 MG 12 hr tablet Take 15 mg by mouth every 12 (twelve) hours.    . pantoprazole (PROTONIX) 40 MG tablet Take 40 mg by mouth daily.    . polyethylene glycol (MIRALAX / GLYCOLAX) packet Take 17 g by mouth at bedtime.     . psyllium (METAMUCIL) 58.6 % powder Take 1 packet by mouth 1 day or 1 dose.    . rivaroxaban (XARELTO) 20 MG TABS tablet Take 1 tablet (20 mg total) by mouth daily with supper. 90 tablet 2  . Tamsulosin HCl (FLOMAX) 0.4 MG CAPS Take 0.4 mg by mouth daily.      No current facility-administered medications for this visit.     Past Medical History  Diagnosis Date  . General weakness     +malaise, exercise intolerance, and near syncope  . Atrial flutter     a. s/p ablation 01/2015.  Marland Kitchen Hypertension   . BPH (benign prostatic hyperplasia)   . Cerebrovascular disease     carotid bruit; no focal stenosis by carotid ultrasound in 05/2008  . Low back pain     history of traumax2  . Tachy-brady syndrome     a. s/p St. Jude pacemaker 2009.  Marland Kitchen Paroxysmal atrial fibrillation   . Sleep apnea     ROS:   All systems reviewed and  negative except as noted in the HPI.   Past Surgical History  Procedure Laterality Date  . Cervical discectomy  2006  . Cataract extraction      Bilateral  . Electrophysiologic study N/A 01/18/2015    Procedure: A-Flutter/A-Tach/Svt Ablation;  Surgeon: [name], MD;  Location: [place] INVASIVE CV LAB CUPID;  Service: Cardiovascular;  Laterality: N/A;  . Ablation       Family History  Problem Relation Age of Onset  . Hypertension       Social History   Social History  . Marital Status: Divorced    Spouse Name: N/A  . Number of Children: N/A  . Years of Education: N/A   Occupational History  . retired    Social History Main Topics  . Smoking status: Former Smoker -- 1.00 packs/day for .5 years    Types:  Cigarettes    Quit date: 03/27/1962  . Smokeless tobacco: Never Used  . Alcohol Use: No  . Drug Use: No  . Sexual Activity: Not on file   Other Topics Concern  . Not on file   Social History Narrative   Divorced with 3 adult children. Semi retired.      BP 122/72 mmHg  Pulse 71  Ht 5\' 8"  (1.727 m)  Wt 240 lb (108.863 kg)  BMI 36.50 kg/m2  SpO2 94%  Physical Exam:  Well appearing 74 year old man,NAD HEENT: Unremarkable Neck:  6 cm JVD, no thyromegally Lungs:  Clear, with no wheezes, rales, or rhonchi. HEART:  Regular rate rhythm, no murmurs, no rubs, no clicks Abd:  soft, obese,positive bowel sounds, no organomegally, no rebound, no guarding Ext:  2 plus pulses, no edema, no cyanosis, no clubbing Skin:  No rashes no nodules Neuro:  CN II through XII intact, motor grossly intact   DEVICE  Normal device function.  See PaceArt for details.   Assess/Plan:

## 2015-05-20 NOTE — Assessment & Plan Note (Signed)
His St. Jude DDD PM is working normally. Will recheck in several months. 

## 2015-05-20 NOTE — Assessment & Plan Note (Signed)
His blood pressure is well controlled. No change in his meds. He admits to some dietary indiscretion and I have asked him to lose weight.

## 2015-05-25 ENCOUNTER — Other Ambulatory Visit (HOSPITAL_COMMUNITY): Payer: Medicare Other

## 2015-05-27 NOTE — Patient Instructions (Signed)
[name].  05/27/2015     @PREFPERIOPPHARMACY @   Your procedure is scheduled on  06/03/2015  Report to [facility] at  615 A.M.  Call this number if you have problems the morning of surgery:  [phone]   Remember:  Do not eat food or drink liquids after midnight.  Take these medicines the morning of surgery with A SIP OF WATER  Diltiazem, tambocor, neurontin, metoprolol, MS Contin, protonix, flomax.   Do not wear jewelry, make-up or nail polish.  Do not wear lotions, powders, or perfumes.  You may wear deodorant.  Do not shave 48 hours prior to surgery.  Men may shave face and neck.  Do not bring valuables to the hospital.  [facility] is not responsible for any belongings or valuables.  Contacts, dentures or bridgework may not be worn into surgery.  Leave your suitcase in the car.  After surgery it may be brought to your room.  For patients admitted to the hospital, discharge time will be determined by your treatment team.  Patients discharged the day of surgery will not be allowed to drive home.   Name and phone number of your driver:   family Special instructions:  none  Please read over the following fact sheets that you were given. Pain Booklet, Coughing and Deep Breathing, Surgical Site Infection Prevention, Anesthesia Post-op Instructions and Care and Recovery After Surgery      Arthroscopic Procedure, Knee An arthroscopic procedure can find what is wrong with your knee. PROCEDURE Arthroscopy is a surgical technique that allows your orthopedic surgeon to diagnose and treat your knee injury with accuracy. They will look into your knee through a small instrument. This is almost like a small (pencil sized) telescope. Because arthroscopy affects your knee less than open knee surgery, you can anticipate a more rapid recovery. Taking an active role by following your caregiver's instructions will help with rapid and complete recovery. Use crutches, rest,  elevation, ice, and knee exercises as instructed. The length of recovery depends on various factors including type of injury, age, physical condition, medical conditions, and your rehabilitation. Your knee is the joint between the large bones (femur and tibia) in your leg. Cartilage covers these bone ends which are smooth and slippery and allow your knee to bend and move smoothly. Two menisci, thick, semi-lunar shaped pads of cartilage which form a rim inside the joint, help absorb shock and stabilize your knee. Ligaments bind the bones together and support your knee joint. Muscles move the joint, help support your knee, and take stress off the joint itself. Because of this all programs and physical therapy to rehabilitate an injured or repaired knee require rebuilding and strengthening your muscles. AFTER THE PROCEDURE  After the procedure, you will be moved to a recovery area until most of the effects of the medication have worn off. Your caregiver will discuss the test results with you.  Only take over-the-counter or prescription medicines for pain, discomfort, or fever as directed by your caregiver. SEEK MEDICAL CARE IF:   You have increased bleeding from your wounds.  You see redness, swelling, or have increasing pain in your wounds.  You have pus coming from your wound.  You have an oral temperature above 102 F (38.9 C).  You notice a bad smell coming from the wound or dressing.  You have severe pain with any motion of your knee. SEEK IMMEDIATE MEDICAL CARE IF:   You develop a  rash.  You have difficulty breathing.  You have any allergic problems. Document Released: 09/01/2000 Document Revised: 11/27/2011 Document Reviewed: 03/25/2008 [facility] Patient Information 2015 [place], [place]. This information is not intended to replace advice given to you by your health care provider. Make sure you discuss any questions you have with your health care provider. PATIENT  INSTRUCTIONS POST-ANESTHESIA  IMMEDIATELY FOLLOWING SURGERY:  Do not drive or operate machinery for the first twenty four hours after surgery.  Do not make any important decisions for twenty four hours after surgery or while taking narcotic pain medications or sedatives.  If you develop intractable nausea and vomiting or a severe headache please notify your doctor immediately.  FOLLOW-UP:  Please make an appointment with your surgeon as instructed. You do not need to follow up with anesthesia unless specifically instructed to do so.  WOUND CARE INSTRUCTIONS (if applicable):  Keep a dry clean dressing on the anesthesia/puncture wound site if there is drainage.  Once the wound has quit draining you may leave it open to air.  Generally you should leave the bandage intact for twenty four hours unless there is drainage.  If the epidural site drains for more than 36-48 hours please call the anesthesia department.  QUESTIONS?:  Please feel free to call your physician or the hospital operator if you have any questions, and they will be happy to assist you.

## 2015-05-28 ENCOUNTER — Telehealth: Payer: Self-pay | Admitting: Internal Medicine

## 2015-05-28 ENCOUNTER — Encounter (HOSPITAL_COMMUNITY)
Admission: RE | Admit: 2015-05-28 | Discharge: 2015-05-28 | Disposition: A | Discharge status: Home / Self Care | Payer: Medicare Other | Source: Ambulatory Visit | Attending: Orthopaedic Surgery | Admitting: Orthopaedic Surgery

## 2015-05-28 ENCOUNTER — Other Ambulatory Visit: Payer: Self-pay

## 2015-05-28 ENCOUNTER — Inpatient Hospital Stay (HOSPITAL_COMMUNITY): Admission: RE | Admit: 2015-05-28 | Payer: Medicare Other | Source: Ambulatory Visit

## 2015-05-28 ENCOUNTER — Encounter (HOSPITAL_COMMUNITY): Payer: Self-pay

## 2015-05-28 DIAGNOSIS — S83242D Other tear of medial meniscus, current injury, left knee, subsequent encounter: Secondary | ICD-10-CM | POA: Insufficient documentation

## 2015-05-28 DIAGNOSIS — Z01818 Encounter for other preprocedural examination: Secondary | ICD-10-CM | POA: Insufficient documentation

## 2015-05-28 HISTORY — DX: Cardiac arrhythmia, unspecified: I49.9

## 2015-05-28 LAB — COMPREHENSIVE METABOLIC PANEL
ALBUMIN: 3.9 g/dL (ref 3.5–5.0)
ALT: 22 U/L (ref 17–63)
AST: 25 U/L (ref 15–41)
Alkaline Phosphatase: 67 U/L (ref 38–126)
Anion gap: 7 (ref 5–15)
BILIRUBIN TOTAL: 0.4 mg/dL (ref 0.3–1.2)
BUN: 16 mg/dL (ref 6–20)
CHLORIDE: 102 mmol/L (ref 101–111)
CO2: 27 mmol/L (ref 22–32)
CREATININE: 0.97 mg/dL (ref 0.61–1.24)
Calcium: 9.1 mg/dL (ref 8.9–10.3)
GFR calc Af Amer: >60 mL/min (ref >60)
GLUCOSE: 114 mg/dL — AB (ref 65–99)
POTASSIUM: 4.3 mmol/L (ref 3.5–5.1)
Sodium: 136 mmol/L (ref 135–145)
Total Protein: 7 g/dL (ref 6.5–8.1)

## 2015-05-28 LAB — CBC WITH DIFFERENTIAL/PLATELET
BASOS ABS: 0 10*3/uL (ref 0.0–0.1)
BASOS PCT: 1 % (ref 0–1)
Eosinophils Absolute: 0.3 10*3/uL (ref 0.0–0.7)
Eosinophils Relative: 4 % (ref 0–5)
HEMATOCRIT: 38.1 % — AB (ref 39.0–52.0)
Hemoglobin: 12.8 g/dL — ABNORMAL LOW (ref 13.0–17.0)
LYMPHS PCT: 26 % (ref 12–46)
Lymphs Abs: 1.9 10*3/uL (ref 0.7–4.0)
MCH: 32.2 pg (ref 26.0–34.0)
MCHC: 33.6 g/dL (ref 30.0–36.0)
MCV: 95.7 fL (ref 78.0–100.0)
MONO ABS: 0.8 10*3/uL (ref 0.1–1.0)
Monocytes Relative: 10 % (ref 3–12)
NEUTROS ABS: 4.2 10*3/uL (ref 1.7–7.7)
Neutrophils Relative %: 59 % (ref 43–77)
PLATELETS: 188 10*3/uL (ref 150–400)
RBC: 3.98 MIL/uL — AB (ref 4.22–5.81)
RDW: 13.5 % (ref 11.5–15.5)
WBC: 7.2 10*3/uL (ref 4.0–10.5)

## 2015-05-28 LAB — URINALYSIS, ROUTINE W REFLEX MICROSCOPIC
Bilirubin Urine: NEGATIVE
Glucose, UA: NEGATIVE mg/dL
Hgb urine dipstick: NEGATIVE
KETONES UR: NEGATIVE mg/dL
LEUKOCYTES UA: NEGATIVE
NITRITE: NEGATIVE
PROTEIN: NEGATIVE mg/dL
Specific Gravity, Urine: <1.005 — ABNORMAL LOW (ref 1.005–1.030)
Urobilinogen, UA: 0.2 mg/dL (ref 0.0–1.0)
pH: 7 (ref 5.0–8.0)

## 2015-05-28 NOTE — Telephone Encounter (Signed)
Will forward note to Dr. Lovena Le

## 2015-05-28 NOTE — Telephone Encounter (Signed)
Patient to have orthroscopy of knee and is needing clearance per Olivia Mackie in [place] Stay @ [facility],.. / tg

## 2015-06-01 ENCOUNTER — Telehealth: Payer: Self-pay

## 2015-06-01 NOTE — Telephone Encounter (Signed)
[name] would be considered low risk for major cardiac complications. GT

## 2015-06-01 NOTE — Telephone Encounter (Signed)
FORWARDED TO Nelda Severe, RN

## 2015-06-02 ENCOUNTER — Encounter: Payer: Self-pay | Admitting: Internal Medicine

## 2015-06-02 NOTE — H&P (Signed)
[name]. is an 74 y.o. male.   Chief Complaint: Left knee pain HPI: He fell on July 11th and hurt his left knee. It got worse slowly over the next few weeks.  He fell again on July 25 and the knee has gotten significantly more painful, swelling and giving way.  I saw him on July 29.  I was concerned about a medial meniscus tear.  He had a CT arthrogram scan on 04/26/15 (he has pacemaker and cannot have MRI).  CT arthrogram shows tear of the medial meniscus.  I advised surgery.  He has seen his cardiologist and has been approved for surgery.  He has stopped his anticoagulation per their recommendations. I have discussed with him arthroscopy of the left knee as an outpatient.  I have gone over the risks and imponderables of the procedure.  He appears to understand.  He agrees to procedure.  He is at increased risk because of his vascular and cardiac problems.  He will resume his anticoagulation the day after surgery.  He will need physical therapy post operatively.  Past Medical History  Diagnosis Date  . General weakness     +malaise, exercise intolerance, and near syncope  . Atrial flutter     a. s/p ablation 01/2015.  Marland Kitchen Hypertension   . BPH (benign prostatic hyperplasia)   . Cerebrovascular disease     carotid bruit; no focal stenosis by carotid ultrasound in 05/2008  . Low back pain     history of traumax2  . Tachy-brady syndrome     a. s/p St. Jude pacemaker 2009.  Marland Kitchen Paroxysmal atrial fibrillation   . Sleep apnea     c pap at night  . Dysrhythmia     Past Surgical History  Procedure Laterality Date  . Cervical discectomy  2006  . Cataract extraction      Bilateral  . Electrophysiologic study N/A 01/18/2015    Procedure: A-Flutter/A-Tach/Svt Ablation;  Surgeon: [name], MD;  Location: [place] INVASIVE CV LAB CUPID;  Service: Cardiovascular;  Laterality: N/A;  . Ablation      Family History  Problem Relation Age of Onset  . Hypertension     Social History:  reports  that he quit smoking about 53 years ago. His smoking use included Cigarettes. He has a .5 pack-year smoking history. He has never used smokeless tobacco. He reports that he does not drink alcohol or use illicit drugs.  Allergies:  Allergies  Allergen Reactions  . Bee Venom Swelling  . Penicillins     REACTION: rash  . Shellfish Allergy Hives    Accelerated allergic reaction; questionable laryngospasm or laryngeal edema with some difficulty breathing prompting ED evaluation and treatment at [facility]    No prescriptions prior to admission    No results found for this or any previous visit (from the past 48 hour(s)). No results found.  Review of Systems  Respiratory:       Sleep apnea  Cardiovascular:       History of hyperlipidemia, chest pain, sick sinus syndrome, PAT and atrial flutter and atrial fibrillation.  He is on anticoagulation.  Genitourinary:       History of BPH  Musculoskeletal: Positive for back pain and joint pain (Left knee pain.).  Neurological:       History of CVA with no residual    There were no vitals taken for this visit. Physical Exam  Constitutional: He is oriented to person, place, and time. He appears  well-developed and well-nourished.  HENT:  Head: Normocephalic and atraumatic.  Eyes: Conjunctivae and EOM are normal. Pupils are equal, round, and reactive to light.  Neck: Normal range of motion. Neck supple.  Cardiovascular: Normal heart sounds and intact distal pulses.   Irregular rhythm   Respiratory: Effort normal and breath sounds normal.  GI: Soft.  Musculoskeletal: He exhibits tenderness (Pain left knee, ROM 0 to 100 with medial joint line positive McMurray, mild effusion, crepitus.  Stable.).  Neurological: He is alert and oriented to person, place, and time. He has normal reflexes.  Skin: Skin is warm and dry.  Psychiatric: He has a normal mood and affect. His behavior is normal. Judgment and thought content normal.      Assessment/Plan Tear of the medial meniscus of the left knee.  For outpatient arthroscopy. History of heart disease with sick sinus syndrome, placement of pacemaker, Atrial fib, PAT. History of sleep apnea History of CVA, no residual Normally on anticoagulation, has stopped for surgery.   Tracy Gerken 06/02/2015, 4:14 PM

## 2015-06-03 ENCOUNTER — Ambulatory Visit (HOSPITAL_COMMUNITY): Payer: Medicare Other | Admitting: Anesthesiology

## 2015-06-03 ENCOUNTER — Encounter (HOSPITAL_COMMUNITY): Payer: Self-pay | Admitting: *Deleted

## 2015-06-03 ENCOUNTER — Ambulatory Visit (HOSPITAL_COMMUNITY)
Admission: RE | Admit: 2015-06-03 | Discharge: 2015-06-03 | Disposition: A | Discharge status: Home Health | Payer: Medicare Other | Source: Ambulatory Visit | Attending: Orthopaedic Surgery | Admitting: Orthopaedic Surgery

## 2015-06-03 ENCOUNTER — Encounter (HOSPITAL_COMMUNITY)
Admission: RE | Disposition: A | Discharge status: Home Health | Payer: Self-pay | Source: Ambulatory Visit | Attending: Orthopaedic Surgery

## 2015-06-03 DIAGNOSIS — Z87891 Personal history of nicotine dependence: Secondary | ICD-10-CM | POA: Insufficient documentation

## 2015-06-03 DIAGNOSIS — Z79899 Other long term (current) drug therapy: Secondary | ICD-10-CM | POA: Diagnosis not present

## 2015-06-03 DIAGNOSIS — S83242A Other tear of medial meniscus, current injury, left knee, initial encounter: Secondary | ICD-10-CM | POA: Insufficient documentation

## 2015-06-03 DIAGNOSIS — Z7901 Long term (current) use of anticoagulants: Secondary | ICD-10-CM | POA: Insufficient documentation

## 2015-06-03 DIAGNOSIS — I10 Essential (primary) hypertension: Secondary | ICD-10-CM | POA: Insufficient documentation

## 2015-06-03 DIAGNOSIS — K219 Gastro-esophageal reflux disease without esophagitis: Secondary | ICD-10-CM | POA: Diagnosis not present

## 2015-06-03 DIAGNOSIS — G4730 Sleep apnea, unspecified: Secondary | ICD-10-CM | POA: Diagnosis not present

## 2015-06-03 DIAGNOSIS — I4891 Unspecified atrial fibrillation: Secondary | ICD-10-CM | POA: Insufficient documentation

## 2015-06-03 DIAGNOSIS — W19XXXA Unspecified fall, initial encounter: Secondary | ICD-10-CM | POA: Diagnosis not present

## 2015-06-03 DIAGNOSIS — S83207A Unspecified tear of unspecified meniscus, current injury, left knee, initial encounter: Secondary | ICD-10-CM | POA: Diagnosis not present

## 2015-06-03 HISTORY — PX: KNEE ARTHROSCOPY WITH MEDIAL MENISECTOMY: SHX5651

## 2015-06-03 SURGERY — ARTHROSCOPY, KNEE, WITH MEDIAL MENISCECTOMY
Anesthesia: General | Site: Knee | Laterality: Left

## 2015-06-03 MED ORDER — BUPIVACAINE HCL (PF) 0.25 % IJ SOLN
INTRAMUSCULAR | Status: DC | PRN
  Administered 2015-06-03: 30 mL

## 2015-06-03 MED ORDER — LIDOCAINE HCL (CARDIAC) 10 MG/ML IV SOLN
INTRAVENOUS | Status: DC | PRN
  Administered 2015-06-03: 50 mg via INTRAVENOUS

## 2015-06-03 MED ORDER — FENTANYL CITRATE (PF) 100 MCG/2ML IJ SOLN
INTRAMUSCULAR | Status: AC
  Filled 2015-06-03: qty 4

## 2015-06-03 MED ORDER — PROPOFOL 10 MG/ML IV BOLUS
INTRAVENOUS | Status: DC | PRN
  Administered 2015-06-03: 140 mg via INTRAVENOUS

## 2015-06-03 MED ORDER — MIDAZOLAM HCL 2 MG/2ML IJ SOLN
1.0000 mg | INTRAMUSCULAR | Status: DC | PRN
  Administered 2015-06-03: 2 mg via INTRAVENOUS

## 2015-06-03 MED ORDER — FENTANYL CITRATE (PF) 100 MCG/2ML IJ SOLN
INTRAMUSCULAR | Status: AC
  Filled 2015-06-03: qty 2

## 2015-06-03 MED ORDER — 0.9 % SODIUM CHLORIDE (POUR BTL) OPTIME
TOPICAL | Status: DC | PRN
  Administered 2015-06-03: 1000 mL

## 2015-06-03 MED ORDER — ONDANSETRON HCL 4 MG/2ML IJ SOLN
INTRAMUSCULAR | Status: DC | PRN
  Administered 2015-06-03: 4 mg via INTRAVENOUS

## 2015-06-03 MED ORDER — FENTANYL CITRATE (PF) 100 MCG/2ML IJ SOLN
25.0000 ug | INTRAMUSCULAR | Status: AC
  Administered 2015-06-03 (×2): 25 ug via INTRAVENOUS

## 2015-06-03 MED ORDER — LACTATED RINGERS IR SOLN
Status: DC | PRN
  Administered 2015-06-03 (×3): 3000 mL

## 2015-06-03 MED ORDER — MIDAZOLAM HCL 2 MG/2ML IJ SOLN
INTRAMUSCULAR | Status: AC
  Filled 2015-06-03: qty 2

## 2015-06-03 MED ORDER — FENTANYL CITRATE (PF) 100 MCG/2ML IJ SOLN
INTRAMUSCULAR | Status: DC | PRN
  Administered 2015-06-03 (×2): 25 ug via INTRAVENOUS

## 2015-06-03 MED ORDER — ONDANSETRON HCL 4 MG/2ML IJ SOLN: 4.0000 mg | Freq: Once | INTRAMUSCULAR | Status: DC | PRN

## 2015-06-03 MED ORDER — PHENYLEPHRINE HCL 10 MG/ML IJ SOLN
INTRAMUSCULAR | Status: DC | PRN
  Administered 2015-06-03 (×2): 40 ug via INTRAVENOUS
  Administered 2015-06-03 (×2): 80 ug via INTRAVENOUS
  Administered 2015-06-03: 40 ug via INTRAVENOUS

## 2015-06-03 MED ORDER — FENTANYL CITRATE (PF) 100 MCG/2ML IJ SOLN
25.0000 ug | INTRAMUSCULAR | Status: DC | PRN
  Administered 2015-06-03 (×2): 50 ug via INTRAVENOUS

## 2015-06-03 MED ORDER — BUPIVACAINE HCL (PF) 0.25 % IJ SOLN
INTRAMUSCULAR | Status: AC
  Filled 2015-06-03: qty 30

## 2015-06-03 MED ORDER — CHLORHEXIDINE GLUCONATE 4 % EX LIQD: 60.0000 mL | Freq: Once | CUTANEOUS | Status: DC

## 2015-06-03 MED ORDER — ONDANSETRON HCL 4 MG/2ML IJ SOLN
4.0000 mg | Freq: Once | INTRAMUSCULAR | Status: AC
  Administered 2015-06-03: 4 mg via INTRAVENOUS

## 2015-06-03 MED ORDER — ONDANSETRON HCL 4 MG/2ML IJ SOLN
INTRAMUSCULAR | Status: AC
  Filled 2015-06-03: qty 2

## 2015-06-03 MED ORDER — PROPOFOL 10 MG/ML IV BOLUS
INTRAVENOUS | Status: AC
  Filled 2015-06-03: qty 20

## 2015-06-03 MED ORDER — LACTATED RINGERS IV SOLN
INTRAVENOUS | Status: DC
  Administered 2015-06-03: 07:00:00 via INTRAVENOUS
  Administered 2015-06-03: 1000 mL via INTRAVENOUS

## 2015-06-03 SURGICAL SUPPLY — 52 items
BAG HAMPER (MISCELLANEOUS) ×4 IMPLANT
BANDAGE ELASTIC 6 VELCRO NS (GAUZE/BANDAGES/DRESSINGS) ×4 IMPLANT
BANDAGE ESMARK 4X12 BL STRL LF (DISPOSABLE) ×2 IMPLANT
BLADE SURG 15 STRL LF DISP TIS (BLADE) ×2 IMPLANT
BLADE SURG 15 STRL SS (BLADE) ×4
BLADE SURG SZ11 CARB STEEL (BLADE) ×4 IMPLANT
BNDG CMPR 12X4 ELC STRL LF (DISPOSABLE) ×2
BNDG ESMARK 4X12 BLUE STRL LF (DISPOSABLE) ×4
CHLORAPREP W/TINT 26ML (MISCELLANEOUS) ×3 IMPLANT
CLOTH BEACON ORANGE TIMEOUT ST (SAFETY) ×4 IMPLANT
CUFF TOURNIQUET SINGLE 34IN LL (TOURNIQUET CUFF) ×3 IMPLANT
CUTTER TOMCAT 4.0M (BURR) ×4 IMPLANT
DECANTER SPIKE VIAL GLASS SM (MISCELLANEOUS) ×4 IMPLANT
DRAPE PROXIMA HALF (DRAPES) ×4 IMPLANT
DRSG XEROFORM 1X8 (GAUZE/BANDAGES/DRESSINGS) ×7 IMPLANT
ELECT MENISCUS 165MM 90D (ELECTRODE) ×3 IMPLANT
FORMALIN 10 PREFIL 480ML (MISCELLANEOUS) ×4 IMPLANT
GAUZE SPONGE 4X4 12PLY STRL (GAUZE/BANDAGES/DRESSINGS) ×4 IMPLANT
GAUZE SPONGE 4X4 16PLY XRAY LF (GAUZE/BANDAGES/DRESSINGS) ×4 IMPLANT
GLOVE BIO SURGEON STRL SZ7 (GLOVE) ×3 IMPLANT
GLOVE BIO SURGEON STRL SZ8 (GLOVE) ×4 IMPLANT
GLOVE BIO SURGEON STRL SZ8.5 (GLOVE) ×4 IMPLANT
GLOVE BIOGEL PI IND STRL 7.0 (GLOVE) ×1 IMPLANT
GLOVE BIOGEL PI INDICATOR 7.0 (GLOVE) ×2
GOWN STRL REUS W/TWL LRG LVL3 (GOWN DISPOSABLE) ×4 IMPLANT
GOWN STRL REUS W/TWL XL LVL3 (GOWN DISPOSABLE) ×4 IMPLANT
HLDR LEG FOAM (MISCELLANEOUS) ×2 IMPLANT
IV LACTATED RINGER IRRG 3000ML (IV SOLUTION) ×12
IV LR IRRIG 3000ML ARTHROMATIC (IV SOLUTION) ×5 IMPLANT
KIT BLADEGUARD II DBL (SET/KITS/TRAYS/PACK) ×4 IMPLANT
KIT ROOM TURNOVER AP CYSTO (KITS) ×4 IMPLANT
LEG HOLDER FOAM (MISCELLANEOUS) ×2
MANIFOLD NEPTUNE II (INSTRUMENTS) ×4 IMPLANT
MARKER SKIN DUAL TIP RULER LAB (MISCELLANEOUS) ×4 IMPLANT
NDL HYPO 21X1.5 SAFETY (NEEDLE) ×1 IMPLANT
NDL SPNL 18GX3.5 QUINCKE PK (NEEDLE) ×1 IMPLANT
NEEDLE HYPO 21X1.5 SAFETY (NEEDLE) ×4 IMPLANT
NEEDLE SPNL 18GX3.5 QUINCKE PK (NEEDLE) ×4 IMPLANT
NS IRRIG 1000ML POUR BTL (IV SOLUTION) ×4 IMPLANT
PACK ARTHRO LIMB DRAPE STRL (MISCELLANEOUS) ×4 IMPLANT
PAD ABD 5X9 TENDERSORB (GAUZE/BANDAGES/DRESSINGS) ×4 IMPLANT
PAD ABD 8X10 STRL (GAUZE/BANDAGES/DRESSINGS) ×3 IMPLANT
PAD ARMBOARD 7.5X6 YLW CONV (MISCELLANEOUS) ×4 IMPLANT
PADDING CAST COTTON 6X4 STRL (CAST SUPPLIES) ×4 IMPLANT
PADDING WEBRIL 6 STERILE (GAUZE/BANDAGES/DRESSINGS) ×3 IMPLANT
SET ARTHROSCOPY INST (INSTRUMENTS) ×4 IMPLANT
SET BASIN LINEN APH (SET/KITS/TRAYS/PACK) ×4 IMPLANT
SPONGE GAUZE 4X4 12PLY STER LF (GAUZE/BANDAGES/DRESSINGS) ×3 IMPLANT
SUT ETHILON 3 0 FSL (SUTURE) ×4 IMPLANT
SYR 30ML LL (SYRINGE) ×4 IMPLANT
TUBING FLOSTEADY ARTHRO PUMP (IRRIGATION / IRRIGATOR) ×4 IMPLANT
YANKAUER SUCT BULB TIP 10FT TU (MISCELLANEOUS) ×8 IMPLANT

## 2015-06-03 NOTE — Anesthesia Postprocedure Evaluation (Signed)
  Anesthesia Post-op Note  Patient: [name].  Procedure(s) Performed: Procedure(s): KNEE ARTHROSCOPY WITH MEDIAL MENISECTOMY (Left)  Patient Location: PACU  Anesthesia Type:General  Level of Consciousness: awake, alert , oriented, patient cooperative and responds to stimulation  Airway and Oxygen Therapy: Patient Spontanous Breathing and Patient connected to face mask oxygen  Post-op Pain: none  Post-op Assessment: Post-op Vital signs reviewed, Patient's Cardiovascular Status Stable, Respiratory Function Stable, Patent Airway, No signs of Nausea or vomiting and Pain level controlled              Post-op Vital Signs: Reviewed and stable  Last Vitals:  Filed Vitals:   06/03/15 0715  BP: 130/75  Pulse:   Temp:   Resp: 15    Complications: No apparent anesthesia complications

## 2015-06-03 NOTE — Brief Op Note (Signed)
06/03/2015  8:15 AM  PATIENT:  [name].  74 y.o. male  PRE-OPERATIVE DIAGNOSIS:  tear left medial meniscus  POST-OPERATIVE DIAGNOSIS:  medial meniscal tear  PROCEDURE:  Procedure(s): KNEE ARTHROSCOPY WITH MEDIAL MENISECTOMY (Left)  SURGEON:  Surgeon(s) and Role:    * [name], MD - Primary  PHYSICIAN ASSISTANT:   ASSISTANTS: none   ANESTHESIA:   general  EBL:     BLOOD ADMINISTERED:none  DRAINS: none   LOCAL MEDICATIONS USED:  MARCAINE     SPECIMEN:  Source of Specimen:  left knee medial meniscus  DISPOSITION OF SPECIMEN:  PATHOLOGY  COUNTS:  YES  TOURNIQUET:   Total Tourniquet Time Documented: Thigh (Left) - 16 minutes Total: Thigh (Left) - 16 minutes   DICTATION: .Other Dictation: Dictation Number [id number]  PLAN OF CARE: Discharge to home after PACU  PATIENT DISPOSITION:  PACU - hemodynamically stable.   Delay start of Pharmacological VTE agent (>24hrs) due to surgical blood loss or risk of bleeding: not applicable

## 2015-06-03 NOTE — Anesthesia Procedure Notes (Addendum)
Procedure Name: LMA Insertion Date/Time: 06/03/2015 7:39 AM Performed by: Gershon Mussel, Emillio Ngo Pre-anesthesia Checklist: Patient identified, Patient being monitored, Timeout performed, Emergency Drugs available and Suction available Patient Re-evaluated:Patient Re-evaluated prior to inductionOxygen Delivery Method: Circle System Utilized Preoxygenation: Pre-oxygenation with 100% oxygen Intubation Type: IV induction Ventilation: Mask ventilation without difficulty LMA Size: 4.0 Tube type: Oral Number of attempts: 1 Placement Confirmation: positive ETCO2 and breath sounds checked- equal and bilateral Tube secured with: Tape Dental Injury: Teeth and Oropharynx as per pre-operative assessment

## 2015-06-03 NOTE — Op Note (Signed)
NAME:  [name], [name]             ACCOUNT NO.:  [id number]  MEDICAL RECORD NO.:  [id number]  LOCATION:  [facility]                          FACILITY:  [facility]  PHYSICIAN:  J. [name], M.D. DATE OF BIRTH:  03/21/1941  DATE OF PROCEDURE: DATE OF DISCHARGE:                              OPERATIVE REPORT   PREOPERATIVE DIAGNOSIS:  Tear of medial meniscus of the left knee.  POSTOPERATIVE DIAGNOSIS:  Tear of medial meniscus of the left knee.  PROCEDURE:  Operative arthroscopy of the left knee, partial medial meniscectomy.  ANESTHESIA:  General.  TOURNIQUET TIME:  Sixteen minutes.  SURGEON:  J. [name], MD  INDICATIONS:  Patient fell and injured his knee in early July and he fell again toward the end of July and had more pain and swelling of the knee with locking and giving way.  Saw him in my office a few days later.  We arranged a CT arthrogram of the knee because he could not have an MRI due to pacemaker.  The CT arthrogram showed a tear of the medial meniscus of the knee.  Surgery was recommended.  Patient has significant medical problems and is on an anticoagulation medicine.  He was seen by Cardiology and high risk for the procedure was approved for the surgery.  He has now been off the anticoagulation medicine per Cardiology recommendations.  I discussed the risks and imponderables of the procedure and the patient appears to understand the procedure as outlined.  This is an elective procedure as an outpatient procedure.  He knows he will need physical therapy after surgery.  DESCRIPTION OF PROCEDURE:  Patient was seen in the holding area.  The left knee was marked as the correct surgical site.  Patient was brought to the operating room, placed supine on the operating room table.  He was given general anesthesia.  Tourniquet leg holder placed, deflated left upper thigh.  The patient was prepped and draped in usual manner.  Had a generalized time-out identifying the  patient as [name] and doing his left knee for arthroscopy.  All instrumentation was properly positioned and working.  The OR team knew each other.  The leg was wrapped circumferentially with an Esmarch bandage. Tourniquet was inflated to 300 mmHg.  Esmarch bandage was removed. Inflow cannula inserted medially and lactated Ringer's instilled into the knee by an infusion pump.  Arthroscope inserted laterally. Permanent pictures were taken throughout the procedure.  He had a tear of the medial meniscus with grade 3 changes of the articular surfaces. Anterior cruciate was intact laterally.  He had grade 2 changes with no tear.  Attention directed back to the medial side and using a meniscal shaver, meniscal punch, and probed.  The tear of the meniscus was removed.  It was in the posterior horn area and posterior part of the knee, good smooth contour was obtained.  Knee was systematically reexamined and no pathology found.  Wound was irrigated with remaining part of lactated Ringer's.  Wound was then reapproximated using 3-0 nylon interrupted vertical mattress manner.  Marcaine 0.25% instilled in each portal.  Tourniquet deflated at 16 minutes.  Sterile dressing applied.  Bulky dressing applied.  Patient to go  to recovery in good condition.  He will resume his anticoagulation medicine tomorrow per Cardiology recommendations.  If there is any difficulties contact me through the office hospital beeper system.          ______________________________ Lenna Sciara. [name], M.D.     JWK/MEDQ  D:  06/03/2015  T:  06/03/2015  Job:  [id number]

## 2015-06-03 NOTE — Transfer of Care (Signed)
Immediate Anesthesia Transfer of Care Note  Patient: [name].  Procedure(s) Performed: Procedure(s): KNEE ARTHROSCOPY WITH MEDIAL MENISECTOMY (Left)  Patient Location: PACU  Anesthesia Type:General  Level of Consciousness: awake, alert , oriented, patient cooperative and responds to stimulation  Airway & Oxygen Therapy: Patient Spontanous Breathing and Patient connected to face mask oxygen  Post-op Assessment: Report given to RN, Post -op Vital signs reviewed and stable and Patient moving all extremities  Post vital signs: Reviewed and stable  Last Vitals:  Filed Vitals:   06/03/15 0715  BP: 130/75  Pulse:   Temp:   Resp: 15    Complications: No apparent anesthesia complications

## 2015-06-03 NOTE — Progress Notes (Signed)
The History and Physical is unchanged. I have examined the patient. The patient is medically able to have surgery on the left knee . Jaston Havens  

## 2015-06-03 NOTE — Anesthesia Preprocedure Evaluation (Signed)
Anesthesia Evaluation  Patient identified by MRN, date of birth, ID band Patient awake    Reviewed: Allergy & Precautions, NPO status , Patient's Chart, lab work & pertinent test results  Airway Mallampati: II  TM Distance: >3 FB     Dental  (+) Teeth Intact, Dental Advisory Given   Pulmonary shortness of breath and with exertion, sleep apnea and Continuous Positive Airway Pressure Ventilation , former smoker,    breath sounds clear to auscultation       Cardiovascular hypertension, Pt. on medications + dysrhythmias Atrial Fibrillation + pacemaker  Rhythm:Regular Rate:Normal     Neuro/Psych    GI/Hepatic GERD  Controlled and Medicated,  Endo/Other  Morbid obesity  Renal/GU      Musculoskeletal   Abdominal   Peds  Hematology   Anesthesia Other Findings   Reproductive/Obstetrics                             Anesthesia Physical Anesthesia Plan  ASA: III  Anesthesia Plan: General   Post-op Pain Management:    Induction: Intravenous  Airway Management Planned: LMA  Additional Equipment:   Intra-op Plan:   Post-operative Plan:   Informed Consent: I have reviewed the patients History and Physical, chart, labs and discussed the procedure including the risks, benefits and alternatives for the proposed anesthesia with the patient or authorized representative who has indicated his/her understanding and acceptance.     Plan Discussed with:   Anesthesia Plan Comments:         Anesthesia Quick Evaluation

## 2015-06-03 NOTE — Discharge Instructions (Signed)
Use crutches/walker as needed.  You may bear weight on the left knee as tolerated.  You may remove dressing from the left knee and cleanse with peroxide.  Otherwise, keep dry.  You may apply Band-aids to the stitches.  Keep physical therapy appointments.  See Dr. Luna Glasgow in about ten days at his office.  Call his office at [phone] if any problem, or if after hours, the hospital at [phone].  Arthroscopic Procedure, Knee, Care After Refer to this sheet in the next few weeks. These discharge instructions provide you with general information on caring for yourself after you leave the hospital. Your health care provider may also give you specific instructions. Your treatment has been planned according to the most current medical practices available, but unavoidable complications sometimes occur. If you have any problems or questions after discharge, please call your health care provider. HOME CARE INSTRUCTIONS   It is normal to be sore for a couple days after surgery. See your health care provider if this seems to be getting worse rather than better.  Only take over-the-counter or prescription medicines for pain, discomfort, or fever as directed by your health care provider.  Take showers rather than baths, or as directed by your health care provider.  Change bandages (dressings) if necessary or as directed.  You may resume normal diet and activities as directed or allowed.  Avoid lifting and driving until you are directed otherwise.  Make an appointment to see your health care provider for stitches (suture) or staple removal as directed.  You may put ice on the area.  Put the ice in a plastic bag. Place a towel between your skin and the bag.  Leave the ice on for 15-20 minutes, three to four times per day for the first 2 days.  Elevate the knee above the level of your heart to reduce swelling, and avoid dangling the leg.  Do 10-15 ankle pumps (pointing your toes toward you and  then away from you) two to three times daily.  If you are given compression stockings to wear after surgery, use them for as long as your surgeon tells you (around 10-14 days).  Avoid smoking and exposure to secondhand smoke. SEEK MEDICAL CARE IF:   You have increased bleeding from your wounds.  You see redness or swelling or you have increasing pain in your wounds.  You have pus coming from your wound.  You have a fever or persistent symptoms for more than 2-3 days.  You notice a bad smell coming from the wound or dressing.  You have severe pain with any motion of your knee. SEEK IMMEDIATE MEDICAL CARE IF:   You develop a rash.  You have difficulty breathing.  You develop any reaction or side effects to medicines taken.  You develop pain in the calves or back of the knee.  You develop chest pain, shortness of breath, or difficulty breathing.  You develop numbness or tingling in the leg or foot. MAKE SURE YOU:   Understand these instructions.  Will watch your condition.  Will get help right away if you are not doing well or you get worse.

## 2015-06-04 ENCOUNTER — Encounter (HOSPITAL_COMMUNITY): Payer: Self-pay | Admitting: Orthopaedic Surgery

## 2015-06-04 NOTE — Addendum Note (Signed)
Addendum  created 06/04/15 1539 by Mickel Baas, CRNA   Modules edited: Charges VN

## 2015-06-07 DIAGNOSIS — M25662 Stiffness of left knee, not elsewhere classified: Secondary | ICD-10-CM | POA: Diagnosis not present

## 2015-06-07 DIAGNOSIS — M25562 Pain in left knee: Secondary | ICD-10-CM | POA: Diagnosis not present

## 2015-06-07 DIAGNOSIS — M6281 Muscle weakness (generalized): Secondary | ICD-10-CM | POA: Diagnosis not present

## 2015-06-07 DIAGNOSIS — M25462 Effusion, left knee: Secondary | ICD-10-CM | POA: Diagnosis not present

## 2015-06-09 DIAGNOSIS — M25662 Stiffness of left knee, not elsewhere classified: Secondary | ICD-10-CM | POA: Diagnosis not present

## 2015-06-09 DIAGNOSIS — M6281 Muscle weakness (generalized): Secondary | ICD-10-CM | POA: Diagnosis not present

## 2015-06-09 DIAGNOSIS — M25562 Pain in left knee: Secondary | ICD-10-CM | POA: Diagnosis not present

## 2015-06-09 DIAGNOSIS — M25462 Effusion, left knee: Secondary | ICD-10-CM | POA: Diagnosis not present

## 2015-06-14 DIAGNOSIS — M25462 Effusion, left knee: Secondary | ICD-10-CM | POA: Diagnosis not present

## 2015-06-14 DIAGNOSIS — M25662 Stiffness of left knee, not elsewhere classified: Secondary | ICD-10-CM | POA: Diagnosis not present

## 2015-06-14 DIAGNOSIS — M25562 Pain in left knee: Secondary | ICD-10-CM | POA: Diagnosis not present

## 2015-06-14 DIAGNOSIS — M6281 Muscle weakness (generalized): Secondary | ICD-10-CM | POA: Diagnosis not present

## 2015-06-15 ENCOUNTER — Ambulatory Visit (INDEPENDENT_AMBULATORY_CARE_PROVIDER_SITE_OTHER): Payer: Medicare Other | Admitting: Urology

## 2015-06-15 DIAGNOSIS — N3281 Overactive bladder: Secondary | ICD-10-CM

## 2015-06-15 DIAGNOSIS — R351 Nocturia: Secondary | ICD-10-CM | POA: Diagnosis not present

## 2015-06-15 DIAGNOSIS — N401 Enlarged prostate with lower urinary tract symptoms: Secondary | ICD-10-CM | POA: Diagnosis not present

## 2015-06-24 DIAGNOSIS — M25562 Pain in left knee: Secondary | ICD-10-CM | POA: Diagnosis not present

## 2015-06-24 DIAGNOSIS — M25462 Effusion, left knee: Secondary | ICD-10-CM | POA: Diagnosis not present

## 2015-06-24 DIAGNOSIS — M6281 Muscle weakness (generalized): Secondary | ICD-10-CM | POA: Diagnosis not present

## 2015-06-24 DIAGNOSIS — M25662 Stiffness of left knee, not elsewhere classified: Secondary | ICD-10-CM | POA: Diagnosis not present

## 2015-06-25 DIAGNOSIS — M25662 Stiffness of left knee, not elsewhere classified: Secondary | ICD-10-CM | POA: Diagnosis not present

## 2015-06-25 DIAGNOSIS — M25462 Effusion, left knee: Secondary | ICD-10-CM | POA: Diagnosis not present

## 2015-06-25 DIAGNOSIS — M6281 Muscle weakness (generalized): Secondary | ICD-10-CM | POA: Diagnosis not present

## 2015-06-25 DIAGNOSIS — M25562 Pain in left knee: Secondary | ICD-10-CM | POA: Diagnosis not present

## 2015-07-14 DIAGNOSIS — I4892 Unspecified atrial flutter: Secondary | ICD-10-CM | POA: Diagnosis not present

## 2015-07-14 DIAGNOSIS — I10 Essential (primary) hypertension: Secondary | ICD-10-CM | POA: Diagnosis not present

## 2015-07-14 DIAGNOSIS — Z6837 Body mass index (BMI) 37.0-37.9, adult: Secondary | ICD-10-CM | POA: Diagnosis not present

## 2015-07-17 DIAGNOSIS — R32 Unspecified urinary incontinence: Secondary | ICD-10-CM | POA: Diagnosis not present

## 2015-07-17 DIAGNOSIS — M810 Age-related osteoporosis without current pathological fracture: Secondary | ICD-10-CM | POA: Diagnosis not present

## 2015-07-17 DIAGNOSIS — E049 Nontoxic goiter, unspecified: Secondary | ICD-10-CM | POA: Diagnosis not present

## 2015-07-17 DIAGNOSIS — I10 Essential (primary) hypertension: Secondary | ICD-10-CM | POA: Diagnosis not present

## 2015-07-20 ENCOUNTER — Telehealth: Payer: Self-pay | Admitting: Internal Medicine

## 2015-07-20 NOTE — Telephone Encounter (Signed)
New Message  Rep from [facility] calling to see if Dr Lovena Le had recevied a Physicians query- clarification for documentation concerning this pt. Please call back and discuss.

## 2015-07-21 NOTE — Telephone Encounter (Signed)
Form being faxed by Maudie Mercury in MR this morning

## 2015-09-03 ENCOUNTER — Ambulatory Visit: Payer: Medicare Other | Admitting: Pulmonary Disease

## 2015-09-07 DIAGNOSIS — L309 Dermatitis, unspecified: Secondary | ICD-10-CM | POA: Diagnosis not present

## 2015-09-07 DIAGNOSIS — I10 Essential (primary) hypertension: Secondary | ICD-10-CM | POA: Diagnosis not present

## 2015-09-30 DIAGNOSIS — S83242D Other tear of medial meniscus, current injury, left knee, subsequent encounter: Secondary | ICD-10-CM | POA: Diagnosis not present

## 2015-09-30 DIAGNOSIS — Z8719 Personal history of other diseases of the digestive system: Secondary | ICD-10-CM | POA: Diagnosis not present

## 2015-09-30 DIAGNOSIS — I10 Essential (primary) hypertension: Secondary | ICD-10-CM | POA: Diagnosis not present

## 2015-10-05 DIAGNOSIS — Z961 Presence of intraocular lens: Secondary | ICD-10-CM | POA: Diagnosis not present

## 2015-10-05 DIAGNOSIS — H52223 Regular astigmatism, bilateral: Secondary | ICD-10-CM | POA: Diagnosis not present

## 2015-10-05 DIAGNOSIS — H5203 Hypermetropia, bilateral: Secondary | ICD-10-CM | POA: Diagnosis not present

## 2015-10-05 DIAGNOSIS — H524 Presbyopia: Secondary | ICD-10-CM | POA: Diagnosis not present

## 2015-10-31 ENCOUNTER — Other Ambulatory Visit: Payer: Self-pay | Admitting: Internal Medicine

## 2015-11-02 DIAGNOSIS — J309 Allergic rhinitis, unspecified: Secondary | ICD-10-CM | POA: Diagnosis not present

## 2015-11-02 DIAGNOSIS — I10 Essential (primary) hypertension: Secondary | ICD-10-CM | POA: Diagnosis not present

## 2015-11-12 ENCOUNTER — Telehealth: Payer: Self-pay | Admitting: Internal Medicine

## 2015-11-12 NOTE — Telephone Encounter (Signed)
Patient states that Dr.Fagan (PCP) advised he stop Metoprolol. Wanted to make sure this is okay with Dr.Taylor. / tg

## 2015-11-17 NOTE — Telephone Encounter (Signed)
Patient updated on status. Pt to come in this week and fill out paper work on [employer] assistance.

## 2015-11-17 NOTE — Telephone Encounter (Signed)
Correction to previous note. Pt is on Xarelto 20 mg not Eliquis.

## 2015-11-18 ENCOUNTER — Encounter: Payer: Self-pay | Admitting: *Deleted

## 2015-11-18 ENCOUNTER — Encounter: Payer: Self-pay | Admitting: Cardiology

## 2015-11-18 ENCOUNTER — Ambulatory Visit (INDEPENDENT_AMBULATORY_CARE_PROVIDER_SITE_OTHER): Payer: Medicare Other | Admitting: Cardiology

## 2015-11-18 VITALS — BP 128/72 | HR 83 | Ht 68.0 in | Wt 243.0 lb

## 2015-11-18 DIAGNOSIS — Z950 Presence of cardiac pacemaker: Secondary | ICD-10-CM

## 2015-11-18 DIAGNOSIS — I10 Essential (primary) hypertension: Secondary | ICD-10-CM | POA: Diagnosis not present

## 2015-11-18 DIAGNOSIS — R5383 Other fatigue: Secondary | ICD-10-CM | POA: Insufficient documentation

## 2015-11-18 DIAGNOSIS — Z7901 Long term (current) use of anticoagulants: Secondary | ICD-10-CM

## 2015-11-18 DIAGNOSIS — G4733 Obstructive sleep apnea (adult) (pediatric): Secondary | ICD-10-CM | POA: Diagnosis not present

## 2015-11-18 DIAGNOSIS — I4891 Unspecified atrial fibrillation: Secondary | ICD-10-CM

## 2015-11-18 DIAGNOSIS — I495 Sick sinus syndrome: Secondary | ICD-10-CM | POA: Diagnosis not present

## 2015-11-18 MED ORDER — FLECAINIDE ACETATE 100 MG PO TABS: 100.0000 mg | ORAL_TABLET | Freq: Two times a day (BID) | ORAL | Status: DC

## 2015-11-18 MED ORDER — RIVAROXABAN 20 MG PO TABS: 20.0000 mg | ORAL_TABLET | Freq: Every day | ORAL | Status: DC

## 2015-11-18 NOTE — Assessment & Plan Note (Signed)
St Jude- 2009

## 2015-11-18 NOTE — Assessment & Plan Note (Signed)
Controlled.  

## 2015-11-18 NOTE — Assessment & Plan Note (Signed)
Paroxysmal atrial fibrillation; bradycardia requiring permanent pacing

## 2015-11-18 NOTE — Assessment & Plan Note (Signed)
And atrial flutter- s/p RFA July 2016, Flecainide started after for recurrent AF

## 2015-11-18 NOTE — Progress Notes (Signed)
11/18/2015 [name].   September 25, 1940  [id number]  Primary Physician [name], MD Primary Cardiologist: Dr Lovena Le  HPI:  75 y/o male with a history of PAF and SSS. He has had prior RFA-July2016. He apparently went back into AF shortly afterwards and was placed on Flecainide with return to NSR. He is in the office today because he feels he is back in AF. He is fatigued and dizzy. He says he felt great for a time after hiss cardioversion but has had progressive symptoms over the past weeks. His EKG in the office today is V paced at 75, I can't see clear P waves.    Current Outpatient Prescriptions  Medication Sig Dispense Refill  . Calcium Carbonate-Vitamin D (CALCIUM-VITAMIN D3) 600-125 MG-UNIT TABS Take 1,200 mg by mouth 2 (two) times daily.     . DESMOPRESSIN ACETATE PO Take 0.4 mg by mouth daily.    Marland Kitchen diltiazem (TIAZAC) 120 MG 24 hr capsule Take 120 mg by mouth every evening.    . diltiazem (TIAZAC) 300 MG 24 hr capsule Take 300 mg by mouth every morning.    . docusate sodium (COLACE) 100 MG capsule Take 100 mg by mouth 2 (two) times daily as needed for mild constipation.    . flecainide (TAMBOCOR) 100 MG tablet Take 1 tablet (100 mg total) by mouth 2 (two) times daily. 180 tablet 3  . gabapentin (NEURONTIN) 800 MG tablet Take 800 mg by mouth 2 (two) times daily.    . hydrocortisone (ANUSOL-HC) 2.5 % rectal cream Place 1 application rectally 2 (two) times daily as needed for hemorrhoids or itching.    . metoprolol tartrate (LOPRESSOR) 25 MG tablet Take 0.5 tablets (12.5 mg total) by mouth 2 (two) times daily. 90 tablet 3  . morphine (MS CONTIN) 15 MG 12 hr tablet Take 15 mg by mouth every 12 (twelve) hours.    . pantoprazole (PROTONIX) 40 MG tablet Take 40 mg by mouth daily.    . polyethylene glycol (MIRALAX / GLYCOLAX) packet Take 17 g by mouth at bedtime.     . psyllium (METAMUCIL) 58.6 % powder Take 1 packet by mouth 1 day or 1 dose.    . Tamsulosin HCl (FLOMAX) 0.4 MG CAPS  Take 0.4 mg by mouth daily.     [name] 20 MG TABS tablet TAKE ONE TABLET BY MOUTH ONCE DAILY WITH SUPPER 90 tablet 3   No current facility-administered medications for this visit.    Allergies  Allergen Reactions  . Bee Venom Swelling  . Penicillins     REACTION: rash  . Shellfish Allergy Hives    Accelerated allergic reaction; questionable laryngospasm or laryngeal edema with some difficulty breathing prompting ED evaluation and treatment at [place] History  . Marital Status: Divorced    Spouse Name: N/A  . Number of Children: N/A  . Years of Education: N/A   Occupational History  . retired    Social History Main Topics  . Smoking status: Former Smoker -- 1.00 packs/day for .5 years    Types: Cigarettes    Quit date: 03/27/1962  . Smokeless tobacco: Never Used  . Alcohol Use: No  . Drug Use: No  . Sexual Activity: Not on file   Other Topics Concern  . Not on file   Social History Narrative   Divorced with 3 adult children. Semi retired.      Review of Systems: General: negative for chills,  fever, night sweats or weight changes.  Cardiovascular: negative for chest pain, dyspnea on exertion, edema, orthopnea, palpitations, paroxysmal nocturnal dyspnea or shortness of breath Dermatological: negative for rash Respiratory: negative for cough or wheezing Urologic: negative for hematuria Frequent nocturnal urination Abdominal: negative for nausea, vomiting, diarrhea, bright red blood per rectum, melena, or hematemesis Neurologic: negative for visual changes, syncope, or dizziness All other systems reviewed and are otherwise negative except as noted above.    Blood pressure 128/72, pulse 83, height 5\' 8"  (1.727 m), weight 243 lb (110.224 kg), SpO2 93 %.  General appearance: alert, cooperative, no distress and moderately obese Neck: no carotid bruit and no JVD Lungs: clear to auscultation bilaterally Heart: regular rate and  rhythm Extremities: no edema Neurologic: Grossly normal  EKG V paced  ASSESSMENT AND PLAN:   Fatigue Pt feels this is secondary to being back in AF  Atrial fibrillation (HCC) And atrial flutter- s/p RFA July 2016, Flecainide started after for recurrent AF  Sick sinus syndrome Paroxysmal atrial fibrillation; bradycardia requiring permanent pacing  PPM-St.Jude St Jude- 2009  OSA (obstructive sleep apnea) He reports compliance with C-pap  Hypertension Controlled  Chronic anticoagulation Xarelto   PLAN  Discussed with Dr Lovena Le. We will arrange for OP DCCV. A St Jude rep will need to be available to interrogate his pacemaker. The pt says he has not missed any doses of Xarelto.   Kerin Ransom K PA-C 11/18/2015 4:04 PM

## 2015-11-18 NOTE — Patient Instructions (Signed)
Your physician has recommended that you have a Cardioversion (DCCV). Electrical Cardioversion uses a jolt of electricity to your heart either through paddles or wired patches attached to your chest. This is a controlled, usually prescheduled, procedure. Defibrillation is done under light anesthesia in the hospital, and you usually go home the day of the procedure. This is done to get your heart back into a normal rhythm. You are not awake for the procedure. Please see the instruction sheet given to you today.  Your physician recommends that you continue on your current medications as directed. Please refer to the Current Medication list given to you today.  If you need a refill on your cardiac medications before your next appointment, please call your pharmacy.  Thank you for choosing [facility]!

## 2015-11-18 NOTE — Assessment & Plan Note (Signed)
He reports compliance with C-pap 

## 2015-11-18 NOTE — Assessment & Plan Note (Signed)
Pt feels this is secondary to being back in AF

## 2015-11-18 NOTE — Assessment & Plan Note (Signed)
Xarelto

## 2015-11-19 ENCOUNTER — Telehealth: Payer: Self-pay

## 2015-11-19 ENCOUNTER — Encounter (HOSPITAL_COMMUNITY): Payer: Self-pay | Admitting: Anesthesiology

## 2015-11-19 ENCOUNTER — Encounter (HOSPITAL_COMMUNITY)
Admission: RE | Disposition: A | Discharge status: Home / Self Care | Payer: Self-pay | Source: Ambulatory Visit | Attending: Cardiology

## 2015-11-19 ENCOUNTER — Ambulatory Visit (HOSPITAL_COMMUNITY)
Admission: RE | Admit: 2015-11-19 | Discharge: 2015-11-19 | Disposition: A | Discharge status: Home / Self Care | Payer: Medicare Other | Source: Ambulatory Visit | Attending: Cardiology | Admitting: Cardiology

## 2015-11-19 ENCOUNTER — Encounter (HOSPITAL_COMMUNITY): Payer: Self-pay

## 2015-11-19 DIAGNOSIS — Z7901 Long term (current) use of anticoagulants: Secondary | ICD-10-CM | POA: Diagnosis not present

## 2015-11-19 DIAGNOSIS — Z01810 Encounter for preprocedural cardiovascular examination: Secondary | ICD-10-CM | POA: Insufficient documentation

## 2015-11-19 DIAGNOSIS — I480 Paroxysmal atrial fibrillation: Secondary | ICD-10-CM | POA: Diagnosis not present

## 2015-11-19 DIAGNOSIS — Z79899 Other long term (current) drug therapy: Secondary | ICD-10-CM

## 2015-11-19 HISTORY — DX: Presence of cardiac pacemaker: Z95.0

## 2015-11-19 LAB — CBC WITH DIFFERENTIAL/PLATELET
Basophils Absolute: 0 10*3/uL (ref 0.0–0.1)
Basophils Relative: 0 % (ref 0–1)
Eosinophils Absolute: 0.2 10*3/uL (ref 0.0–0.7)
Eosinophils Relative: 2 % (ref 0–5)
HCT: 40.1 % (ref 39.0–52.0)
Hemoglobin: 14.2 g/dL (ref 13.0–17.0)
Lymphocytes Relative: 29 % (ref 12–46)
Lymphs Abs: 2.6 10*3/uL (ref 0.7–4.0)
MCH: 32.6 pg (ref 26.0–34.0)
MCHC: 35.4 g/dL (ref 30.0–36.0)
MCV: 92.2 fL (ref 78.0–100.0)
MPV: 9 fL (ref 8.6–12.4)
Monocytes Absolute: 1 10*3/uL (ref 0.1–1.0)
Monocytes Relative: 11 % (ref 3–12)
Neutro Abs: 5.3 10*3/uL (ref 1.7–7.7)
Neutrophils Relative %: 58 % (ref 43–77)
Platelets: 225 10*3/uL (ref 150–400)
RBC: 4.35 MIL/uL (ref 4.22–5.81)
RDW: 14.5 % (ref 11.5–15.5)
WBC: 9.1 10*3/uL (ref 4.0–10.5)

## 2015-11-19 LAB — BASIC METABOLIC PANEL
BUN: 19 mg/dL (ref 7–25)
CO2: 26 mmol/L (ref 20–31)
Calcium: 9.5 mg/dL (ref 8.6–10.3)
Chloride: 101 mmol/L (ref 98–110)
Creat: 1.37 mg/dL — ABNORMAL HIGH (ref 0.70–1.18)
Glucose, Bld: 91 mg/dL (ref 65–99)
Potassium: 4.4 mmol/L (ref 3.5–5.3)
Sodium: 139 mmol/L (ref 135–146)

## 2015-11-19 LAB — PROTIME-INR
INR: 1.17 (ref <1.50)
Prothrombin Time: 15 seconds (ref 11.6–15.2)

## 2015-11-19 SURGERY — CARDIOVERSION
Anesthesia: Monitor Anesthesia Care

## 2015-11-19 MED ORDER — HYDROCORTISONE 1 % EX CREA
1.0000 "application " | TOPICAL_CREAM | Freq: Three times a day (TID) | CUTANEOUS | Status: DC | PRN
  Filled 2015-11-19: qty 28

## 2015-11-19 NOTE — Telephone Encounter (Signed)
Received call from Dr Domenic Polite, get flecainide level,schedule apt ASAP with Dr Babs Bertin cancelled today as patient was in [place]. I spoke with patient,he will get lab now and I will let him know what day and time his apt with Dr. Lovena Le is.

## 2015-11-19 NOTE — H&P (View-Only) (Signed)
11/18/2015 [name].   [birth year]  [id number]  Primary Physician [name], MD Primary Cardiologist: Dr Lovena Le  HPI:  75 y/o male with a history of PAF and SSS. He has had prior RFA-July2016. He apparently went back into AF shortly afterwards and was placed on Flecainide with return to NSR. He is in the office today because he feels he is back in AF. He is fatigued and dizzy. He says he felt great for a time after hiss cardioversion but has had progressive symptoms over the past weeks. His EKG in the office today is V paced at 75, I can't see clear P waves.    Current Outpatient Prescriptions  Medication Sig Dispense Refill  . Calcium Carbonate-Vitamin D (CALCIUM-VITAMIN D3) 600-125 MG-UNIT TABS Take 1,200 mg by mouth 2 (two) times daily.     . DESMOPRESSIN ACETATE PO Take 0.4 mg by mouth daily.    Marland Kitchen diltiazem (TIAZAC) 120 MG 24 hr capsule Take 120 mg by mouth every evening.    . diltiazem (TIAZAC) 300 MG 24 hr capsule Take 300 mg by mouth every morning.    . docusate sodium (COLACE) 100 MG capsule Take 100 mg by mouth 2 (two) times daily as needed for mild constipation.    . flecainide (TAMBOCOR) 100 MG tablet Take 1 tablet (100 mg total) by mouth 2 (two) times daily. 180 tablet 3  . gabapentin (NEURONTIN) 800 MG tablet Take 800 mg by mouth 2 (two) times daily.    . hydrocortisone (ANUSOL-HC) 2.5 % rectal cream Place 1 application rectally 2 (two) times daily as needed for hemorrhoids or itching.    . metoprolol tartrate (LOPRESSOR) 25 MG tablet Take 0.5 tablets (12.5 mg total) by mouth 2 (two) times daily. 90 tablet 3  . morphine (MS CONTIN) 15 MG 12 hr tablet Take 15 mg by mouth every 12 (twelve) hours.    . pantoprazole (PROTONIX) 40 MG tablet Take 40 mg by mouth daily.    . polyethylene glycol (MIRALAX / GLYCOLAX) packet Take 17 g by mouth at bedtime.     . psyllium (METAMUCIL) 58.6 % powder Take 1 packet by mouth 1 day or 1 dose.    . Tamsulosin HCl (FLOMAX) 0.4 MG CAPS  Take 0.4 mg by mouth daily.     [name] 20 MG TABS tablet TAKE ONE TABLET BY MOUTH ONCE DAILY WITH SUPPER 90 tablet 3   No current facility-administered medications for this visit.    Allergies  Allergen Reactions  . Bee Venom Swelling  . Penicillins     REACTION: rash  . Shellfish Allergy Hives    Accelerated allergic reaction; questionable laryngospasm or laryngeal edema with some difficulty breathing prompting ED evaluation and treatment at [place] History  . Marital Status: Divorced    Spouse Name: N/A  . Number of Children: N/A  . Years of Education: N/A   Occupational History  . retired    Social History Main Topics  . Smoking status: Former Smoker -- 1.00 packs/day for .5 years    Types: Cigarettes    Quit date: 03/27/1962  . Smokeless tobacco: Never Used  . Alcohol Use: No  . Drug Use: No  . Sexual Activity: Not on file   Other Topics Concern  . Not on file   Social History Narrative   Divorced with 3 adult children. Semi retired.      Review of Systems: General: negative for chills,  fever, night sweats or weight changes.  Cardiovascular: negative for chest pain, dyspnea on exertion, edema, orthopnea, palpitations, paroxysmal nocturnal dyspnea or shortness of breath Dermatological: negative for rash Respiratory: negative for cough or wheezing Urologic: negative for hematuria Frequent nocturnal urination Abdominal: negative for nausea, vomiting, diarrhea, bright red blood per rectum, melena, or hematemesis Neurologic: negative for visual changes, syncope, or dizziness All other systems reviewed and are otherwise negative except as noted above.    Blood pressure 128/72, pulse 83, height 5\' 8"  (1.727 m), weight 243 lb (110.224 kg), SpO2 93 %.  General appearance: alert, cooperative, no distress and moderately obese Neck: no carotid bruit and no JVD Lungs: clear to auscultation bilaterally Heart: regular rate and  rhythm Extremities: no edema Neurologic: Grossly normal  EKG V paced  ASSESSMENT AND PLAN:   Fatigue Pt feels this is secondary to being back in AF  Atrial fibrillation (HCC) And atrial flutter- s/p RFA July 2016, Flecainide started after for recurrent AF  Sick sinus syndrome Paroxysmal atrial fibrillation; bradycardia requiring permanent pacing  PPM-St.Jude St Jude- 2009  OSA (obstructive sleep apnea) He reports compliance with C-pap  Hypertension Controlled  Chronic anticoagulation Xarelto   PLAN  Discussed with Dr Lovena Le. We will arrange for OP DCCV. A St Jude rep will need to be available to interrogate his pacemaker. The pt says he has not missed any doses of Xarelto.   Kerin Ransom K PA-C 11/18/2015 4:04 PM

## 2015-11-19 NOTE — Interval H&P Note (Signed)
History and Physical Interval Note:  11/19/2015 9:28 AM  Patient presented for elective cardioversion of persistent, symptomatic atrial fibrillation. ECG showed atrial pacing, confirmed by device interrogation. He had been in atrial fibrillation in the early morning hours. Procedure canceled with plan for him to follow-up in the office with Dr. Lovena Le, continue medical therapy including flecainide. A flecainide level. will also be obtained.  Satira Sark, M.D., F.A.C.C.

## 2015-11-19 NOTE — Progress Notes (Signed)
Patient's EKG revealed 1005 Atrial paced rhythm.  Interrogation of Pacemaker showed NSR as underlying rhythm.  Dr Gwen Her notified and procedure cancelled.  Patient made aware that office would call him with follow up appointment with Dr Lovena Le.  Verbalized understanding.  No complaints noted at time of discharge.

## 2015-11-24 LAB — FLECAINIDE LEVEL: Flecainide: 0.59 ug/mL (ref 0.20–1.00)

## 2015-11-26 ENCOUNTER — Encounter: Payer: Self-pay | Admitting: Internal Medicine

## 2015-11-26 ENCOUNTER — Ambulatory Visit (INDEPENDENT_AMBULATORY_CARE_PROVIDER_SITE_OTHER): Payer: Medicare Other | Admitting: Internal Medicine

## 2015-11-26 ENCOUNTER — Ambulatory Visit: Payer: Medicare Other | Admitting: Internal Medicine

## 2015-11-26 VITALS — BP 116/62 | HR 79 | Ht 68.0 in | Wt 241.0 lb

## 2015-11-26 DIAGNOSIS — I4891 Unspecified atrial fibrillation: Secondary | ICD-10-CM | POA: Diagnosis not present

## 2015-11-26 MED ORDER — FLECAINIDE ACETATE 100 MG PO TABS: 150.0000 mg | ORAL_TABLET | Freq: Two times a day (BID) | ORAL | Status: DC

## 2015-11-26 NOTE — Patient Instructions (Addendum)
Your physician recommends that you schedule a follow-up appointment in: 3 Months with Dr. Lovena Le  Your physician has recommended you make the following change in your medication:   Increase Flecainide to 150 mg Two Times Daily  Please return in 1 1/2 weeks for lab work and EKG   If you need a refill on your cardiac medications before your next appointment, please call your pharmacy.  Thank you for choosing [facility]!

## 2015-11-26 NOTE — Telephone Encounter (Signed)
Pt has appt with Dr. Lovena Le today.

## 2015-11-26 NOTE — Progress Notes (Signed)
HPI [name] returns today for followup. He is a very pleasant 75 year old man with a history of symptomatic bradycardia, paroxysmal atrial fibrillation and persistent atrial flutter, borderline hypertension, status post permanent pacemaker insertion. The patient wears CPAP at home. He was seen by me several months ago after undergoing typical atrial flutter catheter ablation.  He Has been placed on flecainide and returned to NSR. He underwent stress testing demonstrating no stress induced arrhythmias. Ischemia could not be evaluated because of pacing. He had no angina. Since his last check a week ago, he has had 20% atrial fib. He is mildly symptomatic. He has questions today about his metoprolol. Allergies  Allergen Reactions  . Bee Venom Swelling  . Penicillins     REACTION: rash  . Shellfish Allergy Hives    Accelerated allergic reaction; questionable laryngospasm or laryngeal edema with some difficulty breathing prompting ED evaluation and treatment at [facility]     Current Outpatient Prescriptions  Medication Sig Dispense Refill  . Calcium Carbonate-Vitamin D (CALCIUM-VITAMIN D3) 600-125 MG-UNIT TABS Take 1,200 mg by mouth 2 (two) times daily.     . DESMOPRESSIN ACETATE PO Take 0.4 mg by mouth daily.    Marland Kitchen diltiazem (TIAZAC) 120 MG 24 hr capsule Take 120 mg by mouth every evening.    . diltiazem (TIAZAC) 300 MG 24 hr capsule Take 300 mg by mouth every morning.    . docusate sodium (COLACE) 100 MG capsule Take 100 mg by mouth 2 (two) times daily as needed for mild constipation.    . flecainide (TAMBOCOR) 100 MG tablet Take 1 tablet (100 mg total) by mouth 2 (two) times daily. 180 tablet 3  . gabapentin (NEURONTIN) 800 MG tablet Take 800 mg by mouth 2 (two) times daily.    . hydrocortisone (ANUSOL-HC) 2.5 % rectal cream Place 1 application rectally 2 (two) times daily as needed for hemorrhoids or itching.    . metoprolol tartrate (LOPRESSOR) 25 MG tablet Take 0.5 tablets (12.5 mg total)  by mouth 2 (two) times daily. 90 tablet 3  . morphine (MS CONTIN) 15 MG 12 hr tablet Take 15 mg by mouth every 12 (twelve) hours.    . pantoprazole (PROTONIX) 40 MG tablet Take 40 mg by mouth daily.    . polyethylene glycol (MIRALAX / GLYCOLAX) packet Take 17 g by mouth at bedtime.     . psyllium (METAMUCIL) 58.6 % powder Take 1 packet by mouth 1 day or 1 dose.    . rivaroxaban (XARELTO) 20 MG TABS tablet Take 1 tablet (20 mg total) by mouth daily with supper. 90 tablet 3  . Tamsulosin HCl (FLOMAX) 0.4 MG CAPS Take 0.4 mg by mouth daily.      No current facility-administered medications for this visit.     Past Medical History  Diagnosis Date  . General weakness     +malaise, exercise intolerance, and near syncope  . Atrial flutter ([place])     a. s/p ablation 01/2015.  Marland Kitchen Hypertension   . BPH (benign prostatic hyperplasia)   . Cerebrovascular disease     carotid bruit; no focal stenosis by carotid ultrasound in 05/2008  . Low back pain     history of traumax2  . Tachy-brady syndrome ([place])     a. s/p St. Jude pacemaker 2009.  Marland Kitchen Paroxysmal atrial fibrillation (HCC)   . Sleep apnea     c pap at night  . Dysrhythmia   . Presence of permanent cardiac pacemaker 2009    ROS:  All systems reviewed and negative except as noted in the HPI.   Past Surgical History  Procedure Laterality Date  . Cervical discectomy  2006  . Cataract extraction      Bilateral  . Electrophysiologic study N/A 01/18/2015    Procedure: A-Flutter/A-Tach/Svt Ablation;  Surgeon: [name], MD;  Location: [facility];  Service: Cardiovascular;  Laterality: N/A;  . Ablation    . Knee arthroscopy with medial menisectomy Left 06/03/2015    Procedure: KNEE ARTHROSCOPY WITH MEDIAL MENISECTOMY;  Surgeon: [name], MD;  Location: [facility];  Service: Orthopedics;  Laterality: Left;  . Pacemaker insertion  1966     Family History  Problem Relation Age of Onset  . Hypertension       Social  History   Social History  . Marital Status: Divorced    Spouse Name: N/A  . Number of Children: N/A  . Years of Education: N/A   Occupational History  . retired    Social History Main Topics  . Smoking status: Former Smoker -- 1.00 packs/day for .5 years    Types: Cigarettes    Quit date: 03/27/1962  . Smokeless tobacco: Never Used  . Alcohol Use: No  . Drug Use: No  . Sexual Activity: Not on file   Other Topics Concern  . Not on file   Social History Narrative   Divorced with 3 adult children. Semi retired.      BP 116/62 mmHg  Pulse 79  Ht 5\' 8"  (1.727 m)  Wt 241 lb (109.317 kg)  BMI 36.65 kg/m2  SpO2 93%  Physical Exam:  Well appearing 75 year old man,NAD HEENT: Unremarkable Neck:  6 cm JVD, no thyromegally Lungs:  Clear, with no wheezes, rales, or rhonchi. HEART:  Regular rate rhythm, no murmurs, no rubs, no clicks Abd:  soft, obese,positive bowel sounds, no organomegally, no rebound, no guarding Ext:  2 plus pulses, no edema, no cyanosis, no clubbing Skin:  No rashes no nodules Neuro:  CN II through XII intact, motor grossly intact  ECG - NSR with atrial pacing  DEVICE  Normal device function.  See PaceArt for details.   Assess/Plan: 1. Atrial fib - he is having some breakthrough atrial fib and I have asked him to increase his flecainide to 150 bid. He will obtain a trough flecainide level and a 12 lead ecg on the higher dose in 1-2 weeks.  2. Atrial flutter - he has had no recurrent typical flutter 3. HTN - we discussed stopping his metoprolol but I would like him to stay on both the beta blocker and calcium blocker 4. Sinus node dysfunction - he is s/p PPM. His St. Jude PPM is working normally and he is asymptomatic.  Mikle Bosworth.D.

## 2015-12-08 ENCOUNTER — Other Ambulatory Visit (HOSPITAL_COMMUNITY)
Admission: RE | Admit: 2015-12-08 | Discharge: 2015-12-08 | Disposition: A | Discharge status: Home / Self Care | Payer: Medicare Other | Source: Ambulatory Visit | Attending: Internal Medicine | Admitting: Internal Medicine

## 2015-12-08 ENCOUNTER — Ambulatory Visit (INDEPENDENT_AMBULATORY_CARE_PROVIDER_SITE_OTHER): Payer: Medicare Other | Admitting: *Deleted

## 2015-12-08 DIAGNOSIS — I4891 Unspecified atrial fibrillation: Secondary | ICD-10-CM | POA: Diagnosis not present

## 2015-12-08 DIAGNOSIS — Z79899 Other long term (current) drug therapy: Secondary | ICD-10-CM | POA: Insufficient documentation

## 2015-12-08 DIAGNOSIS — Z5181 Encounter for therapeutic drug level monitoring: Secondary | ICD-10-CM | POA: Diagnosis not present

## 2015-12-08 NOTE — Progress Notes (Signed)
Patient c/o dizziness and weakness after increase in Flecinide. No other complaints at this time.

## 2015-12-09 LAB — FLECAINIDE LEVEL: Flecainide: 0.67 ug/mL (ref 0.20–1.00)

## 2015-12-10 ENCOUNTER — Telehealth: Payer: Self-pay | Admitting: *Deleted

## 2015-12-10 NOTE — Telephone Encounter (Signed)
-----   Message from [name], MD sent at 12/09/2015  9:36 AM EDT ----- Let him know that this usually improves. If it lasts another 2 weeks, then we will try something else. GT ----- Message -----    From: Levonne Hubert, LPN    Sent: [id number]   8:32 AM      To: [name], MD

## 2015-12-10 NOTE — Telephone Encounter (Signed)
Patient notified. Patient voiced understanding.

## 2015-12-13 LAB — CUP PACEART INCLINIC DEVICE CHECK
Implantable Lead Implant Date: 20090903
Implantable Lead Location: 753860
MDC IDC LEAD IMPLANT DT: 20090903
MDC IDC LEAD LOCATION: 753859
MDC IDC PG SERIAL: 1269181
MDC IDC SESS DTM: 20170327134339
Pulse Gen Model: 5826

## 2016-01-11 ENCOUNTER — Other Ambulatory Visit: Payer: Self-pay | Admitting: Internal Medicine

## 2016-01-13 DIAGNOSIS — I10 Essential (primary) hypertension: Secondary | ICD-10-CM | POA: Diagnosis not present

## 2016-01-13 DIAGNOSIS — R251 Tremor, unspecified: Secondary | ICD-10-CM | POA: Diagnosis not present

## 2016-01-13 DIAGNOSIS — I480 Paroxysmal atrial fibrillation: Secondary | ICD-10-CM | POA: Diagnosis not present

## 2016-02-08 DIAGNOSIS — S30861A Insect bite (nonvenomous) of abdominal wall, initial encounter: Secondary | ICD-10-CM | POA: Diagnosis not present

## 2016-02-22 ENCOUNTER — Ambulatory Visit (INDEPENDENT_AMBULATORY_CARE_PROVIDER_SITE_OTHER): Payer: Medicare Other | Admitting: Neurology

## 2016-02-22 ENCOUNTER — Encounter: Payer: Self-pay | Admitting: Neurology

## 2016-02-22 VITALS — BP 118/70 | HR 70 | Ht 68.0 in | Wt 241.8 lb

## 2016-02-22 DIAGNOSIS — G629 Polyneuropathy, unspecified: Secondary | ICD-10-CM | POA: Diagnosis not present

## 2016-02-22 DIAGNOSIS — R208 Other disturbances of skin sensation: Secondary | ICD-10-CM | POA: Diagnosis not present

## 2016-02-22 DIAGNOSIS — R531 Weakness: Secondary | ICD-10-CM

## 2016-02-22 DIAGNOSIS — D531 Other megaloblastic anemias, not elsewhere classified: Secondary | ICD-10-CM | POA: Diagnosis not present

## 2016-02-22 DIAGNOSIS — R200 Anesthesia of skin: Secondary | ICD-10-CM | POA: Diagnosis not present

## 2016-02-22 NOTE — Patient Instructions (Addendum)
Remember to drink plenty of fluid, eat healthy meals and do not skip any meals. Try to eat protein with a every meal and eat a healthy snack such as fruit or nuts in between meals. Try to keep a regular sleep-wake schedule and try to exercise daily, particularly in the form of walking, 20-30 minutes a day, if you can.   As far as diagnostic testing: Labs, CT of the lumbar spine, emg/ncs  I would like to see you back for emg/ncs, sooner if we need to. Please call us with any interim questions, concerns, problems, updates or refill requests.   Our phone number is [phone]. We also have an after hours call service for urgent matters and there is a physician on-call for urgent questions. For any emergencies you know to call 911 or go to the nearest emergency room

## 2016-02-22 NOTE — Progress Notes (Signed)
[facility]    Provider:  Dr Jaynee Eagles Referring Provider: [name], MD Primary Care Physician:  [name], MD  CC:  Low back pain and numb legs, pain all over, weakness in the arms and legs, hands are shaking  HPI:  [name]. is a 75 y.o. male here as a referral from Dr. Willey Blade for low back pain and numb legs. LBP since 1964. PMHx of HTN, OSA on CPAP, BPH, paroxysmal atrial fib, paroxysmal atrial flutter, tachy-brady s/p St. Jude pacemaker 2009. Patient reports a multitude of symptoms including severe low back pain, numbness in the entirety of both his legs when he bends over even just a little bit, numbness and tremors in the arms, weakness in the arms and legs, dizziness, paresthesias and pain. Low back pain has been going on since 1964. [facility] He saw Dr. Niel Hummer and he was seen several times and had shots in the spine. His has decreased ROM can't bend over. He gets a funny feeling in the back and legs go numb all the way down and feels weakness in his legs. Last week he was on a riding mower and legs got numb when he got off after he got a leaf blower. He had to clean out the counter and he was down on his knees and he was raking things out of the counter and right arm got very weak and collapsed and any kind of strain on the arms will make then go weak and shake. Positional. The symptoms are episodic. He was started on Flecanide. Symptoms in the hands started with Flecainide. Driving up he was feeling some tingling in the fingers. No falls.  Numbness in the legs started several years ago and worsening . Headache. Head feels numb. Can't have an MRi brain.   Reviewed notes, labs and imaging from outside physicians, which showed:  CBC March 2017 is normal, BMP in March 2017 showed creatinine 1.37 which was elevated from previous baseline.  Patient wrote a list today includes the following symptoms: Severe lower back pain, centered  to the right is spine, neck pain, severe shaking of hands, arms and legs, extreme weakness in arms and legs, dizziness, sleeping leg, not eating well until afternoon, leaning forward few degrees in legs get numb, tingling feelings, pain and tingling when putting on shoes especially right foot  MRI of the lumbar spine April 2008: The conus medullaris extends to the T12-L1 disc space level and appears normal. There are no paraspinal abnormalities.  There are no disc space findings from T11-12 through L2-3.  L3-4: Minimal disc bulge. No spinal stenosis or nerve root encroachment.  L4-5: Stable mild disc bulge and bilateral facet hypertrophy. No spinal stenosis or nerve root encroachment. L5-S1: Pseudo disc bulge related to the pars defects and resulting anterolisthesis are again noted. The central canal and lateral recesses remain adequately patent. However, there is persistent biforaminal stenosis, left greater than right. Bilateral L5 nerve root encroachment is likely. IMPRESSION: 1. Overall, little change is seen from the prior examination of 03/24/05. 2. Bilateral L5 pars defects and resulting 9 mm of anterolisthesis at L5-S1 result in significant biforaminal stenosis and probable bilateral L5 nerve root encroachment.  3. No other significant disc space findings. Mild disc bulging at L3-4 and L4-5 appears stable.   Review of Systems: Patient complains of symptoms per HPI as well as the following symptoms: Fatigue, easy bleeding. Pertinent negatives per HPI. All others negative.  Social History   Social History  . Marital Status: Divorced    Spouse Name: N/A  . Number of Children: 3  . Years of Education: 12+   Occupational History  . Retired    Social History Main Topics  . Smoking status: Former Smoker -- 1.00 packs/day for .5 years    Types: Cigarettes    Quit date: 03/27/1962  . Smokeless tobacco: Never Used  . Alcohol Use: No  . Drug Use: No  . Sexual  Activity: Not on file   Other Topics Concern  . Not on file   Social History Narrative   Divorced with 3 adult children. Semi retired.    Caffeine use: once daily       Family History  Problem Relation Age of Onset  . Hypertension    . Neuropathy Neg Hx   . Stroke Neg Hx     Past Medical History  Diagnosis Date  . General weakness     +malaise, exercise intolerance, and near syncope  . Atrial flutter ([place])     a. s/p ablation 01/2015.  Marland Kitchen Hypertension   . BPH (benign prostatic hyperplasia)   . Cerebrovascular disease     carotid bruit; no focal stenosis by carotid ultrasound in 05/2008  . Low back pain     history of traumax2  . Tachy-brady syndrome ([place])     a. s/p St. Jude pacemaker 2009.  Marland Kitchen Paroxysmal atrial fibrillation ([facility])   . Sleep apnea     c pap at night  . Dysrhythmia   . Presence of permanent cardiac pacemaker 2009    Past Surgical History  Procedure Laterality Date  . Cervical discectomy  2006  . Cataract extraction      Bilateral  . Electrophysiologic study N/A 01/18/2015    Procedure: A-Flutter/A-Tach/Svt Ablation;  Surgeon: [name], MD;  Location: [facility];  Service: Cardiovascular;  Laterality: N/A;  . Ablation    . Knee arthroscopy with medial menisectomy Left 06/03/2015    Procedure: KNEE ARTHROSCOPY WITH MEDIAL MENISECTOMY;  Surgeon: [name], MD;  Location: [facility];  Service: Orthopedics;  Laterality: Left;  . Pacemaker insertion  1966    Current Outpatient Prescriptions  Medication Sig Dispense Refill  . Calcium Carbonate-Vitamin D (CALCIUM-VITAMIN D3) 600-125 MG-UNIT TABS Take 1,200 mg by mouth 2 (two) times daily.     . Cholecalciferol (VITAMIN D-3 PO) Take 2,000 Units by mouth daily.    Marland Kitchen diltiazem (TIAZAC) 120 MG 24 hr capsule Take 120 mg by mouth every morning.     . diltiazem (TIAZAC) 300 MG 24 hr capsule Take 300 mg by mouth every evening.     . docusate sodium (COLACE) 100 MG capsule Take 100 mg by mouth 2  (two) times daily as needed for mild constipation.    . flecainide (TAMBOCOR) 100 MG tablet Take 1.5 tablets (150 mg total) by mouth 2 (two) times daily. 90 tablet 6  . gabapentin (NEURONTIN) 800 MG tablet Take 800 mg by mouth 2 (two) times daily. 300mg  in the morning and 600mg  in the evening.    . hydrocortisone (ANUSOL-HC) 2.5 % rectal cream Place 1 application rectally 2 (two) times daily as needed for hemorrhoids or itching.    . metoprolol tartrate (LOPRESSOR) 25 MG tablet Take 0.5 tablets (12.5 mg total) by mouth 2 (two) times daily. 90 tablet 3  . morphine (MS CONTIN) 15 MG 12 hr tablet Take 15 mg by mouth every 12 (twelve)  hours.    . pantoprazole (PROTONIX) 40 MG tablet Take 40 mg by mouth daily.    . polyethylene glycol (MIRALAX / GLYCOLAX) packet Take 17 g by mouth at bedtime.     . psyllium (METAMUCIL) 58.6 % powder Take 1 packet by mouth 1 day or 1 dose.    . rivaroxaban (XARELTO) 20 MG TABS tablet Take 1 tablet (20 mg total) by mouth daily with supper. 90 tablet 3  . Tamsulosin HCl (FLOMAX) 0.4 MG CAPS Take 0.4 mg by mouth daily.      No current facility-administered medications for this visit.    Allergies as of 02/22/2016 - Review Complete 02/22/2016  Allergen Reaction Noted  . Bee venom Swelling 01/18/2015  . Penicillins    . Shellfish allergy Hives 04/15/2012    Vitals: BP 118/70 mmHg  Pulse 70  Ht 5\' 8"  (1.727 m)  Wt 241 lb 12.8 oz (109.68 kg)  BMI 36.77 kg/m2 Last Weight:  Wt Readings from Last 1 Encounters:  02/22/16 241 lb 12.8 oz (109.68 kg)   Last Height:   Ht Readings from Last 1 Encounters:  02/22/16 5\' 8"  (1.727 m)    Physical exam: Exam: Gen: NAD, conversant, well nourised, obese, well groomed                     CV: RRR, no MRG. No Carotid Bruits. No peripheral edema, warm, nontender Eyes: Conjunctivae clear without exudates or hemorrhage  Neuro: Detailed Neurologic Exam  Speech:    Speech is normal; fluent and spontaneous with normal  comprehension.  Cognition:    The patient is oriented to person, place, and time;     recent and remote memory intact;     language fluent;     normal attention, concentration,     fund of knowledge Cranial Nerves:    The pupils are equal, round, and reactive to light. Attempted funduscopic exam could not visualize due to small pupils. Visual fields are full to finger confrontation. Extraocular movements are intact. Trigeminal sensation is intact and the muscles of mastication are normal. The face is symmetric. The palate elevates in the midline. Hearing intact. Voice is normal. Shoulder shrug is normal. The tongue has normal motion without fasciculations.   Coordination:    Normal finger to nose and heel to shin. Normal rapid alternating movements.   Gait:    Wide-based, no ataxia.  Motor Observation: Mild positional high frequrncy tremor. No asymmetry, no atrophy.  Tone:    Normal muscle tone.    Posture:    Posture is normal. normal erect. Romberg negative.     Strength: Left hip flexion 4/5. Mildly weak grip otherwise intrinsic hand muscles normal    Otherwise strength is V/V in the upper and lower limbs.      Sensation: intact to LT, pin prick, vibration, temperature and proprioception distally in the upper and lower extremities     Reflex Exam:  DTR's:    Deep tendon reflexes in the upper and lower extremities are normal bilaterally.   Toes:    The toes are downgoing bilaterally.   Clonus:    Clonus is absent.      Assessment/Plan:  75 y.o. male here as a referral from Dr. Willey Blade for low back pain and numb legs. LBP since 1964. PMHx of HTN, OSA on CPAP, BPH, paroxysmal atrial fib, paroxysmal atrial flutter, tachy-brady s/p St. Jude pacemaker 2009. Patient reports a multitude of symptoms including severe low back pain, numbness  in the entirety of both his legs when he bends over even just a little bit, numbness and tremors in the arms, weakness in the arms and legs,  dizziness, paresthesias and pain. Neurologic clinical deficits are not significant: Patient's sensory exam is intact without decrease in any of the sensory modalities in his distal upper and lower extremities; He has a very mild distractible postural tremor; He has some very mild left hip flexion weakness and mildly weak grip otherwise strength is very good.   As far as diagnostic testing: Labs,  Emg/ncs of one leg and one arm. Will repeat a CT of his lumbar spine in addition to flex/ex xrays of lumbar spine After this workup will consider further evaluation.  We'll try and request records from the [facility] however is often difficult.  I would like to see you back for emg/ncs, sooner if we need to. Please call us with any interim questions, concerns, problems, updates or refill requests.   Sarina Ill, MD  [facility] [address] [place], [place] [address]  Phone [phone] Fax [phone]

## 2016-02-23 ENCOUNTER — Encounter: Payer: Self-pay | Admitting: Neurology

## 2016-02-23 ENCOUNTER — Telehealth: Payer: Self-pay | Admitting: *Deleted

## 2016-02-23 ENCOUNTER — Telehealth: Payer: Self-pay | Admitting: Neurology

## 2016-02-23 LAB — B12 AND FOLATE PANEL
Folate: >20 ng/mL (ref >3.0)
Vitamin B-12: 455 pg/mL (ref 211–946)

## 2016-02-23 LAB — B. BURGDORFI ANTIBODIES: Lyme IgG/IgM Ab: <0.91 {ISR} (ref 0.00–0.90)

## 2016-02-23 LAB — TSH: TSH: 2.05 u[IU]/mL (ref 0.450–4.500)

## 2016-02-23 NOTE — Telephone Encounter (Signed)
Called and spoke to pt about normal labs per Dr Jaynee Eagles. He verbalized understanding.

## 2016-02-23 NOTE — Telephone Encounter (Signed)
Would you please call patient and let him know that after a review of his records and previous lumbar images I think the first thing I would like to do it get a repeat CT of his lumbar spine and flexion/extension spine xrays. We will see him for the emg/ncs and review these before coming up with next steps. Speak to me before calling. Thanks.

## 2016-02-23 NOTE — Telephone Encounter (Signed)
-----   Message from Melvenia Beam, MD sent at 02/23/2016  5:41 PM EDT ----- Labs normal thanks!

## 2016-02-24 NOTE — Telephone Encounter (Signed)
Per Dr Jaynee Eagles, called patient and gave him information as relayed in her note, re: tests she has ordered. He stated he already has CT lumbar spine and EMG/NCS scheduled.  Reviewed dates/times of those appointments with him. Informed him he will receive call re; xray. He then stated he would like Dr Jaynee Eagles to be made aware that prior to his current issues he discovered a water leak in his home, and mold had set in. He stated he is just now getting all this taken care of. He stated he read on the Internet that mold can cause some of the problems he is having. He stated he wanted the dr to be aware. Informed him this RN would give Dr Jaynee Eagles this information. He verbalized understanding, appreciation.

## 2016-02-24 NOTE — Telephone Encounter (Signed)
Gregory Aas, do they call for xray appointment or does patient need to call them? Can you make sure and let patient know please? thanks

## 2016-02-28 NOTE — Telephone Encounter (Signed)
Rn unable to leave vm on patients phone. Pts vm not set up.to leave cm. Pt schedule for Ct lumbar spine on 03/03/2016 and DG lumbar order was not in [facility] and sent to [facility] imaging per [place] on 02/28/2016.

## 2016-03-03 ENCOUNTER — Ambulatory Visit
Admission: RE | Admit: 2016-03-03 | Discharge: 2016-03-03 | Disposition: A | Discharge status: Home / Self Care | Payer: Medicare Other | Source: Ambulatory Visit | Attending: Neurology | Admitting: Neurology

## 2016-03-03 DIAGNOSIS — M5126 Other intervertebral disc displacement, lumbar region: Secondary | ICD-10-CM | POA: Diagnosis not present

## 2016-03-03 DIAGNOSIS — R531 Weakness: Secondary | ICD-10-CM

## 2016-03-03 DIAGNOSIS — G629 Polyneuropathy, unspecified: Secondary | ICD-10-CM

## 2016-03-03 DIAGNOSIS — R200 Anesthesia of skin: Secondary | ICD-10-CM

## 2016-03-07 ENCOUNTER — Telehealth: Payer: Self-pay | Admitting: *Deleted

## 2016-03-07 NOTE — Telephone Encounter (Signed)
-----   Message from Melvenia Beam, MD sent at 03/06/2016  4:53 PM EDT ----- Really no change in the CT of his lumbar spine since March 2012. I order xrays of the lumbar spine with flexion/etension has the patient had those done yet? If not can you inform him how to get those done please? Those are the next images I need to see thanks.

## 2016-03-07 NOTE — Telephone Encounter (Signed)
Called and spoke to pt about CT lumbar results per Dr Jaynee Eagles note below. Pt verbalized understanding. Advised him to call [facility] imaging at [phone] to schedule xrays that Dr Jaynee Eagles ordered. He had not yet heard about scheduling.

## 2016-03-10 ENCOUNTER — Other Ambulatory Visit: Payer: Self-pay | Admitting: Neurology

## 2016-03-10 ENCOUNTER — Ambulatory Visit
Admission: RE | Admit: 2016-03-10 | Discharge: 2016-03-10 | Disposition: A | Discharge status: Home / Self Care | Payer: Medicare Other | Source: Ambulatory Visit | Attending: Neurology | Admitting: Neurology

## 2016-03-10 DIAGNOSIS — R200 Anesthesia of skin: Secondary | ICD-10-CM

## 2016-03-10 DIAGNOSIS — R531 Weakness: Secondary | ICD-10-CM

## 2016-03-10 DIAGNOSIS — M545 Low back pain: Secondary | ICD-10-CM | POA: Diagnosis not present

## 2016-03-13 ENCOUNTER — Telehealth: Payer: Self-pay

## 2016-03-13 NOTE — Telephone Encounter (Signed)
Rn call patient that there is no changes in his spine with flexion, and extension to explain his leg numbness with bending over. Pt verbalized understanding. Pt verify his appt on June 28 for EMG and nerve conduction test. PT stated he would like a copy of the results. Rn stated on Wednesday he can fill out a release form and get a copy while he is here. Pt verbalized understanding.

## 2016-03-13 NOTE — Telephone Encounter (Signed)
-----   Message from Melvenia Beam, MD sent at 03/12/2016  8:36 AM EDT ----- No significant changes in his spine with flexion and extension to explain his leg numbness with bending over. thanks

## 2016-03-15 ENCOUNTER — Ambulatory Visit (INDEPENDENT_AMBULATORY_CARE_PROVIDER_SITE_OTHER): Payer: Medicare Other | Admitting: Neurology

## 2016-03-15 ENCOUNTER — Ambulatory Visit (INDEPENDENT_AMBULATORY_CARE_PROVIDER_SITE_OTHER): Payer: Self-pay | Admitting: Neurology

## 2016-03-15 DIAGNOSIS — R531 Weakness: Secondary | ICD-10-CM | POA: Diagnosis not present

## 2016-03-15 DIAGNOSIS — R200 Anesthesia of skin: Secondary | ICD-10-CM

## 2016-03-15 DIAGNOSIS — M5440 Lumbago with sciatica, unspecified side: Secondary | ICD-10-CM

## 2016-03-15 DIAGNOSIS — M5416 Radiculopathy, lumbar region: Secondary | ICD-10-CM

## 2016-03-15 DIAGNOSIS — Z0289 Encounter for other administrative examinations: Secondary | ICD-10-CM

## 2016-03-15 MED ORDER — TRAMADOL HCL 50 MG PO TABS: ORAL_TABLET | ORAL | Status: DC

## 2016-03-15 NOTE — Progress Notes (Signed)
  [facility]    Provider:  Dr Jaynee Eagles Referring Provider: [name], MD Primary Care Physician:  [name], MD  History:  Gregory Monagle. is a 75 y.o. male here as a referral from Dr. Willey Blade for neuropathy and back pain.  Reviewed imaging of the lumbar spine with patient:  CT lumbar spine 03/03/2016:  L5-S1: Prominent left and moderate to prominent right foraminal stenosis due to disc uncovering and facet and intervertebral spurring. Not appreciably changed from prior.  IMPRESSION: 1. Stable prominent impingement at L5-S1 due to disc uncovering and facet and intervertebral spurring. Bilateral pars defects at L5 cause 10 mm of anterolisthesis. 2. Mild disc bulges at other lumbar levels, but without other impingement. 3. Sigmoid colon diverticulosis. 4. Bridging spurring of both sacroiliac joints anteriorly. 5. Aortoiliac atherosclerotic vascular disease.   XR 02/2016:  FINDINGS: Lateral neutral, lateral flexion, and lateral extension views. Lateral neutral view demonstrates maintenance of vertebral body height. Intervertebral disc height loss at L5-S1 with anterolisthesis of approximately 10 mL. Aortic atherosclerosis.  With flexion, there is limited range of motion but no change in an malalignment.  With extension, range of motion is good. The extent of L5-S1 anterolisthesis increases minimally, 12 mm.  Facet arthropathy involves L4-5 and L5-S1.  IMPRESSION: L5-S1 anterolisthesis, with increase with extension. This suggests a component of mild instability. Aortic atherosclerosis.  Summary  Nerve conduction studies were performed on the bilateral upper and lower extremities:  The bilateral Median motor nerves showed normal conductions with normal F Wave latencies The bilateral Ulnar motor nerves showed normal conductions with normal F Wave latencies The right Peroneal motor nerve showed normal conductions with normal F Wave latency The right  Tibial motor nerve showed normal conductions with normal F Wave latency The bilateral second-digit Median sensory nerves were within normal limits The bilateral fifth-digit Ulnar sensory nerves were within normal limits The bilateral Peroneal sensory nerves were within normal limits Bilateral H Reflexes showed normal latencies  EMG Needle study was performed on selected bilateral lower extremity muscles   The left biceps femoris(long head) showed increased spontaneous activity (3+ positive sharp waves), the left lower paraspinal muscles showed spontaneous activity (1+ positive sharp waves). The right paraspinal muscles were within normal limits.  The bilateral Vastus Medialis, bilateral Gluteus Medius and Maximus, right Biceps Femoris(long head), bilateral Anterior Tibialis, bilateral Medial gastrocnemius and bilateral extensor Hallucis Longus muscles were within normal limits.  Conclusion: There is acute/ongoing denervation seen in the left lumbosacral paraspinal muscles and a left sided proximal muscle with L5, S1 innervation consistent with radiculopathy.No electrophysiologic evidence for ulnar or median neuropathy, peripheral polyneuropathy, muscle disorder. Discussed with patient including his imaging above and will refer to Dr. Nelva Bush.   Sarina Ill, MD  [facility] [address] [employer] [place], [place] [address]  Phone [phone] Fax [phone]

## 2016-03-17 ENCOUNTER — Encounter: Payer: Self-pay | Admitting: Internal Medicine

## 2016-03-17 ENCOUNTER — Ambulatory Visit (INDEPENDENT_AMBULATORY_CARE_PROVIDER_SITE_OTHER): Payer: Medicare Other | Admitting: Internal Medicine

## 2016-03-17 VITALS — BP 120/78 | HR 77 | Ht 68.0 in | Wt 238.0 lb

## 2016-03-17 DIAGNOSIS — I495 Sick sinus syndrome: Secondary | ICD-10-CM

## 2016-03-17 LAB — CUP PACEART INCLINIC DEVICE CHECK
Implantable Lead Implant Date: 20090903
Implantable Lead Location: 753859
MDC IDC LEAD IMPLANT DT: 20090903
MDC IDC LEAD LOCATION: 753860
MDC IDC SESS DTM: 20170630134207
Pulse Gen Model: 5826
Pulse Gen Serial Number: 1269181

## 2016-03-17 NOTE — Progress Notes (Signed)
See procedure note.

## 2016-03-17 NOTE — Patient Instructions (Signed)
Your physician wants you to follow-up in: 1 Year with Dr. Lovena Le. You will receive a reminder letter in the mail two months in advance. If you don't receive a letter, please call our office to schedule the follow-up appointment.  Your physician recommends that you schedule a follow-up appointment in: 6 Months in the [facility].  Your physician recommends that you continue on your current medications as directed. Please refer to the Current Medication list given to you today.  If you need a refill on your cardiac medications before your next appointment, please call your pharmacy.  Thank you for choosing [facility]!

## 2016-03-17 NOTE — Progress Notes (Signed)
HPI [name] returns today for followup. He is a very pleasant 75 year old man with a history of symptomatic bradycardia, paroxysmal atrial fibrillation and persistent atrial flutter, borderline hypertension, status post permanent pacemaker insertion. The patient wears CPAP at home. He is s/p catheter ablation of atrial flutter and has maintained NSR mostly on high dose flecainide. He goes out of rhythm about 20-25% of the time. He does not feel it when he goes out of rhythm. He has had back problems. Allergies  Allergen Reactions  . Bee Venom Swelling  . Penicillins     REACTION: rash  . Shellfish Allergy Hives    Accelerated allergic reaction; questionable laryngospasm or laryngeal edema with some difficulty breathing prompting ED evaluation and treatment at [facility]     Current Outpatient Prescriptions  Medication Sig Dispense Refill  . Calcium Carbonate-Vitamin D (CALCIUM-VITAMIN D3) 600-125 MG-UNIT TABS Take 1,200 mg by mouth 2 (two) times daily.     . Cholecalciferol (VITAMIN D-3 PO) Take 2,000 Units by mouth daily.    Marland Kitchen diltiazem (TIAZAC) 120 MG 24 hr capsule Take 120 mg by mouth every morning.     . diltiazem (TIAZAC) 300 MG 24 hr capsule Take 300 mg by mouth every evening.     . docusate sodium (COLACE) 100 MG capsule Take 100 mg by mouth 2 (two) times daily as needed for mild constipation.    . flecainide (TAMBOCOR) 100 MG tablet Take 1.5 tablets (150 mg total) by mouth 2 (two) times daily. 90 tablet 6  . gabapentin (NEURONTIN) 800 MG tablet Take 800 mg by mouth 2 (two) times daily. 300mg  in the morning and 600mg  in the evening.    . hydrocortisone (ANUSOL-HC) 2.5 % rectal cream Place 1 application rectally 2 (two) times daily as needed for hemorrhoids or itching.    . metoprolol tartrate (LOPRESSOR) 25 MG tablet Take 0.5 tablets (12.5 mg total) by mouth 2 (two) times daily. 90 tablet 3  . morphine (MS CONTIN) 15 MG 12 hr tablet Take 15 mg by mouth every 12 (twelve) hours.    .  pantoprazole (PROTONIX) 40 MG tablet Take 40 mg by mouth daily.    . polyethylene glycol (MIRALAX / GLYCOLAX) packet Take 17 g by mouth at bedtime.     . psyllium (METAMUCIL) 58.6 % powder Take 1 packet by mouth 1 day or 1 dose.    . rivaroxaban (XARELTO) 20 MG TABS tablet Take 1 tablet (20 mg total) by mouth daily with supper. 90 tablet 3  . Tamsulosin HCl (FLOMAX) 0.4 MG CAPS Take 0.4 mg by mouth daily.     . traMADol (ULTRAM) 50 MG tablet Take 1-2 tablet (50-100 mg total) by mouth every 6 (six) hours as needed. Maximum 400mg  a day. 90 tablet 4   No current facility-administered medications for this visit.     Past Medical History  Diagnosis Date  . General weakness     +malaise, exercise intolerance, and near syncope  . Atrial flutter ([place])     a. s/p ablation 01/2015.  Marland Kitchen Hypertension   . BPH (benign prostatic hyperplasia)   . Cerebrovascular disease     carotid bruit; no focal stenosis by carotid ultrasound in 05/2008  . Low back pain     history of traumax2  . Tachy-brady syndrome ([place])     a. s/p St. Jude pacemaker 2009.  Marland Kitchen Paroxysmal atrial fibrillation ([facility])   . Sleep apnea     c pap at night  . Dysrhythmia   .  Presence of permanent cardiac pacemaker 2009    ROS:   All systems reviewed and negative except as noted in the HPI.   Past Surgical History  Procedure Laterality Date  . Cervical discectomy  2006  . Cataract extraction      Bilateral  . Electrophysiologic study N/A 01/18/2015    Procedure: A-Flutter/A-Tach/Svt Ablation;  Surgeon: [name], MD;  Location: [facility];  Service: Cardiovascular;  Laterality: N/A;  . Ablation    . Knee arthroscopy with medial menisectomy Left 06/03/2015    Procedure: KNEE ARTHROSCOPY WITH MEDIAL MENISECTOMY;  Surgeon: [name], MD;  Location: [facility];  Service: Orthopedics;  Laterality: Left;  . Pacemaker insertion  1966     Family History  Problem Relation Age of Onset  . Hypertension    .  Neuropathy Neg Hx   . Stroke Neg Hx      Social History   Social History  . Marital Status: Divorced    Spouse Name: N/A  . Number of Children: 3  . Years of Education: 12+   Occupational History  . Retired    Social History Main Topics  . Smoking status: Former Smoker -- 1.00 packs/day for .5 years    Types: Cigarettes    Quit date: 03/27/1962  . Smokeless tobacco: Never Used  . Alcohol Use: No  . Drug Use: No  . Sexual Activity: Not on file   Other Topics Concern  . Not on file   Social History Narrative   Divorced with 3 adult children. Semi retired.    Caffeine use: once daily        BP 120/78 mmHg  Pulse 77  Ht 5\' 8"  (1.727 m)  Wt 238 lb (107.956 kg)  BMI 36.20 kg/m2  SpO2 95%  Physical Exam:  Well appearing 75 year old man,NAD HEENT: Unremarkable Neck:  6 cm JVD, no thyromegally Lungs:  Clear, with no wheezes, rales, or rhonchi. HEART:  Regular rate rhythm, no murmurs, no rubs, no clicks Abd:  soft, obese,positive bowel sounds, no organomegally, no rebound, no guarding Ext:  2 plus pulses, no edema, no cyanosis, no clubbing Skin:  No rashes no nodules Neuro:  CN II through XII intact, motor grossly intact  ECG - NSR with AV pacing  DEVICE  Normal device function.  See PaceArt for details.   Assess/Plan: 1. Atrial fib - he is having some breakthrough atrial fib and has increased his flecainide to 150 bid. He appears to be tolerating the high dose of flecainide. 2. Atrial flutter - he has had no recurrent typical flutter 3. HTN - we discussed stopping his metoprolol but I would like him to stay on both the beta blocker and calcium blocker 4. Sinus node dysfunction - he is s/p PPM. His St. Jude PPM is working normally and he is asymptomatic.  Mikle Bosworth.D.

## 2016-03-20 NOTE — Procedures (Signed)
[facility]    Provider:  Dr Jaynee Eagles Referring Provider: [name], MD Primary Care Physician:  [name], MD  History:  [name]. is a 75 y.o. male here as a referral from Dr. Willey Blade for neuropathy and back pain.  Reviewed imaging of the lumbar spine with patient:  CT lumbar spine 03/03/2016:  L5-S1: Prominent left and moderate to prominent right foraminal stenosis due to disc uncovering and facet and intervertebral spurring. Not appreciably changed from prior.  IMPRESSION: 1. Stable prominent impingement at L5-S1 due to disc uncovering and facet and intervertebral spurring. Bilateral pars defects at L5 cause 10 mm of anterolisthesis. 2. Mild disc bulges at other lumbar levels, but without other impingement. 3. Sigmoid colon diverticulosis. 4. Bridging spurring of both sacroiliac joints anteriorly. 5. Aortoiliac atherosclerotic vascular disease.   XR 02/2016:  FINDINGS: Lateral neutral, lateral flexion, and lateral extension views. Lateral neutral view demonstrates maintenance of vertebral body height. Intervertebral disc height loss at L5-S1 with anterolisthesis of approximately 10 mL. Aortic atherosclerosis.  With flexion, there is limited range of motion but no change in an malalignment.  With extension, range of motion is good. The extent of L5-S1 anterolisthesis increases minimally, 12 mm.  Facet arthropathy involves L4-5 and L5-S1.  IMPRESSION: L5-S1 anterolisthesis, with increase with extension. This suggests a component of mild instability. Aortic atherosclerosis.  Summary  Nerve conduction studies were performed on the bilateral upper and lower extremities:  The bilateral Median motor nerves showed normal conductions with normal F Wave latencies The bilateral Ulnar motor nerves showed normal conductions with normal F Wave latencies The right Peroneal motor nerve showed normal conductions with normal F Wave latency The right  Tibial motor nerve showed normal conductions with normal F Wave latency The bilateral second-digit Median sensory nerves were within normal limits The bilateral fifth-digit Ulnar sensory nerves were within normal limits The bilateral Peroneal sensory nerves were within normal limits Bilateral H Reflexes showed normal latencies  EMG Needle study was performed on selected bilateral lower extremity muscles   The left biceps femoris(long head) showed increased spontaneous activity (3+ positive sharp waves), the left lower paraspinal muscles showed spontaneous activity (1+ positive sharp waves). The right paraspinal muscles were within normal limits.  The bilateral Vastus Medialis, bilateral Gluteus Medius and Maximus, right Biceps Femoris(long head), bilateral Anterior Tibialis, bilateral Medial gastrocnemius and bilateral extensor Hallucis Longus muscles were within normal limits.  Conclusion: There is acute/ongoing denervation seen in the left lumbosacral paraspinal muscles and a left sided proximal muscle with L5, S1 innervation consistent with radiculopathy.No electrophysiologic evidence for ulnar or median neuropathy, peripheral polyneuropathy, muscle disorder. Discussed with patient including his imaging above and will refer to Dr. Nelva Bush.   Sarina Ill, MD  [facility] [address] [place], [place] [address]

## 2016-04-17 ENCOUNTER — Telehealth: Payer: Self-pay | Admitting: Internal Medicine

## 2016-04-17 DIAGNOSIS — I10 Essential (primary) hypertension: Secondary | ICD-10-CM | POA: Diagnosis not present

## 2016-04-17 DIAGNOSIS — M545 Low back pain: Secondary | ICD-10-CM | POA: Diagnosis not present

## 2016-04-17 DIAGNOSIS — I480 Paroxysmal atrial fibrillation: Secondary | ICD-10-CM | POA: Diagnosis not present

## 2016-04-17 NOTE — Telephone Encounter (Signed)
Patient states that he has had several episodes of rapid heart rate at 6am. / tg

## 2016-04-17 NOTE — Telephone Encounter (Signed)
Pt has pacer but is not enrolled in remote check.I will give him number to device clinic in [place]

## 2016-04-18 ENCOUNTER — Telehealth: Payer: Self-pay | Admitting: Cardiology

## 2016-04-18 NOTE — Telephone Encounter (Signed)
Pt called and stated that he has had a rapid heart beat about 3-4 times a week for about 5-6 weeks. He stated that when lays in bed for several minutes it will go away. Pt stated that he has some SOB and dizziness but nothing out of the normal daily symptom. Informed pt that his PPM is not compatible w/ remote monitoring.

## 2016-04-18 NOTE — Telephone Encounter (Signed)
[name] is complaining of being out of rhythm (history of PAF, on Xarelto, diltiazem, metoprolol) and feeling his heart race intermittently. He feels as if his dizziness is related to taking his medications on an empty stomach in the morning and so he has tried to start eating breakfast with his meds. Mr. Borth has not been checking his heart rates when he is symptomatic. He says his shortness of breath is no more than usual, not in distress over the phone. He had his PPM checked 03/17/16 revealing 24% AT/AF, ventricular rates were controlled. His device does not provide EGMs due to programming.  I will route this note to Janan Halter, RN as this is not a PPM related issue. If it is felt he needs his PPM interrogated, there are openings on device clinic Wed and Thursday this week.

## 2016-04-20 NOTE — Telephone Encounter (Signed)
Spoke with patient and he says he is guilty of taking his medications in the morning on an empty stomach and feels this is most of his problem.  He has started trying to eat first and then take meds.  He will call back if he feels he needs to come in to be evaluated.  He appreciated my call back.

## 2016-04-24 ENCOUNTER — Other Ambulatory Visit: Payer: Self-pay

## 2016-04-24 MED ORDER — FLECAINIDE ACETATE 100 MG PO TABS: 150.0000 mg | ORAL_TABLET | Freq: Two times a day (BID) | ORAL | 6 refills | Status: DC

## 2016-04-26 ENCOUNTER — Other Ambulatory Visit: Payer: Self-pay

## 2016-04-26 MED ORDER — FLECAINIDE ACETATE 100 MG PO TABS: 150.0000 mg | ORAL_TABLET | Freq: Two times a day (BID) | ORAL | 3 refills | Status: DC

## 2016-05-07 IMAGING — CT CT KNEE*L* W/CM
2 of 5 series · 15 of 33 positions shown, 19 images · IV contrast (agent unspecified)
Comparison: None.

CLINICAL DATA: LEFT knee pain.  No injury.  Initial encounter.

EXAM:
CT OF THE LEFT KNEE WITH CONTRAST
TECHNIQUE: Multidetector CT imaging was performed following the standard
protocol during bolus administration of intravenous contrast.

[Series 5: knee pacs 2.0 st · axial · 0.38mm/px · z∈[-196,-60]mm · 10 of 82 slices shown, 13 images]
[im 7/82  soft-tissue]
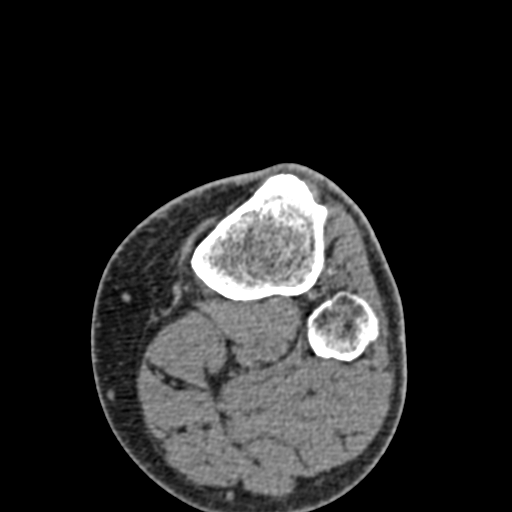
[im 7/82  bone]
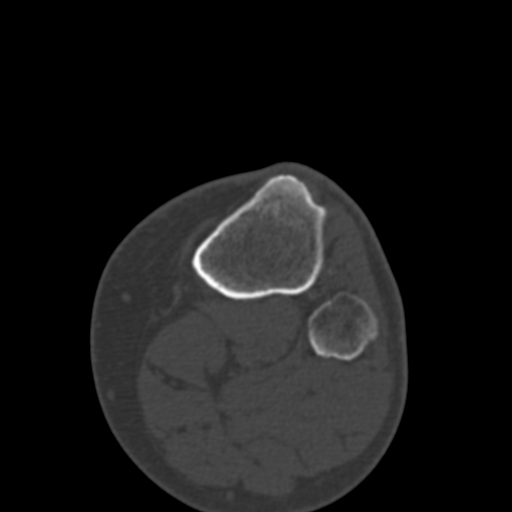
[im 13/82  bone]
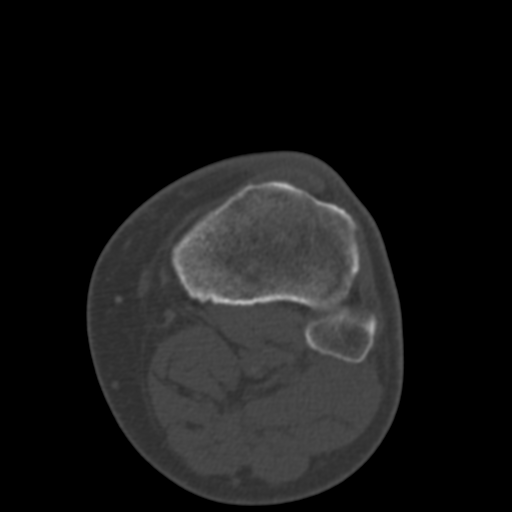
[im 25/82  bone]
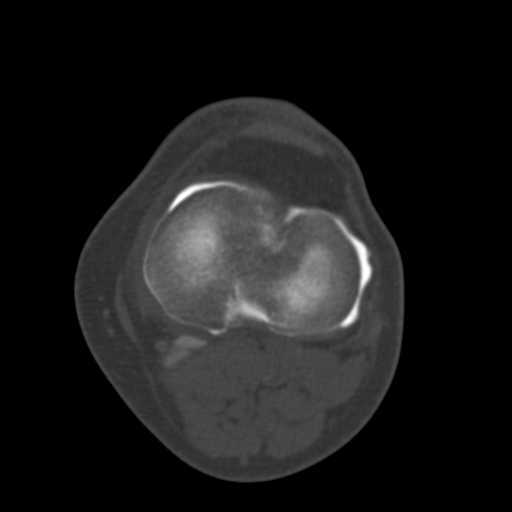
[im 32/82  bone]
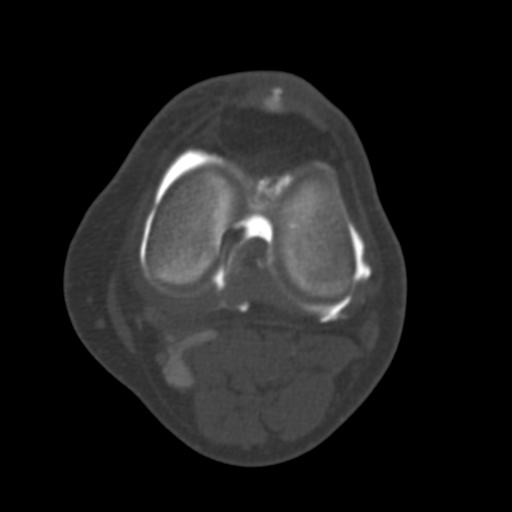
[im 38/82  soft-tissue]
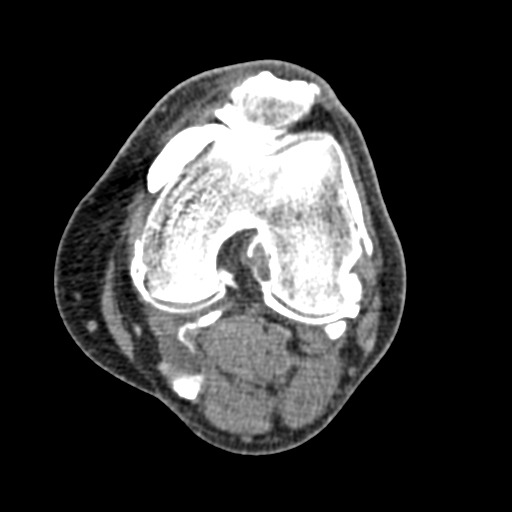
[im 38/82  bone]
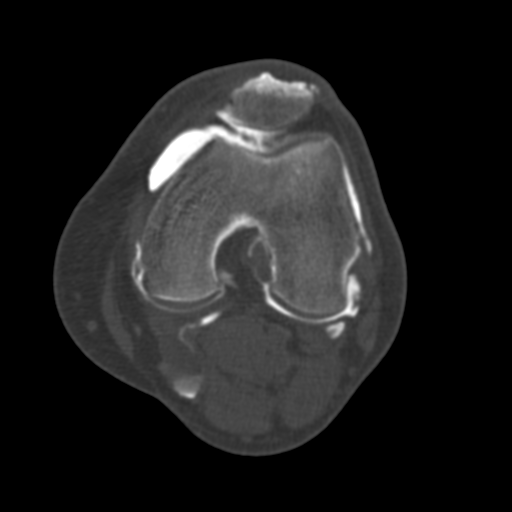
[im 44/82  bone]
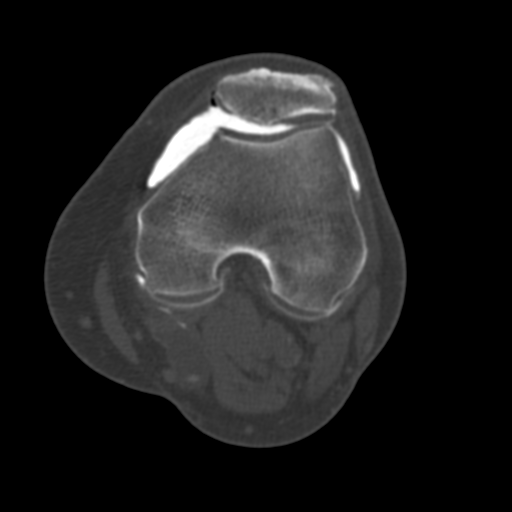
[im 50/82  bone]
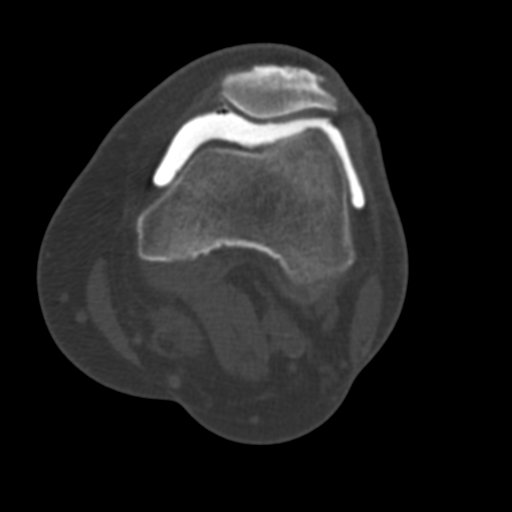
[im 63/82  bone]
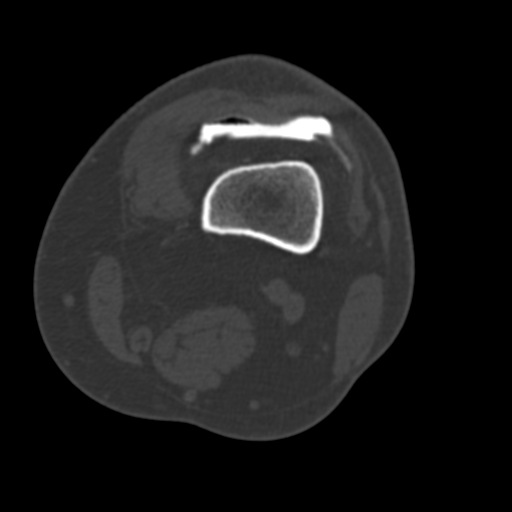
[im 69/82  soft-tissue]
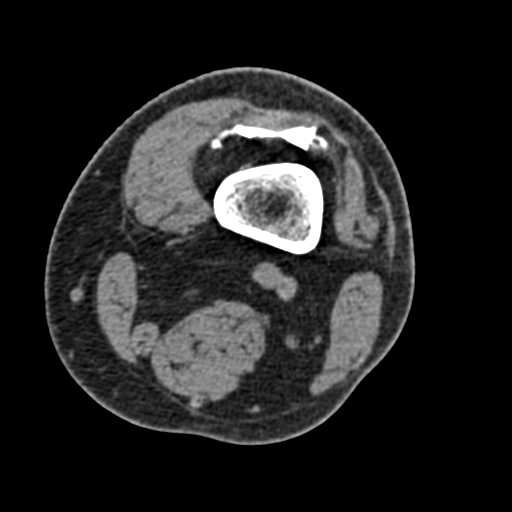
[im 69/82  bone]
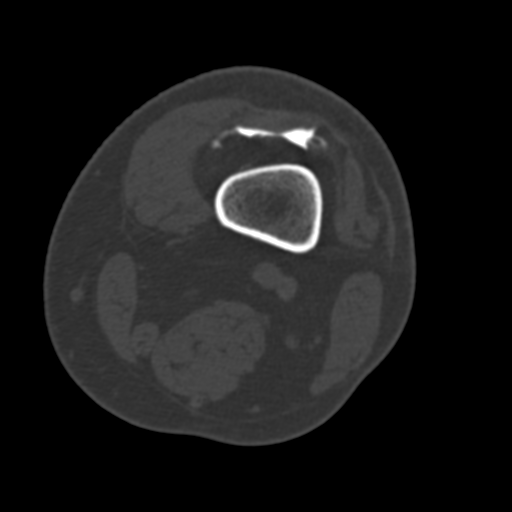
[im 75/82  bone]
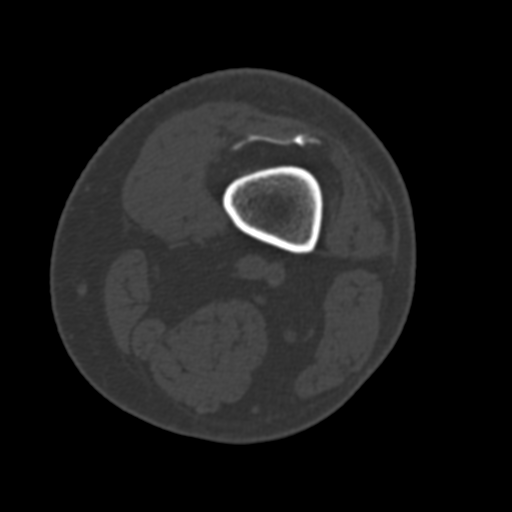

[Series 7: knee pacs 2.0 st sag · sagittal · 0.33mm/px · 5 of 79 slices shown, 6 images]
[im 27/79  bone]
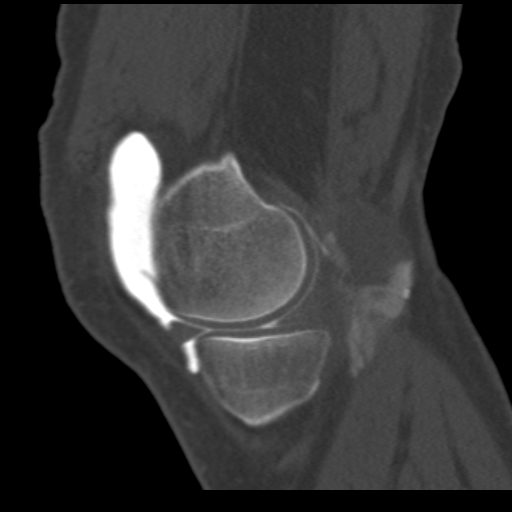
[im 33/79  bone]
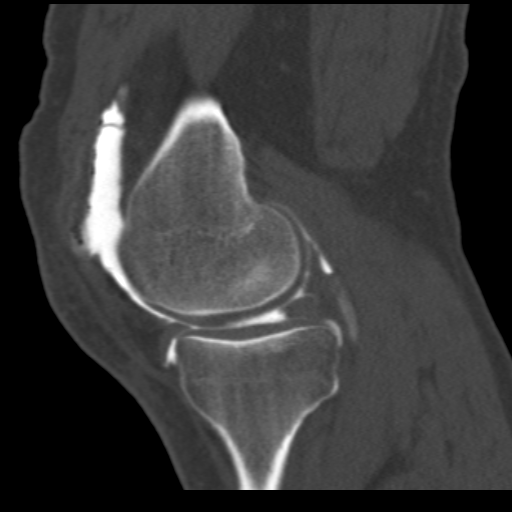
[im 40/79  soft-tissue]
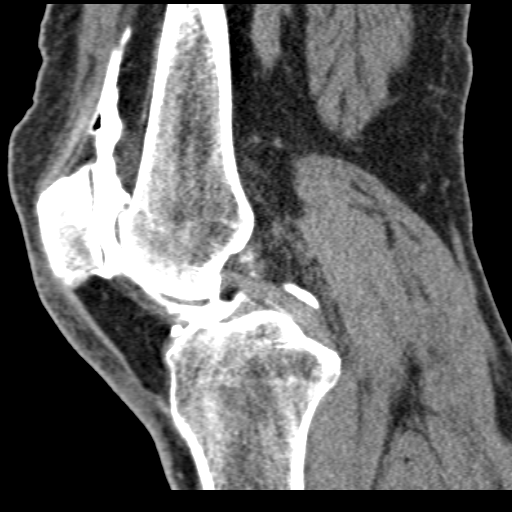
[im 40/79  bone]
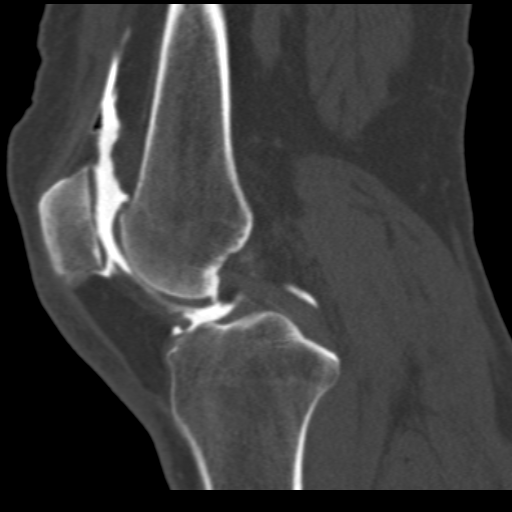
[im 46/79  bone]
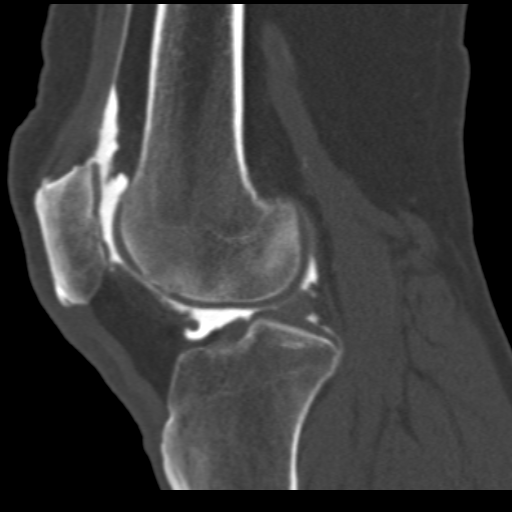
[im 53/79  bone]
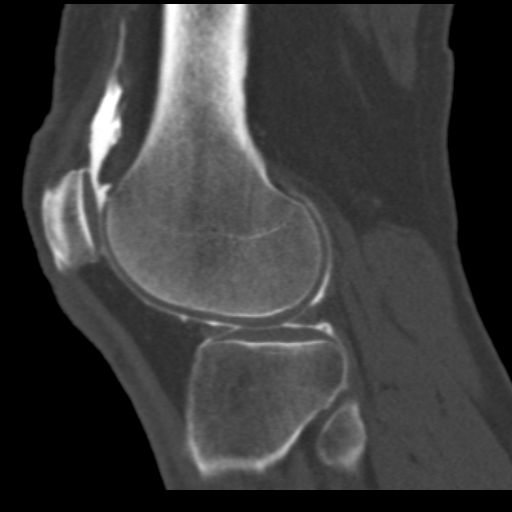

[15 of 33 positions shown; findings below may reference images not displayed]

FINDINGS: There is a horizontal tear involving the free edge of the posterior
horn of the medial meniscus. At the junction of the posterior body
and posterior horn, this becomes complex, with a radial component
and probably Otavio Lucien component. On axial imaging the radial
cleavage plane can be seen (image 54 series 2).

Medial compartment osteoarthritis is mild with grade II
chondromalacia of the medial weight-bearing medial femoral condyle.

ACL and PCL grossly appear intact. Collateral ligaments grossly
appear intact. The extensor mechanism appears normal. Moderate
patellofemoral osteoarthritis is present with diffuse grade III
apical and periapical chondromalacia (image 39 series 2).
Complementary grade III knee trochlear chondromalacia is present
(image 41 series 3). There are no destructive osseous lesions. No
fracture.

The alignment of the LEFT knee is anatomic. The lateral compartment
cartilage is normal. Lateral meniscus appears normal.
IMPRESSION: 1. Complex tear of the body and posterior horn of the medial
meniscus.
2. Moderate patellofemoral compartment osteoarthritis and mild
medial compartment osteoarthritis.

## 2016-05-23 ENCOUNTER — Telehealth: Payer: Self-pay | Admitting: Internal Medicine

## 2016-05-23 NOTE — Telephone Encounter (Signed)
Spoke to pt. He is concerned about the model of his pacemaker as someone hacks into them and he is afraid he has one of the models that was mentioned in a television commercial. I advised pt. To call the [employer] office as they know more about the devices that we do here. Pt was understanding, and stated he will call [facility]  To see if they could assist him.

## 2016-05-26 ENCOUNTER — Telehealth: Payer: Self-pay | Admitting: Internal Medicine

## 2016-05-26 MED ORDER — FLECAINIDE ACETATE 100 MG PO TABS
150.0000 mg | ORAL_TABLET | Freq: Two times a day (BID) | ORAL | 3 refills | Status: DC
Start: 2016-05-26 — End: 2019-03-11

## 2016-05-26 NOTE — Telephone Encounter (Signed)
Patient is requesting a written 90 day RX for Flecanide so that he can take to [place] due to it being cheaper to get it from there. / tg

## 2016-06-09 ENCOUNTER — Encounter: Payer: Self-pay | Admitting: Internal Medicine

## 2016-06-09 ENCOUNTER — Ambulatory Visit (INDEPENDENT_AMBULATORY_CARE_PROVIDER_SITE_OTHER): Payer: Medicare Other | Admitting: *Deleted

## 2016-06-09 DIAGNOSIS — I495 Sick sinus syndrome: Secondary | ICD-10-CM | POA: Diagnosis not present

## 2016-06-09 DIAGNOSIS — I480 Paroxysmal atrial fibrillation: Secondary | ICD-10-CM | POA: Diagnosis not present

## 2016-06-09 LAB — CUP PACEART INCLINIC DEVICE CHECK
Battery Impedance: 2400 Ohm
Battery Voltage: 2.75 V
Brady Statistic RA Percent Paced: 99 %
Brady Statistic RV Percent Paced: 99 %
Date Time Interrogation Session: 20170922165036
Implantable Lead Implant Date: 20090903
Implantable Lead Implant Date: 20090903
Implantable Lead Location: 753859
Implantable Lead Location: 753860
Lead Channel Impedance Value: 373 Ohm
Lead Channel Impedance Value: 489 Ohm
Lead Channel Pacing Threshold Amplitude: 0.75 V
Lead Channel Pacing Threshold Pulse Width: 0.5 ms
Lead Channel Sensing Intrinsic Amplitude: 1.2 mV
Lead Channel Sensing Intrinsic Amplitude: 8.8 mV
Lead Channel Setting Pacing Amplitude: 2 V
Lead Channel Setting Pacing Amplitude: 2.5 V
Lead Channel Setting Pacing Pulse Width: 0.5 ms
Lead Channel Setting Sensing Sensitivity: 2 mV
Pulse Gen Model: 5826
Pulse Gen Serial Number: 1269181

## 2016-06-09 NOTE — Progress Notes (Signed)
Pacemaker check in clinic. Normal device function. Threshold, sensing, impedances consistent with previous measurements. Device programmed to maximize longevity. 18% AT/AF burden, presently in AFL + xarelto. Device programmed at appropriate safety margins. Histogram distribution appropriate for patient activity level. Device programmed to optimize intrinsic conduction. Estimated longevity 2.75-3.5 years. Patient will follow up with the Device Clinic/R in 6 months.

## 2016-06-20 ENCOUNTER — Ambulatory Visit (INDEPENDENT_AMBULATORY_CARE_PROVIDER_SITE_OTHER): Payer: Medicare Other | Admitting: Urology

## 2016-06-20 DIAGNOSIS — N401 Enlarged prostate with lower urinary tract symptoms: Secondary | ICD-10-CM | POA: Diagnosis not present

## 2016-07-31 DIAGNOSIS — I480 Paroxysmal atrial fibrillation: Secondary | ICD-10-CM | POA: Diagnosis not present

## 2016-07-31 DIAGNOSIS — I10 Essential (primary) hypertension: Secondary | ICD-10-CM | POA: Diagnosis not present

## 2016-08-04 ENCOUNTER — Telehealth: Payer: Self-pay | Admitting: Internal Medicine

## 2016-08-04 NOTE — Telephone Encounter (Addendum)
Pt cannot afford $400 at [facility] for Xarelto.Through the [place], they  can give him Eliquis for a much cheaper price.Is this ok with you ?   Pt had labs done by [facility] and was told he early renal insufficiency and they stopped Xarelto. He was placed on Eliquis 5 mg BID and is awaiting shipment.I spoke with Kerin Ransom PA-C, and he states we may give pt Eliquis samples until his med arrives. Pt states we are going to get results of labs from [place]

## 2016-08-04 NOTE — Telephone Encounter (Signed)
Patient has questions regarding medications. / tg  °

## 2016-08-09 MED ORDER — APIXABAN 5 MG PO TABS: 5.0000 mg | ORAL_TABLET | Freq: Two times a day (BID) | ORAL | 1 refills | Status: AC

## 2016-08-09 NOTE — Addendum Note (Signed)
Addended by: Barbarann Ehlers A on: 08/09/2016 04:19 PM   Modules accepted: Orders

## 2016-09-03 ENCOUNTER — Emergency Department (HOSPITAL_COMMUNITY): Payer: Medicare Other

## 2016-09-03 ENCOUNTER — Encounter (HOSPITAL_COMMUNITY): Payer: Self-pay | Admitting: Emergency Medicine

## 2016-09-03 ENCOUNTER — Emergency Department (HOSPITAL_COMMUNITY)
Admission: EM | Admit: 2016-09-03 | Discharge: 2016-09-03 | Disposition: A | Discharge status: Home / Self Care | Payer: Medicare Other | Attending: Emergency Medicine | Admitting: Emergency Medicine

## 2016-09-03 DIAGNOSIS — R072 Precordial pain: Secondary | ICD-10-CM | POA: Insufficient documentation

## 2016-09-03 DIAGNOSIS — Z87891 Personal history of nicotine dependence: Secondary | ICD-10-CM | POA: Diagnosis not present

## 2016-09-03 DIAGNOSIS — R61 Generalized hyperhidrosis: Secondary | ICD-10-CM | POA: Insufficient documentation

## 2016-09-03 DIAGNOSIS — M542 Cervicalgia: Secondary | ICD-10-CM | POA: Diagnosis not present

## 2016-09-03 DIAGNOSIS — Z79899 Other long term (current) drug therapy: Secondary | ICD-10-CM | POA: Insufficient documentation

## 2016-09-03 DIAGNOSIS — Z8719 Personal history of other diseases of the digestive system: Secondary | ICD-10-CM | POA: Diagnosis not present

## 2016-09-03 DIAGNOSIS — I10 Essential (primary) hypertension: Secondary | ICD-10-CM | POA: Diagnosis not present

## 2016-09-03 DIAGNOSIS — R079 Chest pain, unspecified: Secondary | ICD-10-CM | POA: Diagnosis not present

## 2016-09-03 LAB — CBC
HEMATOCRIT: 40.5 % (ref 39.0–52.0)
HEMOGLOBIN: 13.4 g/dL (ref 13.0–17.0)
MCH: 31.8 pg (ref 26.0–34.0)
MCHC: 33.1 g/dL (ref 30.0–36.0)
MCV: 96 fL (ref 78.0–100.0)
Platelets: 173 10*3/uL (ref 150–400)
RBC: 4.22 MIL/uL (ref 4.22–5.81)
RDW: 13.4 % (ref 11.5–15.5)
WBC: 9 10*3/uL (ref 4.0–10.5)

## 2016-09-03 LAB — BASIC METABOLIC PANEL
ANION GAP: 8 (ref 5–15)
BUN: 15 mg/dL (ref 6–20)
CO2: 28 mmol/L (ref 22–32)
Calcium: 9.5 mg/dL (ref 8.9–10.3)
Chloride: 100 mmol/L — ABNORMAL LOW (ref 101–111)
Creatinine, Ser: 1.4 mg/dL — ABNORMAL HIGH (ref 0.61–1.24)
GFR, EST AFRICAN AMERICAN: 55 mL/min — AB (ref >60)
GFR, EST NON AFRICAN AMERICAN: 48 mL/min — AB (ref >60)
GLUCOSE: 118 mg/dL — AB (ref 65–99)
POTASSIUM: 3.9 mmol/L (ref 3.5–5.1)
Sodium: 136 mmol/L (ref 135–145)

## 2016-09-03 LAB — TROPONIN I

## 2016-09-03 MED ORDER — FAMOTIDINE 20 MG PO TABS
20.0000 mg | ORAL_TABLET | Freq: Once | ORAL | Status: AC
  Administered 2016-09-03: 20 mg via ORAL
  Filled 2016-09-03: qty 1

## 2016-09-03 MED ORDER — ACETAMINOPHEN 325 MG PO TABS
650.0000 mg | ORAL_TABLET | Freq: Once | ORAL | Status: AC
  Administered 2016-09-03: 650 mg via ORAL
  Filled 2016-09-03: qty 2

## 2016-09-03 MED ORDER — GI COCKTAIL ~~LOC~~
30.0000 mL | Freq: Once | ORAL | Status: AC
  Administered 2016-09-03: 30 mL via ORAL
  Filled 2016-09-03: qty 30

## 2016-09-03 NOTE — ED Triage Notes (Signed)
Pt reports waking up this morning and beginning to have chest pain.  States this pain is different than usual as it is dull and not sharp.  States both arms ache usually due to arthritis and they are currently hurting.  Reports shortness of breath, but "no worse than normal."  Took 2 Tramadol with no relief this morning.

## 2016-09-03 NOTE — Discharge Instructions (Signed)
It was our pleasure to provide your ER care today - we hope that you feel better.  Continue taking your acid blocker medication.    Follow up with your doctor in the next 1-2 weeks.  For chest discomfort, follow up with cardiologist in the coming week - see referral - call office Monday to arrange appointment.  Return to ER if worse, recurrent or persistent chest pain, trouble breathing, other concern.

## 2016-09-03 NOTE — ED Provider Notes (Signed)
[facility] DEPT Provider Note   CSN: [id number] Arrival date & time: 09/03/16  [id number] By signing my name below, I, [name], attest that this documentation has been prepared under the direction and in the presence of [name], MD. Electronically Signed: [name], ED Scribe. 09/03/16. 9:22 AM.  History   Chief Complaint Chief Complaint  Patient presents with  . Chest Pain   The history is provided by the patient. No language interpreter was used.   HPI Comments: [name]. is a 75 y.o. male who presents to the Emergency Department with a PMHx HTN complaining of central chest pain with associated diaphoresis that began 4 hours ago. Pt also reports chronic bilateral arm aches and chronic neck pain. Pt states his chest pain has improved since its onset. Pt had similar chest pain related to indigestion. Pt denies aggravation with exertion. Pt took Prilosec this morning with no relief. Pt denies any fevers, chills, SOB, cough, sore throat, nausea, vomitingor any other symptoms at this time. Pt denies hx of cardiac catheterization but has a hx of a cardiac ablation. Pt recently taken off of xarelto and placed on eliquis.   Pt is followed by Dr. Crissie Sickles, Cardiology  Past Medical History:  Diagnosis Date  . Atrial flutter ([place])    a. s/p ablation 01/2015.  Marland Kitchen BPH (benign prostatic hyperplasia)   . Cerebrovascular disease    carotid bruit; no focal stenosis by carotid ultrasound in 05/2008  . Dysrhythmia   . General weakness    +malaise, exercise intolerance, and near syncope  . Hypertension   . Low back pain    history of traumax2  . Paroxysmal atrial fibrillation (HCC)   . Presence of permanent cardiac pacemaker 2009  . Sleep apnea    c pap at night  . Tachy-brady syndrome ([place])    a. s/p St. Jude pacemaker 2009.    Patient Active Problem List   Diagnosis Date Noted  . Paroxysmal atrial fibrillation (HCC)   . Fatigue 11/18/2015  . Chronic  anticoagulation 11/18/2015  . Atrial fibrillation ([place]) 03/15/2015  . Dyspnea 12/25/2014  . OSA (obstructive sleep apnea) 09/02/2013  . Atrial flutter ([place]) 11/28/2012  . Hypertension 11/21/2011  . Sick sinus syndrome ([place]) 06/13/2011  . HYPERLIPIDEMIA 02/17/2010  . ORTHOSTATIC DIZZINESS 02/17/2010  . CHEST PAIN 02/17/2010  . PPM-St.Jude 06/16/2009  . LOW BACK PAIN, CHRONIC 07/31/2008  . Cerebrovascular disease 07/31/2008  . BENIGN PROSTATIC HYPERTROPHY, MILD, HX OF 07/31/2008    Past Surgical History:  Procedure Laterality Date  . ABLATION    . CATARACT EXTRACTION     Bilateral  . CERVICAL DISCECTOMY  2006  . ELECTROPHYSIOLOGIC STUDY N/A 01/18/2015   Procedure: A-Flutter/A-Tach/Svt Ablation;  Surgeon: [name], MD;  Location: [facility] IN[facility]ASIVE CV [facility] CUPID;  Service: Cardiovascular;  Laterality: N/A;  . KNEE ARTHROSCOPY WITH MEDIAL MENISECTOMY Left 06/03/2015   Procedure: KNEE ARTHROSCOPY WITH MEDIAL MENISECTOMY;  Surgeon: [name], MD;  Location: [facility];  Service: Orthopedics;  Laterality: Left;  . PACEMAKER INSERTION  1966       Home Medications    Prior to Admission medications   Medication Sig Start Date End Date Taking? Authorizing Provider  apixaban (ELIQUIS) 5 MG TABS tablet Take 1 tablet (5 mg total) by mouth 2 (two) times daily. 08/09/16   Erlene Quan, PA-C  Calcium Carbonate-Vitamin D (CALCIUM-VITAMIN D3) 600-125 MG-UNIT TABS Take 1,200 mg by mouth 2 (two) times daily.     Historical Provider,  MD  Cholecalciferol (VITAMIN D-3 PO) Take 2,000 Units by mouth daily.    Historical Provider, MD  diltiazem (TIAZAC) 120 MG 24 hr capsule Take 120 mg by mouth every morning.     Historical Provider, MD  diltiazem (TIAZAC) 300 MG 24 hr capsule Take 300 mg by mouth every evening.     Historical Provider, MD  docusate sodium (COLACE) 100 MG capsule Take 100 mg by mouth 2 (two) times daily as needed for mild constipation.    Historical Provider, MD  flecainide (TAMBOCOR) 100  MG tablet Take 1.5 tablets (150 mg total) by mouth 2 (two) times daily. 05/26/16   [name], MD  gabapentin (NEURONTIN) 800 MG tablet Take 800 mg by mouth 2 (two) times daily. 300mg  in the morning and 600mg  in the evening.    Historical Provider, MD  hydrocortisone (ANUSOL-HC) 2.5 % rectal cream Place 1 application rectally 2 (two) times daily as needed for hemorrhoids or itching.    Historical Provider, MD  metoprolol tartrate (LOPRESSOR) 25 MG tablet Take 0.5 tablets (12.5 mg total) by mouth 2 (two) times daily. 01/01/15   [name], MD  morphine (MS CONTIN) 15 MG 12 hr tablet Take 15 mg by mouth every 12 (twelve) hours.    Historical Provider, MD  pantoprazole (PROTONIX) 40 MG tablet Take 40 mg by mouth daily.    Historical Provider, MD  polyethylene glycol (MIRALAX / GLYCOLAX) packet Take 17 g by mouth at bedtime.     Historical Provider, MD  psyllium (METAMUCIL) 58.6 % powder Take 1 packet by mouth 1 day or 1 dose.    Historical Provider, MD  Tamsulosin HCl (FLOMAX) 0.4 MG CAPS Take 0.4 mg by mouth daily.     Historical Provider, MD  traMADol (ULTRAM) 50 MG tablet Take 1-2 tablet (50-100 mg total) by mouth every 6 (six) hours as needed. Maximum 400mg  a day. 03/15/16   Melvenia Beam, MD    Family History Family History  Problem Relation Age of Onset  . Hypertension    . Neuropathy Neg Hx   . Stroke Neg Hx     Social History Social History  Substance Use Topics  . Smoking status: Former Smoker    Packs/day: 1.00    Years: 0.50    Types: Cigarettes    Quit date: 03/27/1962  . Smokeless tobacco: Never Used  . Alcohol use No     Allergies   Bee venom; Penicillins; and Shellfish allergy   Review of Systems Review of Systems  Constitutional: Positive for diaphoresis. Negative for chills and fever.  HENT: Negative for sore throat.   Respiratory: Negative for cough and shortness of breath.   Cardiovascular: Positive for chest pain.  Gastrointestinal: Negative for nausea  and vomiting.  Musculoskeletal: Positive for myalgias and neck pain.  All other systems reviewed and are negative.  Physical Exam Updated Vital Signs BP 122/68 (BP Location: Left Arm)   Pulse 75   Temp 98 F (36.7 C) (Oral)   Resp 19   Ht 5\' 8"  (1.727 m)   Wt 235 lb (106.6 kg)   SpO2 94%   BMI 35.73 kg/m   Physical Exam  Constitutional: He is oriented to person, place, and time. He appears well-developed and well-nourished.  HENT:  Head: Normocephalic and atraumatic.  Eyes: EOM are normal.  Neck: Normal range of motion.  Cardiovascular: Normal rate, regular rhythm, normal heart sounds and intact distal pulses.   Pulmonary/Chest: Effort normal and breath sounds  normal. No respiratory distress.  Abdominal: Soft. He exhibits no distension. There is no tenderness.  Musculoskeletal: Normal range of motion.  Neurological: He is alert and oriented to person, place, and time.  Skin: Skin is warm and dry.  Psychiatric: He has a normal mood and affect. Judgment normal.  Nursing note and vitals reviewed.  ED Treatments / Results  DIAGNOSTIC STUDIES: Oxygen Saturation is 94% on room air, normal by my interpretation.    COORDINATION OF CARE: 9:22 AM Discussed treatment plan with pt at bedside including laboratory studies and CXR. Pt agreed to plan.  Labs (all labs ordered are listed, but only abnormal results are displayed) Results for orders placed or performed during the hospital encounter of [id number]  Basic metabolic panel  Result Value Ref Range   Sodium 136 135 - 145 mmol/L   Potassium 3.9 3.5 - 5.1 mmol/L   Chloride 100 (L) 101 - 111 mmol/L   CO2 28 22 - 32 mmol/L   Glucose, Bld 118 (H) 65 - 99 mg/dL   BUN 15 6 - 20 mg/dL   Creatinine, Ser 1.40 (H) 0.61 - 1.24 mg/dL   Calcium 9.5 8.9 - 10.3 mg/dL   GFR calc non Af Amer 48 (L) >60 mL/min   GFR calc Af Amer 55 (L) >60 mL/min   Anion gap 8 5 - 15  CBC  Result Value Ref Range   WBC 9.0 4.0 - 10.5 K/uL   RBC 4.22 4.22 -  5.81 MIL/uL   Hemoglobin 13.4 13.0 - 17.0 g/dL   HCT 40.5 39.0 - 52.0 %   MCV 96.0 78.0 - 100.0 fL   MCH 31.8 26.0 - 34.0 pg   MCHC 33.1 30.0 - 36.0 g/dL   RDW 13.4 11.5 - 15.5 %   Platelets 173 150 - 400 K/uL  Troponin I  Result Value Ref Range   Troponin I <0.03 <0.03 ng/mL  Troponin I  Result Value Ref Range   Troponin I <0.03 <0.03 ng/mL   Dg Chest 2 View  Result Date: 09/03/2016 CLINICAL DATA:  Chest pain EXAM: CHEST  2 VIEW COMPARISON:  6/2/ 16 FINDINGS: Borderline cardiomegaly. Dual lead cardiac pacemaker is unchanged in position. No infiltrate or pleural effusion. No pulmonary edema. Degenerative changes thoracic spine. Metallic fixation plate cervical spine again noted. IMPRESSION: Borderline cardiomegaly. Dual lead cardiac pacemaker in place. Degenerative changes thoracic spine. No active disease. Electronically Signed   By: Lahoma Crocker M.D.   On: 09/03/2016 09:53    EKG  EKG Interpretation  Date/Time:  Sunday September 03 2016 08:59:53 EST Ventricular Rate:  75 PR Interval:    QRS Duration: 212 QT Interval:  516 QTC Calculation: 577 R Axis:   -84 Text Interpretation:  Atrial-sensed ventricular-paced rhythm No further analysis attempted due to paced rhythm Confirmed by Ashok Cordia  MD, Lennette Bihari ([id number]) on 09/03/2016 9:12:10 AM       Radiology Dg Chest 2 View  Result Date: 09/03/2016 CLINICAL DATA:  Chest pain EXAM: CHEST  2 VIEW COMPARISON:  6/2/ 16 FINDINGS: Borderline cardiomegaly. Dual lead cardiac pacemaker is unchanged in position. No infiltrate or pleural effusion. No pulmonary edema. Degenerative changes thoracic spine. Metallic fixation plate cervical spine again noted. IMPRESSION: Borderline cardiomegaly. Dual lead cardiac pacemaker in place. Degenerative changes thoracic spine. No active disease. Electronically Signed   By: Lahoma Crocker M.D.   On: 09/03/2016 09:53    Procedures Procedures (including critical care time)  Medications Ordered in ED Medications - No  data to  display   Initial Impression / Assessment and Plan / ED Course  I have reviewed the triage vital signs and the nursing notes.  Pertinent labs & imaging results that were available during my care of the patient were reviewed by me and considered in my medical decision making (see chart for details).  Clinical Course     Iv ns. Labs. Cxr.  Gi meds given for symptom relief.  Recheck pt, no pain or discomfort.  Initial trop normal.  Discussed plan, delta trop w pt, pt agreeable.  On recheck, still symptom free.  Repeat trop also normal, and not increased from initial.  Patient remains asymptomatic, and appears stable for d/c.     Final Clinical Impressions(s) / ED Diagnoses   Final diagnoses:  None    New Prescriptions New Prescriptions   No medications on file  I personally performed the services described in this documentation, which was scribed in my presence. The recorded information has been reviewed and considered. [name], MD     [name], MD 09/03/16 [phone]

## 2016-11-07 ENCOUNTER — Telehealth: Payer: Self-pay | Admitting: *Deleted

## 2016-11-07 NOTE — Telephone Encounter (Signed)
Pt walked into office says he felt some "thumping" on L side of chest earlier today - pt says he checked BP 115/66 HR 80 - declined to be added to nurse visit for an EKG says he wanted appt with Dr Lovena Le and get device checked. Pt says he has been feeling weaker than normal off and on for the last few weeks. Pt denied active chest pain dizziness SOB swelling weight gain. Appt made for Dr Lovena Le 3/15 [place] office - pt agreed to report to ED if symptoms worsened and was on his way to daughters house so that if he needed to go to ED he would have transportation but declined to be evaluated or report to ED at this time.

## 2016-11-28 ENCOUNTER — Encounter (INDEPENDENT_AMBULATORY_CARE_PROVIDER_SITE_OTHER): Payer: Self-pay | Admitting: *Deleted

## 2016-11-29 ENCOUNTER — Encounter (INDEPENDENT_AMBULATORY_CARE_PROVIDER_SITE_OTHER): Payer: Self-pay

## 2016-11-30 ENCOUNTER — Ambulatory Visit (INDEPENDENT_AMBULATORY_CARE_PROVIDER_SITE_OTHER): Payer: Medicare Other | Admitting: Internal Medicine

## 2016-11-30 ENCOUNTER — Encounter: Payer: Self-pay | Admitting: Internal Medicine

## 2016-11-30 VITALS — BP 108/58 | HR 76 | Ht 68.0 in | Wt 243.0 lb

## 2016-11-30 DIAGNOSIS — I495 Sick sinus syndrome: Secondary | ICD-10-CM | POA: Diagnosis not present

## 2016-11-30 DIAGNOSIS — Z950 Presence of cardiac pacemaker: Secondary | ICD-10-CM

## 2016-11-30 LAB — CUP PACEART INCLINIC DEVICE CHECK
Battery Voltage: 2.74 V
Brady Statistic RA Percent Paced: 99 %
Date Time Interrogation Session: 20180315151727
Implantable Lead Implant Date: 20090903
Implantable Lead Location: 753859
Implantable Pulse Generator Implant Date: 20090903
Lead Channel Impedance Value: 512 Ohm
Lead Channel Pacing Threshold Amplitude: 0.75 V
Lead Channel Pacing Threshold Pulse Width: 0.5 ms
Lead Channel Sensing Intrinsic Amplitude: 0.7 mV
Lead Channel Setting Pacing Amplitude: 2 V
Lead Channel Setting Pacing Pulse Width: 0.5 ms
MDC IDC LEAD IMPLANT DT: 20090903
MDC IDC LEAD LOCATION: 753860
MDC IDC MSMT BATTERY IMPEDANCE: 2800 Ohm
MDC IDC MSMT LEADCHNL RA IMPEDANCE VALUE: 412 Ohm
MDC IDC MSMT LEADCHNL RV SENSING INTR AMPL: 8.8 mV
MDC IDC PG SERIAL: 1269181
MDC IDC SET LEADCHNL RV PACING AMPLITUDE: 2.5 V
MDC IDC SET LEADCHNL RV SENSING SENSITIVITY: 2 mV
MDC IDC STAT BRADY RV PERCENT PACED: 99 % — AB
Pulse Gen Model: 5826

## 2016-11-30 NOTE — Patient Instructions (Signed)
Your physician wants you to follow-up in: 1 Year with Dr. Lovena Le.  You will receive a reminder letter in the mail two months in advance. If you don't receive a letter, please call our office to schedule the follow-up appointment.  Your physician wants you to follow-up in: 6 Months with the device clinic. You will receive a reminder letter in the mail two months in advance. If you don't receive a letter, please call our office to schedule the follow-up appointment.  Your physician recommends that you continue on your current medications as directed. Please refer to the Current Medication list given to you today.  If you need a refill on your cardiac medications before your next appointment, please call your pharmacy.  Thank you for choosing [facility]!

## 2016-11-30 NOTE — Progress Notes (Signed)
HPI [name] returns today for followup. He is a very pleasant 76 year old man with a history of symptomatic bradycardia, paroxysmal atrial fibrillation and persistent atrial flutter, borderline hypertension, status post permanent pacemaker insertion. The patient wears CPAP at home. He is s/p catheter ablation of atrial flutter and has maintained NSR mostly on high dose flecainide. He goes out of rhythm about 20% of the time. He does not feel it when he goes out of rhythm. He has had back problems. He has had trouble gaining weight. Allergies  Allergen Reactions  . Bee Venom Swelling  . Penicillins     REACTION: rash  . Shellfish Allergy Hives    Accelerated allergic reaction; questionable laryngospasm or laryngeal edema with some difficulty breathing prompting ED evaluation and treatment at [facility]     Current Outpatient Prescriptions  Medication Sig Dispense Refill  . apixaban (ELIQUIS) 5 MG TABS tablet Take 1 tablet (5 mg total) by mouth 2 (two) times daily. 60 tablet 1  . Calcium Carbonate-Vitamin D (CALCIUM-VITAMIN D3) 600-125 MG-UNIT TABS Take 1,200 mg by mouth 2 (two) times daily.     . Cholecalciferol (VITAMIN D-3 PO) Take 2,000 Units by mouth daily.    Marland Kitchen diltiazem (TIAZAC) 120 MG 24 hr capsule Take 120 mg by mouth every morning.     . diltiazem (TIAZAC) 300 MG 24 hr capsule Take 300 mg by mouth every evening.     . docusate sodium (COLACE) 100 MG capsule Take 100 mg by mouth 2 (two) times daily as needed for mild constipation.    . flecainide (TAMBOCOR) 100 MG tablet Take 1.5 tablets (150 mg total) by mouth 2 (two) times daily. 270 tablet 3  . gabapentin (NEURONTIN) 800 MG tablet Take 300 mg by mouth 2 (two) times daily. 300mg  in the morning and 600mg  in the evening.    . hydrocortisone (ANUSOL-HC) 2.5 % rectal cream Place 1 application rectally 2 (two) times daily as needed for hemorrhoids or itching.    . metoprolol tartrate (LOPRESSOR) 25 MG tablet Take 0.5 tablets (12.5 mg  total) by mouth 2 (two) times daily. 90 tablet 3  . morphine (MS CONTIN) 15 MG 12 hr tablet Take 15 mg by mouth every 12 (twelve) hours.    . pantoprazole (PROTONIX) 40 MG tablet Take 40 mg by mouth daily.    . polyethylene glycol (MIRALAX / GLYCOLAX) packet Take 17 g by mouth at bedtime.     . psyllium (METAMUCIL) 58.6 % powder Take 1 packet by mouth 1 day or 1 dose.    . Tamsulosin HCl (FLOMAX) 0.4 MG CAPS Take 0.4 mg by mouth daily.     . traMADol (ULTRAM) 50 MG tablet Take 1-2 tablet (50-100 mg total) by mouth every 6 (six) hours as needed. Maximum 400mg  a day. 90 tablet 4   No current facility-administered medications for this visit.      Past Medical History:  Diagnosis Date  . Atrial flutter ([place])    a. s/p ablation 01/2015.  Marland Kitchen BPH (benign prostatic hyperplasia)   . Cerebrovascular disease    carotid bruit; no focal stenosis by carotid ultrasound in 05/2008  . Dysrhythmia   . General weakness    +malaise, exercise intolerance, and near syncope  . Hypertension   . Low back pain    history of traumax2  . Paroxysmal atrial fibrillation (HCC)   . Presence of permanent cardiac pacemaker 2009  . Sleep apnea    c pap at night  . Tachy-brady syndrome (  [place])    a. s/p St. Jude pacemaker 2009.    ROS:   All systems reviewed and negative except as noted in the HPI.   Past Surgical History:  Procedure Laterality Date  . ABLATION    . CATARACT EXTRACTION     Bilateral  . CERVICAL DISCECTOMY  2006  . ELECTROPHYSIOLOGIC STUDY N/A 01/18/2015   Procedure: A-Flutter/A-Tach/Svt Ablation;  Surgeon: [name], MD;  Location: [facility] INVASIVE CV LAB CUPID;  Service: Cardiovascular;  Laterality: N/A;  . KNEE ARTHROSCOPY WITH MEDIAL MENISECTOMY Left 06/03/2015   Procedure: KNEE ARTHROSCOPY WITH MEDIAL MENISECTOMY;  Surgeon: [name], MD;  Location: [facility];  Service: Orthopedics;  Laterality: Left;  . PACEMAKER INSERTION  1966     Family History  Problem Relation Age of Onset  .  Hypertension    . Neuropathy Neg Hx   . Stroke Neg Hx      Social History   Social History  . Marital status: Divorced    Spouse name: N/A  . Number of children: 3  . Years of education: 12+   Occupational History  . Retired    Social History Main Topics  . Smoking status: Former Smoker    Packs/day: 1.00    Years: 0.50    Types: Cigarettes    Quit date: 03/27/1962  . Smokeless tobacco: Never Used  . Alcohol use No  . Drug use: No  . Sexual activity: Not on file   Other Topics Concern  . Not on file   Social History Narrative   Divorced with 3 adult children. Semi retired.    Caffeine use: once daily        BP (!) 108/58   Pulse 76   Ht 5\' 8"  (1.727 m)   Wt 243 lb (110.2 kg)   SpO2 96%   BMI 36.95 kg/m   Physical Exam:  Well appearing 76 year old man,NAD HEENT: Unremarkable Neck:  6 cm JVD, no thyromegally Lungs:  Clear, with no wheezes, rales, or rhonchi. HEART:  Regular rate rhythm, no murmurs, no rubs, no clicks Abd:  soft, obese,positive bowel sounds, no organomegally, no rebound, no guarding Ext:  2 plus pulses, no edema, no cyanosis, no clubbing Skin:  No rashes no nodules Neuro:  CN II through XII intact, motor grossly intact  ECG - NSR with AV pacing  DEVICE  Normal device function.  See PaceArt for details.   Assess/Plan: 1. Atrial fib - he is having some breakthrough atrial fib and has increased his flecainide to 150 bid. He appears to be tolerating the high dose of flecainide. 2. Atrial flutter - he has had no recurrent typical flutter 3. HTN - we discussed stopping his metoprolol but I would like him to stay on both the beta blocker and calcium blocker 4. Sinus node dysfunction - he is s/p PPM. His St. Jude PPM is working normally and he is asymptomatic.  [name].D.

## 2016-12-05 DIAGNOSIS — I480 Paroxysmal atrial fibrillation: Secondary | ICD-10-CM | POA: Diagnosis not present

## 2016-12-05 DIAGNOSIS — I10 Essential (primary) hypertension: Secondary | ICD-10-CM | POA: Diagnosis not present

## 2016-12-05 DIAGNOSIS — K760 Fatty (change of) liver, not elsewhere classified: Secondary | ICD-10-CM | POA: Diagnosis not present

## 2016-12-07 ENCOUNTER — Other Ambulatory Visit (HOSPITAL_COMMUNITY): Payer: Self-pay | Admitting: Internal Medicine

## 2016-12-07 DIAGNOSIS — R1011 Right upper quadrant pain: Secondary | ICD-10-CM

## 2016-12-11 ENCOUNTER — Ambulatory Visit (HOSPITAL_COMMUNITY)
Admission: RE | Admit: 2016-12-11 | Discharge: 2016-12-11 | Disposition: A | Discharge status: Home / Self Care | Payer: Medicare Other | Source: Ambulatory Visit | Attending: Internal Medicine | Admitting: Internal Medicine

## 2016-12-11 DIAGNOSIS — R1011 Right upper quadrant pain: Secondary | ICD-10-CM | POA: Diagnosis not present

## 2016-12-11 DIAGNOSIS — R932 Abnormal findings on diagnostic imaging of liver and biliary tract: Secondary | ICD-10-CM | POA: Diagnosis not present

## 2016-12-11 DIAGNOSIS — K7689 Other specified diseases of liver: Secondary | ICD-10-CM | POA: Diagnosis not present

## 2016-12-28 ENCOUNTER — Other Ambulatory Visit (INDEPENDENT_AMBULATORY_CARE_PROVIDER_SITE_OTHER): Payer: Self-pay | Admitting: Internal Medicine

## 2016-12-28 ENCOUNTER — Telehealth (INDEPENDENT_AMBULATORY_CARE_PROVIDER_SITE_OTHER): Payer: Self-pay | Admitting: *Deleted

## 2016-12-28 ENCOUNTER — Encounter (INDEPENDENT_AMBULATORY_CARE_PROVIDER_SITE_OTHER): Payer: Self-pay | Admitting: *Deleted

## 2016-12-28 DIAGNOSIS — Z86010 Personal history of colonic polyps: Secondary | ICD-10-CM | POA: Insufficient documentation

## 2016-12-28 NOTE — Telephone Encounter (Signed)
Referring MD/PCP: fagan   Procedure: tcs w propofol  Reason/Indication:  Hx polyps  Has patient had this procedure before?  Yes, 2011  If so, when, by whom and where?    Is there a family history of colon cancer?  no  Who?  What age when diagnosed?    Is patient diabetic?   no      Does patient have prosthetic heart valve or mechanical valve?  no  Do you have a pacemaker?  yes  Has patient ever had endocarditis? no  Has patient had joint replacement within last 12 months?  no  Does patient tend to be constipated or take laxatives? yes  Does patient have a history of alcohol/drug use?  no  Is patient on Coumadin, Plavix and/or Aspirin? yes  Medications: see epic  Allergies: see epic  Medication Adjustment per Dr Laural Golden: eliquis 2 days before  Procedure date & time: 01/26/17 at 830 preop 5/7 @ 11

## 2016-12-28 NOTE — Telephone Encounter (Signed)
Patient is scheduled for colonoscopy 01/26/17 and needs to stop Eliquis 2 days prior -- please advise if this is ok

## 2016-12-28 NOTE — Telephone Encounter (Signed)
OK to hold Eliquis 2 days prior to procedure and resume night of procedure if ok with MD.

## 2016-12-28 NOTE — Telephone Encounter (Signed)
Patient needs trilyte 

## 2016-12-29 NOTE — Telephone Encounter (Signed)
Patient aware.

## 2017-01-01 MED ORDER — PEG 3350-KCL-NA BICARB-NACL 420 G PO SOLR: 4000.0000 mL | Freq: Once | ORAL | 0 refills | Status: AC

## 2017-01-01 NOTE — Telephone Encounter (Signed)
agree

## 2017-01-06 ENCOUNTER — Other Ambulatory Visit: Payer: Self-pay | Admitting: Neurology

## 2017-01-08 ENCOUNTER — Other Ambulatory Visit: Payer: Self-pay | Admitting: Neurology

## 2017-01-08 ENCOUNTER — Other Ambulatory Visit (HOSPITAL_COMMUNITY): Payer: Self-pay | Admitting: Internal Medicine

## 2017-01-08 ENCOUNTER — Ambulatory Visit (HOSPITAL_COMMUNITY)
Admission: RE | Admit: 2017-01-08 | Discharge: 2017-01-08 | Disposition: A | Discharge status: Home / Self Care | Payer: Medicare Other | Source: Ambulatory Visit | Attending: Internal Medicine | Admitting: Internal Medicine

## 2017-01-08 DIAGNOSIS — R0600 Dyspnea, unspecified: Secondary | ICD-10-CM

## 2017-01-08 DIAGNOSIS — I480 Paroxysmal atrial fibrillation: Secondary | ICD-10-CM | POA: Diagnosis not present

## 2017-01-08 DIAGNOSIS — I700 Atherosclerosis of aorta: Secondary | ICD-10-CM | POA: Diagnosis not present

## 2017-01-08 DIAGNOSIS — R0602 Shortness of breath: Secondary | ICD-10-CM | POA: Diagnosis not present

## 2017-01-08 DIAGNOSIS — I5022 Chronic systolic (congestive) heart failure: Secondary | ICD-10-CM | POA: Diagnosis not present

## 2017-01-08 DIAGNOSIS — J811 Chronic pulmonary edema: Secondary | ICD-10-CM | POA: Insufficient documentation

## 2017-01-08 DIAGNOSIS — Z79899 Other long term (current) drug therapy: Secondary | ICD-10-CM | POA: Diagnosis not present

## 2017-01-09 ENCOUNTER — Ambulatory Visit: Payer: Medicare Other | Admitting: Internal Medicine

## 2017-01-09 NOTE — Telephone Encounter (Signed)
Rx printed, signed, faxed to pharmacy. Per Dr. Jaynee Eagles, pt must get any additional refills from [facility] or [facility].

## 2017-01-10 ENCOUNTER — Telehealth: Payer: Self-pay | Admitting: Neurology

## 2017-01-10 NOTE — Telephone Encounter (Signed)
Pt reported that he takes MS Contin 15 mg every 12 hrs for hx of back pain/injury x 2.

## 2017-01-10 NOTE — Telephone Encounter (Signed)
Erin/[facility] called said the pt regularly gets morphine from a different prescriber every month. She needs approval to fill tramadol.

## 2017-01-10 NOTE — Telephone Encounter (Signed)
Please stop the tramadol and I will not prescribe it to him, let patient know he is at risk talking all this medication can cause death. He should not get multiple narcotics from different doctors. thanks

## 2017-01-11 NOTE — Telephone Encounter (Signed)
Called pharmacist and advised to delete tramadol rx. Pt needs to contact [facility]CP, [facility] or provider that also manages his morphine for tramadol refills. Verbalized understanding and appreciation for call.

## 2017-01-15 ENCOUNTER — Ambulatory Visit (INDEPENDENT_AMBULATORY_CARE_PROVIDER_SITE_OTHER): Payer: Medicare Other | Admitting: Internal Medicine

## 2017-01-15 ENCOUNTER — Other Ambulatory Visit (HOSPITAL_COMMUNITY)
Admission: RE | Admit: 2017-01-15 | Discharge: 2017-01-15 | Disposition: A | Discharge status: Home / Self Care | Payer: Medicare Other | Source: Ambulatory Visit | Attending: Internal Medicine | Admitting: Internal Medicine

## 2017-01-15 ENCOUNTER — Encounter: Payer: Self-pay | Admitting: Internal Medicine

## 2017-01-15 DIAGNOSIS — H5203 Hypermetropia, bilateral: Secondary | ICD-10-CM | POA: Diagnosis not present

## 2017-01-15 DIAGNOSIS — I480 Paroxysmal atrial fibrillation: Secondary | ICD-10-CM | POA: Insufficient documentation

## 2017-01-15 DIAGNOSIS — H524 Presbyopia: Secondary | ICD-10-CM | POA: Diagnosis not present

## 2017-01-15 DIAGNOSIS — Z961 Presence of intraocular lens: Secondary | ICD-10-CM | POA: Diagnosis not present

## 2017-01-15 DIAGNOSIS — I495 Sick sinus syndrome: Secondary | ICD-10-CM | POA: Diagnosis not present

## 2017-01-15 DIAGNOSIS — H52223 Regular astigmatism, bilateral: Secondary | ICD-10-CM | POA: Diagnosis not present

## 2017-01-15 LAB — BASIC METABOLIC PANEL
Anion gap: 7 (ref 5–15)
BUN: 20 mg/dL (ref 6–20)
CHLORIDE: 99 mmol/L — AB (ref 101–111)
CO2: 30 mmol/L (ref 22–32)
CREATININE: 1.33 mg/dL — AB (ref 0.61–1.24)
Calcium: 9.4 mg/dL (ref 8.9–10.3)
GFR calc Af Amer: 59 mL/min — ABNORMAL LOW (ref >60)
GFR calc non Af Amer: 51 mL/min — ABNORMAL LOW (ref >60)
Glucose, Bld: 103 mg/dL — ABNORMAL HIGH (ref 65–99)
Potassium: 3.9 mmol/L (ref 3.5–5.1)
Sodium: 136 mmol/L (ref 135–145)

## 2017-01-15 LAB — CUP PACEART INCLINIC DEVICE CHECK
Date Time Interrogation Session: 20180430090852
Implantable Lead Implant Date: 20090903
Implantable Lead Location: 753859
Implantable Pulse Generator Implant Date: 20090903
Lead Channel Pacing Threshold Amplitude: 1.25 V
Lead Channel Pacing Threshold Amplitude: 1.25 V
Lead Channel Pacing Threshold Pulse Width: 0.5 ms
Lead Channel Setting Pacing Amplitude: 2.5 V
Lead Channel Setting Pacing Pulse Width: 0.5 ms
MDC IDC LEAD IMPLANT DT: 20090903
MDC IDC LEAD LOCATION: 753860
MDC IDC MSMT BATTERY IMPEDANCE: 2900 Ohm
MDC IDC MSMT BATTERY VOLTAGE: 2.75 V
MDC IDC MSMT LEADCHNL RA IMPEDANCE VALUE: 392 Ohm
MDC IDC MSMT LEADCHNL RA PACING THRESHOLD PULSEWIDTH: 0.5 ms
MDC IDC MSMT LEADCHNL RV IMPEDANCE VALUE: 495 Ohm
MDC IDC SET LEADCHNL RV PACING AMPLITUDE: 2.5 V
MDC IDC SET LEADCHNL RV SENSING SENSITIVITY: 2 mV
Pulse Gen Model: 5826
Pulse Gen Serial Number: 1269181

## 2017-01-15 NOTE — Progress Notes (Signed)
HPI Mr. Gonyer returns today for followup. He is a very pleasant 76 year old man with a history of symptomatic bradycardia, paroxysmal atrial fibrillation and persistent atrial flutter, borderline hypertension, status post permanent pacemaker insertion. The patient wears CPAP at home. He is s/p catheter ablation of atrial flutter and has maintained NSR mostly on high dose flecainide. He goes out of rhythm (atrial fib) about 30% of the time.  He developed sob last week and saw his primary MD and a CXR demonstrated increased fluid and he was given oral lasix and has dropped about 10 lbs. He feels better and his dyspnea has resolved. His weight is down from 250's to to 242 this morning at his house.  Allergies  Allergen Reactions  . Bee Venom Swelling  . Penicillins     REACTION: rash  . Shellfish Allergy Hives    Accelerated allergic reaction; questionable laryngospasm or laryngeal edema with some difficulty breathing prompting ED evaluation and treatment at [facility]     Current Outpatient Prescriptions  Medication Sig Dispense Refill  . apixaban (ELIQUIS) 5 MG TABS tablet Take 1 tablet (5 mg total) by mouth 2 (two) times daily. 60 tablet 1  . Calcium Carbonate-Vitamin D (CALCIUM-VITAMIN D3) 600-125 MG-UNIT TABS Take 1,200 mg by mouth 2 (two) times daily.     . Cholecalciferol (VITAMIN D-3 PO) Take 2,000 Units by mouth daily.    Marland Kitchen diltiazem (TIAZAC) 120 MG 24 hr capsule Take 120 mg by mouth every morning.     . diltiazem (TIAZAC) 300 MG 24 hr capsule Take 300 mg by mouth every evening.     . docusate sodium (COLACE) 100 MG capsule Take 100 mg by mouth 2 (two) times daily as needed for mild constipation.    . flecainide (TAMBOCOR) 100 MG tablet Take 1.5 tablets (150 mg total) by mouth 2 (two) times daily. 270 tablet 3  . gabapentin (NEURONTIN) 800 MG tablet Take 300 mg by mouth 2 (two) times daily. 300mg  in the morning and 600mg  in the evening.    . hydrocortisone (ANUSOL-HC) 2.5 % rectal  cream Place 1 application rectally 2 (two) times daily as needed for hemorrhoids or itching.    . metoprolol tartrate (LOPRESSOR) 25 MG tablet Take 0.5 tablets (12.5 mg total) by mouth 2 (two) times daily. 90 tablet 3  . morphine (MS CONTIN) 15 MG 12 hr tablet Take 15 mg by mouth every 12 (twelve) hours.    . pantoprazole (PROTONIX) 40 MG tablet Take 40 mg by mouth daily.    . polyethylene glycol (MIRALAX / GLYCOLAX) packet Take 17 g by mouth at bedtime.     . psyllium (METAMUCIL) 58.6 % powder Take 1 packet by mouth 1 day or 1 dose.    . Tamsulosin HCl (FLOMAX) 0.4 MG CAPS Take 0.4 mg by mouth daily.     . traMADol (ULTRAM) 50 MG tablet TAKE ONE TO TWO TABLETS BY MOUTH EVERY 6 HOURS AS NEEDED *MAXIMUM 400 MG A DAY* 90 tablet 1   No current facility-administered medications for this visit.      Past Medical History:  Diagnosis Date  . Atrial flutter ([place])    a. s/p ablation 01/2015.  Marland Kitchen BPH (benign prostatic hyperplasia)   . Cerebrovascular disease    carotid bruit; no focal stenosis by carotid ultrasound in 05/2008  . Dysrhythmia   . General weakness    +malaise, exercise intolerance, and near syncope  . Hypertension   . Low back pain    history of  traumax2  . Paroxysmal atrial fibrillation ([facility])   . Presence of permanent cardiac pacemaker 2009  . Sleep apnea    c pap at night  . Tachy-brady syndrome ([place])    a. s/p St. Jude pacemaker 2009.    ROS:   All systems reviewed and negative except as noted in the HPI.   Past Surgical History:  Procedure Laterality Date  . ABLATION    . CATARACT EXTRACTION     Bilateral  . CERVICAL DISCECTOMY  2006  . ELECTROPHYSIOLOGIC STUDY N/A 01/18/2015   Procedure: A-Flutter/A-Tach/Svt Ablation;  Surgeon: [name], MD;  Location: [facility];  Service: Cardiovascular;  Laterality: N/A;  . KNEE ARTHROSCOPY WITH MEDIAL MENISECTOMY Left 06/03/2015   Procedure: KNEE ARTHROSCOPY WITH MEDIAL MENISECTOMY;  Surgeon: [name],  MD;  Location: [facility];  Service: Orthopedics;  Laterality: Left;  . PACEMAKER INSERTION  1966     Family History  Problem Relation Age of Onset  . Hypertension    . Neuropathy Neg Hx   . Stroke Neg Hx      Social History   Social History  . Marital status: Divorced    Spouse name: N/A  . Number of children: 3  . Years of education: 12+   Occupational History  . Retired    Social History Main Topics  . Smoking status: Former Smoker    Packs/day: 1.00    Years: 0.50    Types: Cigarettes    Quit date: 03/27/1962  . Smokeless tobacco: Never Used  . Alcohol use No  . Drug use: No  . Sexual activity: Not on file   Other Topics Concern  . Not on file   Social History Narrative   Divorced with 3 adult children. Semi retired.    Caffeine use: once daily        BP 118/68   Pulse 75   Ht 5\' 8"  (1.727 m)   Wt 245 lb 9.6 oz (111.4 kg)   SpO2 95%   BMI 37.34 kg/m   Physical Exam:  Well appearing 76 year old man,NAD HEENT: Unremarkable Neck:  6 cm JVD, no thyromegally Lungs:  Clear, with no wheezes, rales, or rhonchi. HEART:  Regular rate rhythm, no murmurs, no rubs, no clicks Abd:  soft, obese,positive bowel sounds, no organomegally, no rebound, no guarding Ext:  2 plus pulses, no edema, no cyanosis, no clubbing Skin:  No rashes no nodules Neuro:  CN II through XII intact, motor grossly intact  DEVICE  Normal device function.  See PaceArt for details.   Assess/Plan: 1. Atrial fib - he is having some breakthrough atrial fib and has increased his flecainide to 150 bid. He appears to be tolerating the high dose of flecainide. 2. PPM - his St. Jude device is working normally. Will recheck in several months. 3. HTN - we discussed stopping his metoprolol but I would like him to stay on both the beta blocker and calcium blocker 4. Acute on chronic diastolic heart failure - he is improved. I have asked him to have his BMP checked today. If his weight remains below  245, then he is to hold lasix but if it goes over 245, then he will need to take lasix each day it is over this.  Mikle Bosworth.D.

## 2017-01-15 NOTE — Patient Instructions (Addendum)
Your physician wants you to follow-up in: 1 Year with Dr. Lovena Le. You will receive a reminder letter in the mail two months in advance. If you don't receive a letter, please call our office to schedule the follow-up appointment.  Your physician recommends that you return for lab work in: Today  Your physician recommends that you continue on your current medications as directed. Please refer to the Current Medication list given to you today.  Take Lasix 40 mg Daily if weight is 245 lbs   Your physician recommends that you schedule a follow-up appointment in: 6 Months with the [facility].   If you need a refill on your cardiac medications before your next appointment, please call your pharmacy.  Thank you for choosing [facility]!

## 2017-01-22 ENCOUNTER — Encounter (HOSPITAL_COMMUNITY): Payer: Self-pay

## 2017-01-22 ENCOUNTER — Inpatient Hospital Stay (HOSPITAL_COMMUNITY): Admission: RE | Admit: 2017-01-22 | Payer: Medicare Other | Source: Ambulatory Visit

## 2017-01-22 ENCOUNTER — Encounter (HOSPITAL_COMMUNITY)
Admission: RE | Admit: 2017-01-22 | Discharge: 2017-01-22 | Disposition: A | Discharge status: Home / Self Care | Payer: Medicare Other | Source: Ambulatory Visit | Attending: Internal Medicine | Admitting: Internal Medicine

## 2017-01-26 ENCOUNTER — Ambulatory Visit (HOSPITAL_COMMUNITY)
Admission: RE | Admit: 2017-01-26 | Discharge: 2017-01-26 | Disposition: A | Discharge status: Home / Self Care | Payer: Medicare Other | Source: Ambulatory Visit | Attending: Internal Medicine | Admitting: Internal Medicine

## 2017-01-26 ENCOUNTER — Ambulatory Visit (HOSPITAL_COMMUNITY): Payer: Medicare Other | Admitting: Anesthesiology

## 2017-01-26 ENCOUNTER — Encounter (HOSPITAL_COMMUNITY): Payer: Self-pay | Admitting: *Deleted

## 2017-01-26 ENCOUNTER — Encounter (HOSPITAL_COMMUNITY): Payer: Self-pay

## 2017-01-26 ENCOUNTER — Encounter (HOSPITAL_COMMUNITY)
Admission: RE | Disposition: A | Discharge status: Home / Self Care | Payer: Self-pay | Source: Ambulatory Visit | Attending: Internal Medicine

## 2017-01-26 DIAGNOSIS — Z79891 Long term (current) use of opiate analgesic: Secondary | ICD-10-CM | POA: Insufficient documentation

## 2017-01-26 DIAGNOSIS — K5730 Diverticulosis of large intestine without perforation or abscess without bleeding: Secondary | ICD-10-CM | POA: Insufficient documentation

## 2017-01-26 DIAGNOSIS — Z950 Presence of cardiac pacemaker: Secondary | ICD-10-CM | POA: Diagnosis not present

## 2017-01-26 DIAGNOSIS — D122 Benign neoplasm of ascending colon: Secondary | ICD-10-CM | POA: Insufficient documentation

## 2017-01-26 DIAGNOSIS — G4730 Sleep apnea, unspecified: Secondary | ICD-10-CM | POA: Insufficient documentation

## 2017-01-26 DIAGNOSIS — Z9989 Dependence on other enabling machines and devices: Secondary | ICD-10-CM | POA: Insufficient documentation

## 2017-01-26 DIAGNOSIS — Z7901 Long term (current) use of anticoagulants: Secondary | ICD-10-CM | POA: Insufficient documentation

## 2017-01-26 DIAGNOSIS — Z860100 Personal history of colon polyps, unspecified: Secondary | ICD-10-CM | POA: Insufficient documentation

## 2017-01-26 DIAGNOSIS — Z6837 Body mass index (BMI) 37.0-37.9, adult: Secondary | ICD-10-CM | POA: Insufficient documentation

## 2017-01-26 DIAGNOSIS — I480 Paroxysmal atrial fibrillation: Secondary | ICD-10-CM | POA: Insufficient documentation

## 2017-01-26 DIAGNOSIS — Z09 Encounter for follow-up examination after completed treatment for conditions other than malignant neoplasm: Secondary | ICD-10-CM | POA: Diagnosis not present

## 2017-01-26 DIAGNOSIS — Z1211 Encounter for screening for malignant neoplasm of colon: Secondary | ICD-10-CM | POA: Diagnosis not present

## 2017-01-26 DIAGNOSIS — K644 Residual hemorrhoidal skin tags: Secondary | ICD-10-CM | POA: Insufficient documentation

## 2017-01-26 DIAGNOSIS — Z79899 Other long term (current) drug therapy: Secondary | ICD-10-CM | POA: Diagnosis not present

## 2017-01-26 DIAGNOSIS — K635 Polyp of colon: Secondary | ICD-10-CM | POA: Diagnosis not present

## 2017-01-26 DIAGNOSIS — Z87891 Personal history of nicotine dependence: Secondary | ICD-10-CM | POA: Insufficient documentation

## 2017-01-26 DIAGNOSIS — I10 Essential (primary) hypertension: Secondary | ICD-10-CM | POA: Diagnosis not present

## 2017-01-26 HISTORY — PX: COLONOSCOPY WITH PROPOFOL: SHX5780

## 2017-01-26 HISTORY — PX: POLYPECTOMY: SHX5525

## 2017-01-26 SURGERY — COLONOSCOPY WITH PROPOFOL
Anesthesia: Monitor Anesthesia Care

## 2017-01-26 MED ORDER — PROPOFOL 10 MG/ML IV BOLUS
INTRAVENOUS | Status: AC
  Filled 2017-01-26: qty 40

## 2017-01-26 MED ORDER — LACTATED RINGERS IV SOLN
INTRAVENOUS | Status: DC
Start: 2017-01-26 — End: 2017-01-26
  Administered 2017-01-26: 08:00:00 via INTRAVENOUS

## 2017-01-26 MED ORDER — CHLORHEXIDINE GLUCONATE CLOTH 2 % EX PADS: 6.0000 | MEDICATED_PAD | Freq: Once | CUTANEOUS | Status: DC

## 2017-01-26 MED ORDER — PROPOFOL 500 MG/50ML IV EMUL
INTRAVENOUS | Status: DC | PRN
  Administered 2017-01-26: 09:00:00 via INTRAVENOUS
  Administered 2017-01-26: 125 ug/kg/min via INTRAVENOUS

## 2017-01-26 MED ORDER — MIDAZOLAM HCL 2 MG/2ML IJ SOLN
INTRAMUSCULAR | Status: AC
  Filled 2017-01-26: qty 2

## 2017-01-26 MED ORDER — MIDAZOLAM HCL 2 MG/2ML IJ SOLN
1.0000 mg | INTRAMUSCULAR | Status: AC
  Administered 2017-01-26: 2 mg via INTRAVENOUS

## 2017-01-26 MED ORDER — FENTANYL CITRATE (PF) 100 MCG/2ML IJ SOLN
INTRAMUSCULAR | Status: AC
  Filled 2017-01-26: qty 2

## 2017-01-26 MED ORDER — FENTANYL CITRATE (PF) 100 MCG/2ML IJ SOLN
25.0000 ug | Freq: Once | INTRAMUSCULAR | Status: AC
Start: 2017-01-26 — End: 2017-01-26
  Administered 2017-01-26: 25 ug via INTRAVENOUS

## 2017-01-26 MED ORDER — MINERAL OIL PO OIL
TOPICAL_OIL | ORAL | Status: AC
  Filled 2017-01-26: qty 30

## 2017-01-26 NOTE — Anesthesia Postprocedure Evaluation (Signed)
Anesthesia Post Note  Patient: Abdullahi Vallone.  Procedure(s) Performed: Procedure(s) (LRB): COLONOSCOPY WITH PROPOFOL (N/A) POLYPECTOMY  Patient location during evaluation: PACU Anesthesia Type: MAC Level of consciousness: awake and alert and oriented Pain management: pain level controlled Vital Signs Assessment: post-procedure vital signs reviewed and stable Respiratory status: spontaneous breathing Cardiovascular status: blood pressure returned to baseline and stable Postop Assessment: no signs of nausea or vomiting Anesthetic complications: no     Last Vitals:  Vitals:   01/26/17 0733 01/26/17 0910  BP: 125/74 (!) 99/57  Pulse:  70  Resp: 20 13  Temp:  36.7 C    Last Pain:  Vitals:   01/26/17 0828  TempSrc:   PainSc: 6                  Yoshi Vicencio

## 2017-01-26 NOTE — Anesthesia Preprocedure Evaluation (Signed)
Anesthesia Evaluation  Patient identified by MRN, date of birth, ID band Patient awake    Reviewed: Allergy & Precautions, NPO status , Patient's Chart, lab work & pertinent test results  Airway Mallampati: II  TM Distance: >3 FB Neck ROM: Full    Dental  (+) Teeth Intact   Pulmonary shortness of breath and with exertion, sleep apnea and Continuous Positive Airway Pressure Ventilation , former smoker,    breath sounds clear to auscultation       Cardiovascular hypertension, Pt. on medications + DOE  + dysrhythmias Atrial Fibrillation + pacemaker  Rhythm:Regular Rate:Normal     Neuro/Psych    GI/Hepatic negative GI ROS,   Endo/Other  Morbid obesity  Renal/GU      Musculoskeletal   Abdominal   Peds  Hematology   Anesthesia Other Findings   Reproductive/Obstetrics                             Anesthesia Physical Anesthesia Plan  ASA: III  Anesthesia Plan: MAC   Post-op Pain Management:    Induction: Intravenous  Airway Management Planned: Simple Face Mask  Additional Equipment:   Intra-op Plan:   Post-operative Plan:   Informed Consent: I have reviewed the patients History and Physical, chart, labs and discussed the procedure including the risks, benefits and alternatives for the proposed anesthesia with the patient or authorized representative who has indicated his/her understanding and acceptance.     Plan Discussed with:   Anesthesia Plan Comments:         Anesthesia Quick Evaluation

## 2017-01-26 NOTE — Discharge Instructions (Signed)
Resume Eliquis on 01/29/2017. Resume other medications and high fiber diet. No driving for 24 hours. Physician will call with biopsy results.   Colonoscopy, Adult, Care After This sheet gives you information about how to care for yourself after your procedure. Your health care provider may also give you more specific instructions. If you have problems or questions, contact your health care provider. What can I expect after the procedure? After the procedure, it is common to have:  A small amount of blood in your stool for 24 hours after the procedure.  Some gas.  Mild abdominal cramping or bloating. Follow these instructions at home: General instructions    For the first 24 hours after the procedure:  Do not drive or use machinery.  Do not sign important documents.  Do not drink alcohol.  Do your regular daily activities at a slower pace than normal.  Eat soft, easy-to-digest foods.  Rest often.  Take over-the-counter or prescription medicines only as told by your health care provider.  It is up to you to get the results of your procedure. Ask your health care provider, or the department performing the procedure, when your results will be ready. Relieving cramping and bloating   Try walking around when you have cramps or feel bloated.  Apply heat to your abdomen as told by your health care provider. Use a heat source that your health care provider recommends, such as a moist heat pack or a heating pad.  Place a towel between your skin and the heat source.  Leave the heat on for 20-30 minutes.  Remove the heat if your skin turns bright red. This is especially important if you are unable to feel pain, heat, or cold. You may have a greater risk of getting burned. Eating and drinking   Drink enough fluid to keep your urine clear or pale yellow.  Resume your normal diet as instructed by your health care provider. Avoid heavy or fried foods that are hard to  digest.  Avoid drinking alcohol for as long as instructed by your health care provider. Contact a health care provider if:  You have blood in your stool 2-3 days after the procedure. Get help right away if:  You have more than a small spotting of blood in your stool.  You pass large blood clots in your stool.  Your abdomen is swollen.  You have nausea or vomiting.  You have a fever.  You have increasing abdominal pain that is not relieved with medicine. This information is not intended to replace advice given to you by your health care provider. Make sure you discuss any questions you have with your health care provider. Document Released: 04/18/2004 Document Revised: 05/29/2016 Document Reviewed: 11/16/2015 Elsevier Interactive Patient Education  2017 [employer].   Colon Polyps Polyps are tissue growths inside the body. Polyps can grow in many places, including the large intestine (colon). A polyp may be a round bump or a mushroom-shaped growth. You could have one polyp or several. Most colon polyps are noncancerous (benign). However, some colon polyps can become cancerous over time. What are the causes? The exact cause of colon polyps is not known. What increases the risk? This condition is more likely to develop in people who:  Have a family history of colon cancer or colon polyps.  Are older than 36 or older than 45 if they are African American.  Have inflammatory bowel disease, such as ulcerative colitis or Crohn disease.  Are overweight.  Smoke  cigarettes.  Do not get enough exercise.  Drink too much alcohol.  Eat a diet that is:  High in fat and red meat.  Low in fiber.  Had childhood cancer that was treated with abdominal radiation. What are the signs or symptoms? Most polyps do not cause symptoms. If you have symptoms, they may include:  Blood coming from your rectum when having a bowel movement.  Blood in your stool.The stool may look dark red  or black.  A change in bowel habits, such as constipation or diarrhea. How is this diagnosed? This condition is diagnosed with a colonoscopy. This is a procedure that uses a lighted, flexible scope to look at the inside of your colon. How is this treated? Treatment for this condition involves removing any polyps that are found. Those polyps will then be tested for cancer. If cancer is found, your health care provider will talk to you about options for colon cancer treatment. Follow these instructions at home: Diet   Eat plenty of fiber, such as fruits, vegetables, and whole grains.  Eat foods that are high in calcium and vitamin D, such as milk, cheese, yogurt, eggs, liver, fish, and broccoli.  Limit foods high in fat, red meats, and processed meats, such as hot dogs, sausage, bacon, and lunch meats.  Maintain a healthy weight, or lose weight if recommended by your health care provider. General instructions   Do not smoke cigarettes.  Do not drink alcohol excessively.  Keep all follow-up visits as told by your health care provider. This is important. This includes keeping regularly scheduled colonoscopies. Talk to your health care provider about when you need a colonoscopy.  Exercise every day or as told by your health care provider. Contact a health care provider if:  You have new or worsening bleeding during a bowel movement.  You have new or increased blood in your stool.  You have a change in bowel habits.  You unexpectedly lose weight. This information is not intended to replace advice given to you by your health care provider. Make sure you discuss any questions you have with your health care provider. Document Released: 05/31/2004 Document Revised: 02/10/2016 Document Reviewed: 07/26/2015 Elsevier Interactive Patient Education  2017 [employer].

## 2017-01-26 NOTE — Op Note (Addendum)
[facility] Patient Name: [name] Procedure Date: 01/26/2017 8:11 AM MRN: [id number] Date of Birth: [birth year] Attending MD: Hildred Laser , MD CSN: [id number] Age: 76 Admit Type: Outpatient Procedure:                Colonoscopy Indications:              High risk colon cancer surveillance: Personal                            history of colonic polyps Providers:                Hildred Laser, MD, Otis Peak B. Sharon Seller, RN, Randa Spike, Technician Referring MD:             [name], MD Medicines:                Propofol per Anesthesia Complications:            No immediate complications. Estimated Blood Loss:     Estimated blood loss was minimal. Procedure:                Pre-Anesthesia Assessment:                           - Prior to the procedure, a History and Physical                            was performed, and patient medications and                            allergies were reviewed. The patient's tolerance of                            previous anesthesia was also reviewed. The risks                            and benefits of the procedure and the sedation                            options and risks were discussed with the patient.                            All questions were answered, and informed consent                            was obtained. Prior Anticoagulants: The patient                            last took Eliquis (apixaban) 2 days prior to the                            procedure. ASA Grade Assessment: III - A patient  with severe systemic disease. After reviewing the                            risks and benefits, the patient was deemed in                            satisfactory condition to undergo the procedure.                           After obtaining informed consent, the colonoscope                            was passed under direct vision. Throughout the   procedure, the patient's blood pressure, pulse, and                            oxygen saturations were monitored continuously. The                            EC-[id number] ([id number]) scope was introduced through                            the anus and advanced to the the cecum, identified                            by appendiceal orifice and ileocecal valve. The                            colonoscopy was performed without difficulty. The                            patient tolerated the procedure well. The quality                            of the bowel preparation was good. The ileocecal                            valve, appendiceal orifice, and rectum were                            photographed. Scope In: 8:34:17 AM Scope Out: 9:03:41 AM Scope Withdrawal Time: 0 hours 18 minutes 47 seconds  Total Procedure Duration: 0 hours 29 minutes 24 seconds  Findings:      The perianal and digital rectal examinations were normal.      A 7 mm polyp was found in the proximal ascending colon. The polyp was       semi-sessile. The polyp was removed with a cold snare. Resection and       retrieval were complete. To prevent bleeding after the polypectomy, two       hemostatic clips were successfully placed (MR conditional). There was no       bleeding at the end of the procedure.      A few medium-mouthed diverticula were found in the sigmoid colon.      External hemorrhoids were found during retroflexion. The hemorrhoids  were small. Impression:               - One 7 mm polyp in the proximal ascending colon,                            removed with a cold snare. Resected and retrieved.                            Clips (MR conditional) were placed.                           - Diverticulosis in the sigmoid colon.                           - External hemorrhoids. Moderate Sedation:      Per Anesthesia Care Recommendation:           - Patient has a contact number available for                             emergencies. The signs and symptoms of potential                            delayed complications were discussed with the                            patient. Return to normal activities tomorrow.                            Written discharge instructions were provided to the                            patient.                           - High fiber diet today.                           - Continue present medications.                           - Resume Eliquis (apixaban) at prior dose in 3 days.                           - Await pathology results.                           - Repeat colonoscopy date to be determined after                            pending pathology results are reviewed for                            surveillance. Procedure Code(s):        --- Professional ---  45385, Colonoscopy, flexible; with removal of                            tumor(s), polyp(s), or other lesion(s) by snare                            technique Diagnosis Code(s):        --- Professional ---                           Z86.010, Personal history of colonic polyps                           D12.2, Benign neoplasm of ascending colon                           K64.4, Residual hemorrhoidal skin tags                           K57.30, Diverticulosis of large intestine without                            perforation or abscess without bleeding CPT copyright 2016 American [employer] Association. All rights reserved. The codes documented in this report are preliminary and upon coder review may  be revised to meet current compliance requirements. Hildred Laser, MD Hildred Laser, MD 01/26/2017 9:10:55 AM This report has been signed electronically. Number of Addenda: 0

## 2017-01-26 NOTE — H&P (Signed)
[name]. is an 76 y.o. male.   Chief Complaint: Patient is here for colonoscopy. HPI: Patient is 76 year old Caucasian multiple medical problems who is here for surveillance colonoscopy. He had colonoscopy in 2011 with removal of 2 small polyps and these are tubular adenomas. Patient was advised to return for follow-up examined 7 years. He has no GI complaints. He denies abdominal pain change in bowel habits or rectal bleeding. He has been off Eliquis for 2 days. Family history is negative for CRC.  Past Medical History:  Diagnosis Date  . Atrial flutter ([place])    a. s/p ablation 01/2015.  Marland Kitchen BPH (benign prostatic hyperplasia)   . Cerebrovascular disease    carotid bruit; no focal stenosis by carotid ultrasound in 05/2008  . Dysrhythmia   . General weakness    +malaise, exercise intolerance, and near syncope  . Hypertension   . Low back pain    history of traumax2  . Paroxysmal atrial fibrillation (HCC)   . Presence of permanent cardiac pacemaker 2009  . Sleep apnea    c pap at night  . Tachy-brady syndrome ([place])    a. s/p St. Jude pacemaker 2009.    Past Surgical History:  Procedure Laterality Date  . ABLATION    . CATARACT EXTRACTION     Bilateral  . CERVICAL DISCECTOMY  2006  . ELECTROPHYSIOLOGIC STUDY N/A 01/18/2015   Procedure: A-Flutter/A-Tach/Svt Ablation;  Surgeon: [name], MD;  Location: [place] INVASIVE CV LAB CUPID;  Service: Cardiovascular;  Laterality: N/A;  . KNEE ARTHROSCOPY WITH MEDIAL MENISECTOMY Left 06/03/2015   Procedure: KNEE ARTHROSCOPY WITH MEDIAL MENISECTOMY;  Surgeon: [name], MD;  Location: [facility];  Service: Orthopedics;  Laterality: Left;  . PACEMAKER INSERTION  1966    Family History  Problem Relation Age of Onset  . Hypertension Unknown   . Neuropathy Neg Hx   . Stroke Neg Hx    Social History:  reports that he quit smoking about 54 years ago. His smoking use included Cigarettes. He has a 0.50 pack-year smoking history. He has  never used smokeless tobacco. He reports that he does not drink alcohol or use drugs.  Allergies:  Allergies  Allergen Reactions  . Bee Venom Anaphylaxis and Swelling  . Shellfish Allergy Hives    Accelerated allergic reaction; questionable laryngospasm or laryngeal edema with some difficulty breathing prompting ED evaluation and treatment at [facility]  . Penicillins Rash    Has patient had a PCN reaction causing immediate rash, facial/tongue/throat swelling, SOB or lightheadedness with hypotension: No Has patient had a PCN reaction causing severe rash involving mucus membranes or skin necrosis: No Has patient had a PCN reaction that required hospitalization No Has patient had a PCN reaction occurring within the last 10 years: No If all of the above answers are "NO", then may proceed with Cephalosporin use.     Medications Prior to Admission  Medication Sig Dispense Refill  . acetaminophen (TYLENOL) 500 MG tablet Take 500 mg by mouth every 6 (six) hours as needed for mild pain.    Marland Kitchen apixaban (ELIQUIS) 5 MG TABS tablet Take 1 tablet (5 mg total) by mouth 2 (two) times daily. 60 tablet 1  . Calcium-Magnesium-Zinc (CAL-MAG-ZINC PO) Take 1 tablet by mouth daily. Calcium 1000 mg Magnesium  400 mg Zinc  25 mg    . Cholecalciferol (VITAMIN D-3 PO) Take 2,000 Units by mouth daily.    Marland Kitchen diltiazem (TIAZAC) 120 MG 24 hr capsule Take 120 mg  by mouth every evening.     . diltiazem (TIAZAC) 300 MG 24 hr capsule Take 300 mg by mouth daily.     . flecainide (TAMBOCOR) 100 MG tablet Take 1.5 tablets (150 mg total) by mouth 2 (two) times daily. 270 tablet 3  . furosemide (LASIX) 40 MG tablet Take 40 mg by mouth daily.     Marland Kitchen gabapentin (NEURONTIN) 300 MG capsule Take 300-600 mg by mouth 2 (two) times daily. 300 mg in the morning and 600 mg in the evening    . hydrocortisone (ANUSOL-HC) 2.5 % rectal cream Place 1 application rectally as needed for hemorrhoids or itching.     . lidocaine (LIDODERM) 5 %  Place 1 patch onto the skin daily. Remove & Discard patch within 12 hours or as directed by MD    . loratadine (CLARITIN) 10 MG tablet Take 10 mg by mouth daily.    . metoprolol tartrate (LOPRESSOR) 25 MG tablet Take 0.5 tablets (12.5 mg total) by mouth 2 (two) times daily. 90 tablet 3  . morphine (MS CONTIN) 15 MG 12 hr tablet Take 15 mg by mouth every 12 (twelve) hours.    . pantoprazole (PROTONIX) 40 MG tablet Take 40 mg by mouth daily.    . polyethylene glycol (MIRALAX / GLYCOLAX) packet Take 17 g by mouth daily.    . psyllium (METAMUCIL) 58.6 % powder Take 1 packet by mouth daily.     . Tamsulosin HCl (FLOMAX) 0.4 MG CAPS Take 0.4 mg by mouth daily.     . traMADol (ULTRAM) 50 MG tablet TAKE ONE TO TWO TABLETS BY MOUTH EVERY 6 HOURS AS NEEDED *MAXIMUM 400 MG A DAY* (Patient not taking: Reported on 01/17/2017) 90 tablet 1    No results found for this or any previous visit (from the past 48 hour(s)). No results found.  ROS  Blood pressure 125/74, temperature 97.4 F (36.3 C), temperature source Oral, resp. rate 20, SpO2 95 %. Physical Exam  Constitutional: He appears well-developed and well-nourished.  HENT:  Mouth/Throat: Oropharynx is clear and moist.  Eyes: Conjunctivae are normal. No scleral icterus.  Neck: No thyromegaly present.  Cardiovascular: Normal rate, regular rhythm and normal heart sounds.   No murmur heard. Respiratory: Effort normal and breath sounds normal.  GI:  Abdomen is full but soft and nontender without organomegaly or masses.  Musculoskeletal: He exhibits no edema.  Lymphadenopathy:    He has no cervical adenopathy.  Neurological: He is alert.  Skin: Skin is warm.     Assessment/Plan History of colonic adenomas. Surveillance colonoscopy.  Hildred Laser, MD 01/26/2017, 7:36 AM

## 2017-01-26 NOTE — Transfer of Care (Signed)
Immediate Anesthesia Transfer of Care Note  Patient: [name].  Procedure(s) Performed: Procedure(s) with comments: COLONOSCOPY WITH PROPOFOL (N/A) - 8:30 POLYPECTOMY - colon  Patient Location: PACU  Anesthesia Type:MAC  Level of Consciousness: awake and alert   Airway & Oxygen Therapy: Patient Spontanous Breathing  Post-op Assessment: Report given to RN  Post vital signs: Reviewed and stable  Last Vitals:  Vitals:   01/26/17 0733 01/26/17 0910  BP: 125/74 (!) 99/57  Pulse:  70  Resp: 20 13  Temp:  36.7 C    Last Pain:  Vitals:   01/26/17 0828  TempSrc:   PainSc: 6          Complications: No apparent anesthesia complications

## 2017-02-01 ENCOUNTER — Encounter (HOSPITAL_COMMUNITY): Payer: Self-pay | Admitting: Internal Medicine

## 2017-03-30 ENCOUNTER — Encounter (HOSPITAL_COMMUNITY): Payer: Self-pay | Admitting: Emergency Medicine

## 2017-03-30 ENCOUNTER — Emergency Department (HOSPITAL_COMMUNITY): Payer: Medicare Other

## 2017-03-30 ENCOUNTER — Emergency Department (HOSPITAL_COMMUNITY)
Admission: EM | Admit: 2017-03-30 | Discharge: 2017-03-30 | Disposition: A | Discharge status: Home / Self Care | Payer: Medicare Other | Attending: Emergency Medicine | Admitting: Emergency Medicine

## 2017-03-30 DIAGNOSIS — Z79899 Other long term (current) drug therapy: Secondary | ICD-10-CM | POA: Insufficient documentation

## 2017-03-30 DIAGNOSIS — F17210 Nicotine dependence, cigarettes, uncomplicated: Secondary | ICD-10-CM | POA: Insufficient documentation

## 2017-03-30 DIAGNOSIS — Z7901 Long term (current) use of anticoagulants: Secondary | ICD-10-CM | POA: Insufficient documentation

## 2017-03-30 DIAGNOSIS — I509 Heart failure, unspecified: Secondary | ICD-10-CM | POA: Diagnosis not present

## 2017-03-30 DIAGNOSIS — Y929 Unspecified place or not applicable: Secondary | ICD-10-CM | POA: Insufficient documentation

## 2017-03-30 DIAGNOSIS — R51 Headache: Secondary | ICD-10-CM | POA: Diagnosis not present

## 2017-03-30 DIAGNOSIS — W19XXXA Unspecified fall, initial encounter: Secondary | ICD-10-CM

## 2017-03-30 DIAGNOSIS — Z043 Encounter for examination and observation following other accident: Secondary | ICD-10-CM | POA: Diagnosis not present

## 2017-03-30 DIAGNOSIS — Y998 Other external cause status: Secondary | ICD-10-CM | POA: Diagnosis not present

## 2017-03-30 DIAGNOSIS — W010XXA Fall on same level from slipping, tripping and stumbling without subsequent striking against object, initial encounter: Secondary | ICD-10-CM | POA: Diagnosis not present

## 2017-03-30 DIAGNOSIS — I10 Essential (primary) hypertension: Secondary | ICD-10-CM | POA: Diagnosis not present

## 2017-03-30 DIAGNOSIS — I4892 Unspecified atrial flutter: Secondary | ICD-10-CM | POA: Diagnosis not present

## 2017-03-30 DIAGNOSIS — Y9389 Activity, other specified: Secondary | ICD-10-CM | POA: Diagnosis not present

## 2017-03-30 HISTORY — DX: Heart failure, unspecified: I50.9

## 2017-03-30 NOTE — Discharge Instructions (Signed)
Take tylenol for pain and follow up if any problems

## 2017-03-30 NOTE — ED Triage Notes (Signed)
Tripped and fell approx 1 hour ago.  Hit lt temple when he fell. Pt is on elioquis.

## 2017-03-30 NOTE — ED Provider Notes (Signed)
[facility] DEPT Provider Note   CSN: [id number] Arrival date & time: 03/30/17  2015     History   Chief Complaint Chief Complaint  Patient presents with  . Fall    HPI [name]. is a 76 y.o. male.   Patient states he fell and hit his head.  No loc   The history is provided by the patient. No language interpreter was used.  Fall  This is a new problem. The current episode started 6 to 12 hours ago. The problem occurs rarely. The problem has been resolved. Associated symptoms include headaches. Pertinent negatives include no chest pain and no abdominal pain. Nothing aggravates the symptoms. Nothing relieves the symptoms.    Past Medical History:  Diagnosis Date  . Atrial flutter ([place])    a. s/p ablation 01/2015.  Marland Kitchen BPH (benign prostatic hyperplasia)   . Cerebrovascular disease    carotid bruit; no focal stenosis by carotid ultrasound in 05/2008  . Congestive heart failure (CHF) ([place])   . Dysrhythmia   . General weakness    +malaise, exercise intolerance, and near syncope  . Hypertension   . Low back pain    history of traumax2  . Paroxysmal atrial fibrillation (HCC)   . Presence of permanent cardiac pacemaker 2009  . Sleep apnea    c pap at night  . Tachy-brady syndrome ([place])    a. s/p St. Jude pacemaker 2009.    Patient Active Problem List   Diagnosis Date Noted  . Hx of colonic polyps 12/28/2016  . Paroxysmal atrial fibrillation (HCC)   . Fatigue 11/18/2015  . Chronic anticoagulation 11/18/2015  . Atrial fibrillation ([place]) 03/15/2015  . Dyspnea 12/25/2014  . OSA (obstructive sleep apnea) 09/02/2013  . Atrial flutter ([place]) 11/28/2012  . Hypertension 11/21/2011  . Sick sinus syndrome ([place]) 06/13/2011  . HYPERLIPIDEMIA 02/17/2010  . ORTHOSTATIC DIZZINESS 02/17/2010  . CHEST PAIN 02/17/2010  . PPM-St.Jude 06/16/2009  . LOW BACK PAIN, CHRONIC 07/31/2008  . Cerebrovascular disease 07/31/2008  . BENIGN PROSTATIC HYPERTROPHY, MILD, HX OF  07/31/2008    Past Surgical History:  Procedure Laterality Date  . ABLATION    . CATARACT EXTRACTION     Bilateral  . CERVICAL DISCECTOMY  2006  . COLONOSCOPY WITH PROPOFOL N/A 01/26/2017   Procedure: COLONOSCOPY WITH PROPOFOL;  Surgeon: [name], MD;  [facility];  Service: Endoscopy;  Laterality: N/A;  8:30  . ELECTROPHYSIOLOGIC STUDY N/A 01/18/2015   Procedure: A-Flutter/A-Tach/Svt Ablation;  Surgeon: [name], MD;  Location: [place] INVASIVE CV LAB CUPID;  Service: Cardiovascular;  Laterality: N/A;  . KNEE ARTHROSCOPY WITH MEDIAL MENISECTOMY Left 06/03/2015   Procedure: KNEE ARTHROSCOPY WITH MEDIAL MENISECTOMY;  Surgeon: [name], MD;  Location: [facility];  Service: Orthopedics;  Laterality: Left;  . PACEMAKER INSERTION  1966  . POLYPECTOMY  01/26/2017   Procedure: POLYPECTOMY;  Surgeon: [name], MD;  [facility];  Service: Endoscopy;;  colon       Home Medications    Prior to Admission medications   Medication Sig Start Date End Date Taking? Authorizing Provider  acetaminophen (TYLENOL) 500 MG tablet Take 500 mg by mouth every 6 (six) hours as needed for mild pain.    [provider]  apixaban (ELIQUIS) 5 MG TABS tablet Take 1 tablet (5 mg total) by mouth 2 (two) times daily. 08/09/16   Erlene Quan, PA-C  Calcium-Magnesium-Zinc (CAL-MAG-ZINC PO) Take 1 tablet by mouth daily. Calcium 1000  mg Magnesium  400 mg Zinc  25 mg    [provider]  Cholecalciferol (VITAMIN D-3 PO) Take 2,000 Units by mouth daily.    [provider]  diltiazem (TIAZAC) 120 MG 24 hr capsule Take 120 mg by mouth every evening.     [provider]  diltiazem (TIAZAC) 300 MG 24 hr capsule Take 300 mg by mouth daily.     [provider]  flecainide (TAMBOCOR) 100 MG tablet Take 1.5 tablets (150 mg total) by mouth 2 (two) times daily. 05/26/16   [name], MD  furosemide (LASIX) 40 MG tablet Take 40 mg by mouth daily.   01/08/17   [provider]  gabapentin (NEURONTIN) 300 MG capsule Take 300-600 mg by mouth 2 (two) times daily. 300 mg in the morning and 600 mg in the evening    [provider]  hydrocortisone (ANUSOL-HC) 2.5 % rectal cream Place 1 application rectally as needed for hemorrhoids or itching.     [provider]  lidocaine (LIDODERM) 5 % Place 1 patch onto the skin daily. Remove & Discard patch within 12 hours or as directed by MD    [provider]  loratadine (CLARITIN) 10 MG tablet Take 10 mg by mouth daily.    [provider]  metoprolol tartrate (LOPRESSOR) 25 MG tablet Take 0.5 tablets (12.5 mg total) by mouth 2 (two) times daily. 01/01/15   [name], MD  morphine (MS CONTIN) 15 MG 12 hr tablet Take 15 mg by mouth every 12 (twelve) hours.    [provider]  pantoprazole (PROTONIX) 40 MG tablet Take 40 mg by mouth daily.    [provider]  polyethylene glycol (MIRALAX / GLYCOLAX) packet Take 17 g by mouth daily.    [provider]  psyllium (METAMUCIL) 58.6 % powder Take 1 packet by mouth daily.     [provider]  Tamsulosin HCl (FLOMAX) 0.4 MG CAPS Take 0.4 mg by mouth daily.     [provider]    Family History Family History  Problem Relation Age of Onset  . Hypertension Unknown   . Neuropathy Neg Hx   . Stroke Neg Hx     Social History Social History  Substance Use Topics  . Smoking status: Former Smoker    Packs/day: 1.00    Years: 0.50    Types: Cigarettes    Quit date: 03/27/1962  . Smokeless tobacco: Never Used  . Alcohol use No     Allergies   Bee venom; Shellfish allergy; and Penicillins   Review of Systems Review of Systems  Constitutional: Negative for appetite change and fatigue.  HENT: Negative for congestion, ear discharge and sinus pressure.   Eyes: Negative for discharge.  Respiratory: Negative for cough.   Cardiovascular: Negative for chest pain.    Gastrointestinal: Negative for abdominal pain and diarrhea.  Genitourinary: Negative for frequency and hematuria.  Musculoskeletal: Negative for back pain.  Skin: Negative for rash.  Neurological: Positive for headaches. Negative for seizures.  Psychiatric/Behavioral: Negative for hallucinations.     Physical Exam Updated Vital Signs BP 131/69 (BP Location: Right Arm)   Pulse 71   Temp 98.2 F (36.8 C) (Oral)   Resp 18   Ht 5\' 8"  (1.727 m)   Wt 110.7 kg (244 lb)   SpO2 98%   BMI 37.10 kg/m   Physical Exam  Constitutional: He is oriented to person, place, and time. He appears well-developed.  HENT:  Head: Normocephalic.  Eyes: Conjunctivae and EOM are normal. No scleral icterus.  Neck: Neck supple. No thyromegaly present.  Cardiovascular: Normal rate and regular rhythm.  Exam reveals no gallop and no friction rub.   No murmur heard. Pulmonary/Chest: No stridor. He has no wheezes. He has no rales. He exhibits no tenderness.  Abdominal: He exhibits no distension. There is no tenderness. There is no rebound.  Musculoskeletal: Normal range of motion. He exhibits no edema.  Lymphadenopathy:    He has no cervical adenopathy.  Neurological: He is oriented to person, place, and time. He exhibits normal muscle tone. Coordination normal.  Skin: No rash noted. No erythema.  Psychiatric: He has a normal mood and affect. His behavior is normal.     ED Treatments / Results  Labs (all labs ordered are listed, but only abnormal results are displayed) Labs Reviewed - No data to display  EKG  EKG Interpretation None       Radiology Ct Head Wo Contrast  Result Date: 03/30/2017 CLINICAL DATA:  Trip and fall striking head. On anticoagulation. Headache and dizziness. EXAM: CT HEAD WITHOUT CONTRAST TECHNIQUE: Contiguous axial images were obtained from the base of the skull through the vertex without intravenous contrast. COMPARISON:  None. FINDINGS: Brain: Generalized atrophy,  normal for age. Mild to moderate chronic small vessel ischemia.No intracranial hemorrhage, mass effect, or midline shift. No hydrocephalus. The basilar cisterns are patent. No evidence of territorial infarct. No extra-axial or intracranial fluid collection. Vascular: Atherosclerosis of skullbase vasculature without hyperdense vessel or abnormal calcification. Skull: No fracture.  No focal lesion. Sinuses/Orbits: Paranasal sinuses and mastoid air cells are clear. The visualized orbits are unremarkable. Bilateral cataract resection. Other: None. IMPRESSION: 1.  No acute intracranial abnormality.  No skull fracture. 2. Age related atrophy and mild to moderate chronic small vessel ischemia. Electronically Signed   By: Jeb Levering M.D.   On: 03/30/2017 21:18    Procedures Procedures (including critical care time)  Medications Ordered in ED Medications - No data to display   Initial Impression / Assessment and Plan / ED Course  I have reviewed the triage vital signs and the nursing notes.  Pertinent labs & imaging results that were available during my care of the patient were reviewed by me and considered in my medical decision making (see chart for details).     Pt with mild contusion to skull,  Ct head neg.  Pt will take tylenol and follow up as needed  Final Clinical Impressions(s) / ED Diagnoses   Final diagnoses:  Fall, initial encounter    New Prescriptions New Prescriptions   No medications on file     Milton Ferguson, MD 03/30/17 2143

## 2017-04-05 DIAGNOSIS — I10 Essential (primary) hypertension: Secondary | ICD-10-CM | POA: Diagnosis not present

## 2017-04-05 DIAGNOSIS — I5022 Chronic systolic (congestive) heart failure: Secondary | ICD-10-CM | POA: Diagnosis not present

## 2017-04-05 DIAGNOSIS — I480 Paroxysmal atrial fibrillation: Secondary | ICD-10-CM | POA: Diagnosis not present

## 2017-05-01 DIAGNOSIS — I5032 Chronic diastolic (congestive) heart failure: Secondary | ICD-10-CM | POA: Diagnosis not present

## 2017-05-01 DIAGNOSIS — I480 Paroxysmal atrial fibrillation: Secondary | ICD-10-CM | POA: Diagnosis not present

## 2017-05-01 DIAGNOSIS — Z79899 Other long term (current) drug therapy: Secondary | ICD-10-CM | POA: Diagnosis not present

## 2017-05-01 DIAGNOSIS — I10 Essential (primary) hypertension: Secondary | ICD-10-CM | POA: Diagnosis not present

## 2017-05-15 DIAGNOSIS — Z9181 History of falling: Secondary | ICD-10-CM | POA: Diagnosis not present

## 2017-05-28 ENCOUNTER — Telehealth: Payer: Self-pay | Admitting: Internal Medicine

## 2017-05-28 NOTE — Telephone Encounter (Signed)
Patient states that he has been having chest pain for past couple days. Please call patient for further description of symptoms. / tg

## 2017-05-28 NOTE — Telephone Encounter (Signed)
Returned pt call, he complains that for the past several weeks he has had some chest pains on and off. He states it last for about 15 minutes before it stops, and feels like a mild sharp pain. It started on the right side below his breastbone at first and now it is more to the left side. He states that on a scale of 1-10, his pain is about a 3-4 when it happens. He stated he has had only a few episodes, but his daughter is wanting him to get checked out. He does not want to go to the ED, and his PCP has no availability for weeks. I gave him the first available appointment Sept. 19th with Ermalinda Barrios, PA. I did advise him that if his symptoms get worse then he needs to be evaluated in the emergency department. He voiced understanding.

## 2017-06-05 NOTE — Progress Notes (Deleted)
Cardiology Office Note    Date:  06/05/2017   ID:  Gregory Dark., DOB 18-Sep-1941, MRN [id number]  PCP:  [name], MD  Cardiologist: Dr. Lovena Le  No chief complaint on file.   History of Present Illness:  Gregory Mittag. is a 76 y.o. male with history of symptomatic bradycardia, PAF, persistent atrial flutter status post permanent pacemaker insertion and catheter ablation. Has maintained normal sinus rhythm mostly on high-dose flecainide. He has atrial fibrillation about 30% of the time according to Dr. Tanna Furry last note 01/15/17. Patient also has borderline hypertension and OSA on C Pap. Because of low blood pressure Dr. Lovena Le concert stopping his metoprolol but wanted him to stay on both beta blocker and calcium channel blocker at that time. Also had acute on chronic diastolic CHF improved with Lasix for a couple days.    Past Medical History:  Diagnosis Date  . Atrial flutter ([place])    a. s/p ablation 01/2015.  Marland Kitchen BPH (benign prostatic hyperplasia)   . Cerebrovascular disease    carotid bruit; no focal stenosis by carotid ultrasound in 05/2008  . Congestive heart failure (CHF) ([place])   . Dysrhythmia   . General weakness    +malaise, exercise intolerance, and near syncope  . Hypertension   . Low back pain    history of traumax2  . Paroxysmal atrial fibrillation (HCC)   . Presence of permanent cardiac pacemaker 2009  . Sleep apnea    c pap at night  . Tachy-brady syndrome ([place])    a. s/p St. Jude pacemaker 2009.    Past Surgical History:  Procedure Laterality Date  . ABLATION    . CATARACT EXTRACTION     Bilateral  . CERVICAL DISCECTOMY  2006  . COLONOSCOPY WITH PROPOFOL N/A 01/26/2017   Procedure: COLONOSCOPY WITH PROPOFOL;  Surgeon: [name], MD;  [facility];  Service: Endoscopy;  Laterality: N/A;  8:30  . ELECTROPHYSIOLOGIC STUDY N/A 01/18/2015   Procedure: A-Flutter/A-Tach/Svt Ablation;  Surgeon: [name], MD;  Location: [facility]  INVASIVE CV LAB CUPID;  Service: Cardiovascular;  Laterality: N/A;  . KNEE ARTHROSCOPY WITH MEDIAL MENISECTOMY Left 06/03/2015   Procedure: KNEE ARTHROSCOPY WITH MEDIAL MENISECTOMY;  Surgeon: [name], MD;  Location: [facility];  Service: Orthopedics;  Laterality: Left;  . PACEMAKER INSERTION  1966  . POLYPECTOMY  01/26/2017   Procedure: POLYPECTOMY;  Surgeon: [name], MD;  [facility];  Service: Endoscopy;;  colon    Current Medications: No outpatient prescriptions have been marked as taking for the 06/06/17 encounter (Appointment) with Imogene Burn, PA-C.     Allergies:   Bee venom; Shellfish allergy; and Penicillins   Social History   Social History  . Marital status: Divorced    Spouse name: N/A  . Number of children: 3  . Years of education: 12+   Occupational History  . Retired    Social History Main Topics  . Smoking status: Former Smoker    Packs/day: 1.00    Years: 0.50    Types: Cigarettes    Quit date: 03/27/1962  . Smokeless tobacco: Never Used  . Alcohol use No  . Drug use: No  . Sexual activity: Not on file   Other Topics Concern  . Not on file   Social History Narrative   Divorced with 3 adult children. Semi retired.    Caffeine use: once daily        Family History:  The patient's ***family history includes Hypertension in his unknown relative.   ROS:   Please see the history of present illness.    ROS All other systems reviewed and are negative.   PHYSICAL EXAM:   VS:  There were no vitals taken for this visit.  Physical Exam  GEN: Well nourished, well developed, in no acute distress  HEENT: normal  Neck: no JVD, carotid bruits, or masses Cardiac:RRR; no murmurs, rubs, or gallops  Respiratory:  clear to auscultation bilaterally, normal work of breathing GI: soft, nontender, nondistended, + BS Ext: without cyanosis, clubbing, or edema, Good distal pulses bilaterally MS: no deformity or atrophy  Skin: warm and dry, no  rash Neuro:  Alert and Oriented x 3, Strength and sensation are intact Psych: euthymic mood, full affect  Wt Readings from Last 3 Encounters:  03/30/17 244 lb (110.7 kg)  01/26/17 245 lb (111.1 kg)  01/15/17 245 lb 9.6 oz (111.4 kg)      Studies/Labs Reviewed:   EKG:  EKG is*** ordered today.  The ekg ordered today demonstrates ***  Recent Labs: 09/03/2016: Hemoglobin 13.4; Platelets 173 01/15/2017: BUN 20; Creatinine, Ser 1.33; Potassium 3.9; Sodium 136   Lipid Panel    Component Value Date/Time   CHOL 175 04/19/2012 1112   TRIG 157 (H) 04/19/2012 1112   HDL 40 04/19/2012 1112   CHOLHDL 4.4 04/19/2012 1112   VLDL 31 04/19/2012 1112   LDLCALC 104 (H) 04/19/2012 1112    Additional studies/ records that were reviewed today include:  ***    ASSESSMENT:    1. Paroxysmal atrial fibrillation (HCC)   2. Typical atrial flutter ([place])   3. Essential hypertension   4. PPM-St.Jude   5. Chronic anticoagulation      PLAN:  In order of problems listed above:      Medication Adjustments/Labs and Tests Ordered: Current medicines are reviewed at length with the patient today.  Concerns regarding medicines are outlined above.  Medication changes, Labs and Tests ordered today are listed in the Patient Instructions below. There are no Patient Instructions on file for this visit.   Sumner Boast, PA-C  06/05/2017 3:03 PM    [facility] [address], [place], [place]  [address] Phone: [phone]; Fax: [phone]

## 2017-06-06 ENCOUNTER — Encounter: Payer: Self-pay | Admitting: *Deleted

## 2017-06-06 ENCOUNTER — Ambulatory Visit (INDEPENDENT_AMBULATORY_CARE_PROVIDER_SITE_OTHER): Payer: Medicare Other | Admitting: Physician Assistant

## 2017-06-06 ENCOUNTER — Ambulatory Visit: Payer: Medicare Other | Admitting: Physician Assistant

## 2017-06-06 ENCOUNTER — Encounter: Payer: Self-pay | Admitting: Physician Assistant

## 2017-06-06 VITALS — BP 120/66 | HR 70 | Ht 68.0 in | Wt 248.0 lb

## 2017-06-06 DIAGNOSIS — Z950 Presence of cardiac pacemaker: Secondary | ICD-10-CM

## 2017-06-06 DIAGNOSIS — I483 Typical atrial flutter: Secondary | ICD-10-CM | POA: Diagnosis not present

## 2017-06-06 DIAGNOSIS — I480 Paroxysmal atrial fibrillation: Secondary | ICD-10-CM

## 2017-06-06 DIAGNOSIS — I10 Essential (primary) hypertension: Secondary | ICD-10-CM

## 2017-06-06 DIAGNOSIS — R079 Chest pain, unspecified: Secondary | ICD-10-CM | POA: Diagnosis not present

## 2017-06-06 DIAGNOSIS — I5032 Chronic diastolic (congestive) heart failure: Secondary | ICD-10-CM

## 2017-06-06 NOTE — Progress Notes (Signed)
Cardiology Office Note    Date:  06/06/2017   ID:  [name]., DOB [birth year], MRN [id number]  PCP:  [name], MD  Cardiologist:  Dr. Lovena Le  Chief Complaint  Patient presents with  . Chest Pain    History of Present Illness:  [name]. is a 76 y.o. male with history of symptomatic bradycardia, PAF and persistent atrial flutter status post pacemaker and catheter ablation of atrial flutter. Has maintained normal sinus rhythm most of the time on high-dose flecainide but has atrial fibrillation about 30% of the time. Also has chronic diastolic CHF, hypertension. Saw Dr. Lovena Le 01/15/17 at which time he was doing well and vice was working normally.  Patient was added onto my schedule today for complaints of chest pain. Patient states he's had sharp shooting chest pains that occur after eating especially certain foods like fried foods. It has worsened over the past couple months. He has since changed his diet and is doing a lot better. He says he hasn't had symptoms in 2 weeks. He is on Protonix. He is out a lot. He takes Lasix once every 2 weeks for swelling. He is also concerned because he walks his dog around his house and has become very weak and short of breath when doing this. He thinks it may be related to the heat and humidity but is worried it could be his heart weakening. He had an echo at the [facility] in [place] sometime this year but we do not have a copy of this. It's been years since he has stress test. He doesn't exercise much because of chronic back pain. He knows he has to lose weight. Clearly denies chest tightness, pressure, diaphoresis, arm pain or dyspnea with this chest pain. No dizziness or presyncope.    Past Medical History:  Diagnosis Date  . Atrial flutter ([place])    a. s/p ablation 01/2015.  Marland Kitchen BPH (benign prostatic hyperplasia)   . Cerebrovascular disease    carotid bruit; no focal stenosis by carotid ultrasound in 05/2008  . Congestive heart  failure (CHF) ([place])   . Dysrhythmia   . General weakness    +malaise, exercise intolerance, and near syncope  . Hypertension   . Low back pain    history of traumax2  . Paroxysmal atrial fibrillation (HCC)   . Presence of permanent cardiac pacemaker 2009  . Sleep apnea    c pap at night  . Tachy-brady syndrome ([place])    a. s/p St. Jude pacemaker 2009.    Past Surgical History:  Procedure Laterality Date  . ABLATION    . CATARACT EXTRACTION     Bilateral  . CERVICAL DISCECTOMY  2006  . COLONOSCOPY WITH PROPOFOL N/A 01/26/2017   Procedure: COLONOSCOPY WITH PROPOFOL;  Surgeon: [name], MD;  [facility];  Service: Endoscopy;  Laterality: N/A;  8:30  . ELECTROPHYSIOLOGIC STUDY N/A 01/18/2015   Procedure: A-Flutter/A-Tach/Svt Ablation;  Surgeon: [name], MD;  Location: [facility];  Service: Cardiovascular;  Laterality: N/A;  . KNEE ARTHROSCOPY WITH MEDIAL MENISECTOMY Left 06/03/2015   Procedure: KNEE ARTHROSCOPY WITH MEDIAL MENISECTOMY;  Surgeon: [name], MD;  Location: [facility];  Service: Orthopedics;  Laterality: Left;  . PACEMAKER INSERTION  1966  . POLYPECTOMY  01/26/2017   Procedure: POLYPECTOMY;  Surgeon: [name], MD;  [facility];  Service: Endoscopy;;  colon    Current Medications: Current Meds  Medication Sig  .  acetaminophen (TYLENOL) 500 MG tablet Take 500 mg by mouth every 6 (six) hours as needed for mild pain.  Marland Kitchen apixaban (ELIQUIS) 5 MG TABS tablet Take 1 tablet (5 mg total) by mouth 2 (two) times daily.  . Calcium-Magnesium-Zinc (CAL-MAG-ZINC PO) Take 1 tablet by mouth daily. Calcium 1000 mg Magnesium  400 mg Zinc  25 mg  . Cholecalciferol (VITAMIN D-3 PO) Take 2,000 Units by mouth daily.  Marland Kitchen diltiazem (TIAZAC) 120 MG 24 hr capsule Take 120 mg by mouth every evening.   . diltiazem (TIAZAC) 300 MG 24 hr capsule Take 300 mg by mouth daily.   . flecainide (TAMBOCOR) 100 MG tablet Take 1.5 tablets (150 mg  total) by mouth 2 (two) times daily.  . furosemide (LASIX) 40 MG tablet Take 40 mg by mouth daily.   Marland Kitchen gabapentin (NEURONTIN) 300 MG capsule Take 300-600 mg by mouth 2 (two) times daily. 300 mg in the morning and 600 mg in the evening  . hydrocortisone (ANUSOL-HC) 2.5 % rectal cream Place 1 application rectally as needed for hemorrhoids or itching.   . lidocaine (LIDODERM) 5 % Place 1 patch onto the skin daily. Remove & Discard patch within 12 hours or as directed by MD  . loratadine (CLARITIN) 10 MG tablet Take 10 mg by mouth daily.  . metoprolol tartrate (LOPRESSOR) 25 MG tablet Take 0.5 tablets (12.5 mg total) by mouth 2 (two) times daily.  Marland Kitchen morphine (MS CONTIN) 15 MG 12 hr tablet Take 15 mg by mouth every 12 (twelve) hours.  . pantoprazole (PROTONIX) 40 MG tablet Take 40 mg by mouth daily.  . polyethylene glycol (MIRALAX / GLYCOLAX) packet Take 17 g by mouth daily.  . psyllium (METAMUCIL) 58.6 % powder Take 1 packet by mouth daily.   . Tamsulosin HCl (FLOMAX) 0.4 MG CAPS Take 0.4 mg by mouth daily.      Allergies:   Bee venom; Shellfish allergy; and Penicillins   Social History   Social History  . Marital status: Divorced    Spouse name: N/A  . Number of children: 3  . Years of education: 12+   Occupational History  . Retired    Social History Main Topics  . Smoking status: Former Smoker    Packs/day: 1.00    Years: 0.50    Types: Cigarettes    Quit date: 03/27/1962  . Smokeless tobacco: Never Used  . Alcohol use No  . Drug use: No  . Sexual activity: Not Asked   Other Topics Concern  . None   Social History Narrative   Divorced with 3 adult children. Semi retired.    Caffeine use: once daily        Family History:  The patient's family history includes Hypertension in his unknown relative.   ROS:   Please see the history of present illness.    Review of Systems  Constitution: Positive for weakness, malaise/fatigue and weight gain.  HENT: Negative.     Cardiovascular: Positive for chest pain and dyspnea on exertion.  Endocrine: Negative.   Hematologic/Lymphatic: Negative.   Musculoskeletal: Negative.   Gastrointestinal: Positive for heartburn.  Genitourinary: Negative.    All other systems reviewed and are negative.   PHYSICAL EXAM:   VS:  BP 120/66   Pulse 70   Ht 5\' 8"  (1.727 m)   Wt 248 lb (112.5 kg)   SpO2 97%   BMI 37.71 kg/m   Physical Exam  GEN: Obese, in no acute distress  Neck: no JVD,  carotid bruits, or masses Cardiac:RRR; 1/6 systolic murmur at the left sternal border  Respiratory:  clear to auscultation bilaterally, normal work of breathing GI: soft, nontender, nondistended, + BS Ext: without cyanosis, clubbing, or edema, Good distal pulses bilaterally Neuro:  Alert and Oriented x 3 Psych: euthymic mood, full affect  Wt Readings from Last 3 Encounters:  06/06/17 248 lb (112.5 kg)  03/30/17 244 lb (110.7 kg)  01/26/17 245 lb (111.1 kg)      Studies/Labs Reviewed:   EKG:  EKG is  ordered today.  The ekg ordered today demonstrates Ventricular paced rhythm  Recent Labs: 09/03/2016: Hemoglobin 13.4; Platelets 173 01/15/2017: BUN 20; Creatinine, Ser 1.33; Potassium 3.9; Sodium 136   Lipid Panel    Component Value Date/Time   CHOL 175 04/19/2012 1112   TRIG 157 (H) 04/19/2012 1112   HDL 40 04/19/2012 1112   CHOLHDL 4.4 04/19/2012 1112   VLDL 31 04/19/2012 1112   LDLCALC 104 (H) 04/19/2012 1112    Additional studies/ records that were reviewed today include:    Stress test 03/29/2015 ischemia could not be ruled out ischemia based on exercise testing as he didn't reach his target heart rate. I believe it was done because of flecainide.   ASSESSMENT:    1. Chest pain, unspecified type   2. Paroxysmal atrial fibrillation (HCC)   3. Typical atrial flutter ([place])   4. Essential hypertension   5. PPM-St.Jude   6. Chronic diastolic CHF (congestive heart failure) (HCC)      PLAN:  In order of  problems listed above:  Chest pain at rest and related to meals and foods he eats. Has improved since he's quit eating fried foods. Also has dyspnea on exertion and fatigue. Has not had stress test in a long time. We'll order a Lexi scan Myoview to rule out ischemia. Suspect his chest pain is GI related. Recommend change in diet, no fast foods, weight loss program such as Weight Watchers. Obtain Echo from the [facility]. Follow-up with Dr. Lovena Le  PAF on flecainide 150 mg twice a day and Eliquis. In normal sinus rhythm most of the time but 30% at time is in A. Fib  Typical atrial flutter status post ablation  St. Jude pacemaker functioning normally 12/2016 and falls regularly by Dr. Lovena Le  Chronic diastolic heart failure managed with Lasix approximately every other week. 2 g sodium diet recommended.  Hypertension blood pressure stable       Medication Adjustments/Labs and Tests Ordered: Current medicines are reviewed at length with the patient today.  Concerns regarding medicines are outlined above.  Medication changes, Labs and Tests ordered today are listed in the Patient Instructions below. Patient Instructions  Medication Instructions:  Your physician recommends that you continue on your current medications as directed. Please refer to the Current Medication list given to you today.   Labwork: NONE   Testing/Procedures: Your physician has requested that you have a lexiscan myoview. For further information please visit [email or web address]. Please follow instruction sheet, as given.    Follow-Up: Your physician recommends that you schedule a follow-up appointment after Stress Test    Any Other Special Instructions Will Be Listed Below (If Applicable).     If you need a refill on your cardiac medications before your next appointment, please call your pharmacy. Thank you for choosing [facility]!       Sumner Boast, PA-C  06/06/2017 12:25 PM    [facility]  [address], [place], [place]  [address] Phone: [phone]; Fax: [phone]

## 2017-06-06 NOTE — Patient Instructions (Signed)
Medication Instructions:  Your physician recommends that you continue on your current medications as directed. Please refer to the Current Medication list given to you today.   Labwork: NONE   Testing/Procedures: Your physician has requested that you have a lexiscan myoview. For further information please visit [email or web address]. Please follow instruction sheet, as given.    Follow-Up: Your physician recommends that you schedule a follow-up appointment after Stress Test    Any Other Special Instructions Will Be Listed Below (If Applicable).     If you need a refill on your cardiac medications before your next appointment, please call your pharmacy. Thank you for choosing [facility]!

## 2017-06-13 ENCOUNTER — Encounter (HOSPITAL_COMMUNITY)
Admission: RE | Admit: 2017-06-13 | Discharge: 2017-06-13 | Disposition: A | Discharge status: Home / Self Care | Payer: Medicare Other | Source: Ambulatory Visit | Attending: Physician Assistant | Admitting: Physician Assistant

## 2017-06-13 ENCOUNTER — Encounter (HOSPITAL_BASED_OUTPATIENT_CLINIC_OR_DEPARTMENT_OTHER)
Admission: RE | Admit: 2017-06-13 | Discharge: 2017-06-13 | Disposition: A | Discharge status: Home / Self Care | Payer: Medicare Other | Source: Ambulatory Visit | Attending: Physician Assistant | Admitting: Physician Assistant

## 2017-06-13 ENCOUNTER — Encounter (HOSPITAL_COMMUNITY): Payer: Self-pay

## 2017-06-13 DIAGNOSIS — R079 Chest pain, unspecified: Secondary | ICD-10-CM | POA: Insufficient documentation

## 2017-06-13 LAB — NM MYOCAR MULTI W/SPECT W/WALL MOTION / EF
CHL CUP NUCLEAR SRS: 9
CHL CUP RESTING HR STRESS: 70 {beats}/min
CSEPPHR: 89 {beats}/min
LV dias vol: 153 mL (ref 62–150)
LVSYSVOL: 70 mL
RATE: 0.5
SDS: 4
SSS: 11
TID: 1.15

## 2017-06-13 MED ORDER — TECHNETIUM TC 99M TETROFOSMIN IV KIT
30.0000 | PACK | Freq: Once | INTRAVENOUS | Status: AC | PRN
  Administered 2017-06-13: 31 via INTRAVENOUS

## 2017-06-13 MED ORDER — REGADENOSON 0.4 MG/5ML IV SOLN
INTRAVENOUS | Status: AC
  Administered 2017-06-13: 0.4 mg via INTRAVENOUS
  Filled 2017-06-13: qty 5

## 2017-06-13 MED ORDER — TECHNETIUM TC 99M TETROFOSMIN IV KIT
10.0000 | PACK | Freq: Once | INTRAVENOUS | Status: AC | PRN
  Administered 2017-06-13: 10.4 via INTRAVENOUS

## 2017-06-13 MED ORDER — SODIUM CHLORIDE 0.9% FLUSH
INTRAVENOUS | Status: AC
  Administered 2017-06-13: 10 mL via INTRAVENOUS
  Filled 2017-06-13: qty 10

## 2017-06-15 ENCOUNTER — Telehealth: Payer: Self-pay

## 2017-06-15 DIAGNOSIS — R079 Chest pain, unspecified: Secondary | ICD-10-CM

## 2017-06-15 DIAGNOSIS — R931 Abnormal findings on diagnostic imaging of heart and coronary circulation: Secondary | ICD-10-CM

## 2017-06-15 NOTE — Telephone Encounter (Signed)
Echo ordered for Thursday Oct 4 th at 10:15 ,register at main entrance  Follow up with Gerrianne Scale PA-C  Wed Oct 17 th at 2 pm

## 2017-06-15 NOTE — Telephone Encounter (Signed)
-----   Message from Imogene Burn, PA-C sent at 06/15/2017  7:24 AM EDT ----- Regarding: RE: Echo from [facility] an echo for chest pain and abn myoview and have him f/u with me in a couple weeks. Put him in with Dr. Lovena Le in Jan. Thanks, Selinda Eon ----- Message ----- From: Levonne Hubert, LPN Sent: 7/40/8144  10:58 AM To: Imogene Burn, PA-C Subject: Echo from [facility] with [facility] in [place]. They state that pt has not had Echo. Last EKG was done in 2017. Dr. Lovena Le does noto have any openings in [place] until Jan.

## 2017-06-19 DIAGNOSIS — Z23 Encounter for immunization: Secondary | ICD-10-CM | POA: Diagnosis not present

## 2017-06-19 DIAGNOSIS — R05 Cough: Secondary | ICD-10-CM | POA: Diagnosis not present

## 2017-06-21 ENCOUNTER — Ambulatory Visit (HOSPITAL_COMMUNITY)
Admission: RE | Admit: 2017-06-21 | Discharge: 2017-06-21 | Disposition: A | Discharge status: Home / Self Care | Payer: Medicare Other | Source: Ambulatory Visit | Attending: Physician Assistant | Admitting: Physician Assistant

## 2017-06-21 DIAGNOSIS — R079 Chest pain, unspecified: Secondary | ICD-10-CM

## 2017-06-21 DIAGNOSIS — I10 Essential (primary) hypertension: Secondary | ICD-10-CM | POA: Diagnosis not present

## 2017-06-21 DIAGNOSIS — I4891 Unspecified atrial fibrillation: Secondary | ICD-10-CM | POA: Diagnosis not present

## 2017-06-21 DIAGNOSIS — Z87891 Personal history of nicotine dependence: Secondary | ICD-10-CM | POA: Insufficient documentation

## 2017-06-21 DIAGNOSIS — R0600 Dyspnea, unspecified: Secondary | ICD-10-CM | POA: Insufficient documentation

## 2017-06-21 DIAGNOSIS — E785 Hyperlipidemia, unspecified: Secondary | ICD-10-CM | POA: Insufficient documentation

## 2017-06-21 DIAGNOSIS — R931 Abnormal findings on diagnostic imaging of heart and coronary circulation: Secondary | ICD-10-CM | POA: Diagnosis not present

## 2017-06-21 DIAGNOSIS — I495 Sick sinus syndrome: Secondary | ICD-10-CM | POA: Diagnosis not present

## 2017-06-21 DIAGNOSIS — I340 Nonrheumatic mitral (valve) insufficiency: Secondary | ICD-10-CM | POA: Insufficient documentation

## 2017-06-21 DIAGNOSIS — I4892 Unspecified atrial flutter: Secondary | ICD-10-CM | POA: Insufficient documentation

## 2017-06-21 MED ORDER — PERFLUTREN LIPID MICROSPHERE
1.0000 mL | INTRAVENOUS | Status: AC | PRN
  Administered 2017-06-21: 2 mL via INTRAVENOUS
  Filled 2017-06-21: qty 10

## 2017-06-21 NOTE — Progress Notes (Signed)
*  PRELIMINARY RESULTS* Echocardiogram 2D Echocardiogram with definity has been performed.  [name] 06/21/2017, 11:42 AM

## 2017-06-26 ENCOUNTER — Ambulatory Visit (INDEPENDENT_AMBULATORY_CARE_PROVIDER_SITE_OTHER): Payer: Medicare Other | Admitting: Urology

## 2017-06-26 DIAGNOSIS — N3281 Overactive bladder: Secondary | ICD-10-CM | POA: Diagnosis not present

## 2017-06-26 DIAGNOSIS — R351 Nocturia: Secondary | ICD-10-CM

## 2017-06-26 DIAGNOSIS — N401 Enlarged prostate with lower urinary tract symptoms: Secondary | ICD-10-CM

## 2017-06-27 ENCOUNTER — Ambulatory Visit: Payer: Medicare Other | Admitting: Physician Assistant

## 2017-07-03 ENCOUNTER — Encounter: Payer: Self-pay | Admitting: *Deleted

## 2017-07-04 ENCOUNTER — Encounter: Payer: Self-pay | Admitting: Physician Assistant

## 2017-07-04 ENCOUNTER — Ambulatory Visit (INDEPENDENT_AMBULATORY_CARE_PROVIDER_SITE_OTHER): Payer: Medicare Other | Admitting: Physician Assistant

## 2017-07-04 ENCOUNTER — Ambulatory Visit: Payer: Medicare Other | Admitting: Physician Assistant

## 2017-07-04 VITALS — BP 124/66 | HR 70 | Ht 68.0 in | Wt 254.0 lb

## 2017-07-04 DIAGNOSIS — R079 Chest pain, unspecified: Secondary | ICD-10-CM | POA: Diagnosis not present

## 2017-07-04 DIAGNOSIS — Z950 Presence of cardiac pacemaker: Secondary | ICD-10-CM | POA: Diagnosis not present

## 2017-07-04 DIAGNOSIS — I10 Essential (primary) hypertension: Secondary | ICD-10-CM

## 2017-07-04 DIAGNOSIS — I480 Paroxysmal atrial fibrillation: Secondary | ICD-10-CM

## 2017-07-04 DIAGNOSIS — I5032 Chronic diastolic (congestive) heart failure: Secondary | ICD-10-CM | POA: Diagnosis not present

## 2017-07-04 NOTE — Patient Instructions (Signed)
Medication Instructions:  Your physician recommends that you continue on your current medications as directed. Please refer to the Current Medication list given to you today. May Take an Extra Metoprolol if needed   Labwork: NONE   Testing/Procedures: NONE  Follow-Up: Your physician recommends that you schedule a follow-up appointment with Dr. Lovena Le   Any Other Special Instructions Will Be Listed Below (If Applicable).     If you need a refill on your cardiac medications before your next appointment, please call your pharmacy.  Thank you for choosing [facility]!

## 2017-07-04 NOTE — Progress Notes (Signed)
Cardiology Office Note    Date:  07/04/2017   ID:  [name]., DOB [birth year], MRN [id number]  PCP:  [name], MD  Cardiologist: Dr. Lovena Le  No chief complaint on file.   History of Present Illness:  [name]. is a 76 y.o. male with history of symptomatic bradycardia, PAF and persistent atrial flutter status post pacemaker and catheter ablation of atrial flutter. Has maintained normal sinus rhythm most of the time on high-dose flecainide but has atrial fibrillation about 30% of the time. Also has chronic diastolic CHF, hypertension. Saw Dr. Lovena Le 01/15/17 at which time he was doing well and device was working normally.   I saw the patient 06/06/17 because of chest pain related to meals and foods he eats. Also had dyspnea on exertion and fatigue. Stress test 06/13/17 was low risk study with prior inferior/inferoapical MI and mild peri-infarct ischemia. Ejection fraction 55-65%. TEE echo 06/21/17 showed normal LV function ejection fraction 50-55%, mild MR and severely dilated left atrium. Hypokinesis of the apical septal and apical myocardium.  Patient comes in today for f/u. Complains of dizziness with change of position. Has had a long time but worse the past 3 days. Went to [facility] yesterday and BP 081 systolic and they wanted him to cut back on his Diltiazem if ok with [place]. He has had no further chest pain. Doesn't feel heart racing or skipping.     Past Medical History:  Diagnosis Date  . Atrial flutter ([place])    a. s/p ablation 01/2015.  Marland Kitchen BPH (benign prostatic hyperplasia)   . Cerebrovascular disease    carotid bruit; no focal stenosis by carotid ultrasound in 05/2008  . Congestive heart failure (CHF) ([place])   . Dysrhythmia   . General weakness    +malaise, exercise intolerance, and near syncope  . Hypertension   . Low back pain    history of traumax2  . Paroxysmal atrial fibrillation (HCC)   . Presence of permanent cardiac pacemaker 2009  . Sleep apnea    c  pap at night  . Tachy-brady syndrome ([place])    a. s/p St. Jude pacemaker 2009.    Past Surgical History:  Procedure Laterality Date  . ABLATION    . CATARACT EXTRACTION     Bilateral  . CERVICAL DISCECTOMY  2006  . COLONOSCOPY WITH PROPOFOL N/A 01/26/2017   Procedure: COLONOSCOPY WITH PROPOFOL;  Surgeon: [name], MD;  [facility];  Service: Endoscopy;  Laterality: N/A;  8:30  . ELECTROPHYSIOLOGIC STUDY N/A 01/18/2015   Procedure: A-Flutter/A-Tach/Svt Ablation;  Surgeon: [name], MD;  Location: [facility] INVASIVE CV LAB CUPID;  Service: Cardiovascular;  Laterality: N/A;  . KNEE ARTHROSCOPY WITH MEDIAL MENISECTOMY Left 06/03/2015   Procedure: KNEE ARTHROSCOPY WITH MEDIAL MENISECTOMY;  Surgeon: [name], MD;  Location: [facility];  Service: Orthopedics;  Laterality: Left;  . PACEMAKER INSERTION  1966  . POLYPECTOMY  01/26/2017   Procedure: POLYPECTOMY;  Surgeon: [name], MD;  [facility];  Service: Endoscopy;;  colon    Current Medications: Current Meds  Medication Sig  . acetaminophen (TYLENOL) 500 MG tablet Take 500 mg by mouth every 6 (six) hours as needed for mild pain.  Marland Kitchen apixaban (ELIQUIS) 5 MG TABS tablet Take 1 tablet (5 mg total) by mouth 2 (two) times daily.  Marland Kitchen CALCIUM-MAGNESIUM-VITAMIN D PO Take by mouth.  . diltiazem (TIAZAC) 120 MG 24 hr capsule Take 120 mg by mouth every  evening.   . diltiazem (TIAZAC) 300 MG 24 hr capsule Take 300 mg by mouth daily.   . flecainide (TAMBOCOR) 100 MG tablet Take 1.5 tablets (150 mg total) by mouth 2 (two) times daily.  . furosemide (LASIX) 40 MG tablet Take 40 mg by mouth daily as needed (Take Daily if Wt over 245 lbs).   . gabapentin (NEURONTIN) 300 MG capsule Take 300-600 mg by mouth 2 (two) times daily. 300 mg in the morning and 600 mg in the evening  . hydrocortisone (ANUSOL-HC) 2.5 % rectal cream Place 1 application rectally as needed for hemorrhoids or itching.   . lidocaine (LIDODERM) 5 % Place 1  patch onto the skin daily. Remove & Discard patch within 12 hours or as directed by MD  . loratadine (CLARITIN) 10 MG tablet Take 10 mg by mouth daily.  . metoprolol tartrate (LOPRESSOR) 25 MG tablet Take 0.5 tablets (12.5 mg total) by mouth 2 (two) times daily.  Marland Kitchen morphine (MS CONTIN) 15 MG 12 hr tablet Take 15 mg by mouth every 12 (twelve) hours.  . pantoprazole (PROTONIX) 40 MG tablet Take 40 mg by mouth daily.  . polyethylene glycol (MIRALAX / GLYCOLAX) packet Take 17 g by mouth daily.  . psyllium (METAMUCIL) 58.6 % powder Take 1 packet by mouth daily.   . Tamsulosin HCl (FLOMAX) 0.4 MG CAPS Take 0.4 mg by mouth daily.   . [DISCONTINUED] Calcium-Magnesium-Zinc (CAL-MAG-ZINC PO) Take 1 tablet by mouth daily. Calcium 1000 mg Magnesium  400 mg Zinc  25 mg     Allergies:   Bee venom; Shellfish allergy; and Penicillins   Social History   Social History  . Marital status: Divorced    Spouse name: N/A  . Number of children: 3  . Years of education: 12+   Occupational History  . Retired    Social History Main Topics  . Smoking status: Former Smoker    Packs/day: 1.00    Years: 0.50    Types: Cigarettes    Quit date: 03/27/1962  . Smokeless tobacco: Never Used  . Alcohol use No  . Drug use: No  . Sexual activity: Not Asked   Other Topics Concern  . None   Social History Narrative   Divorced with 3 adult children. Semi retired.    Caffeine use: once daily        Family History:  The patient's family history includes Hypertension in his unknown relative.   ROS:   Please see the history of present illness.    Review of Systems  Constitution: Negative.  HENT: Negative.   Eyes: Negative.   Cardiovascular: Negative.   Respiratory: Negative.   Hematologic/Lymphatic: Negative.   Musculoskeletal: Negative.  Negative for joint pain.  Gastrointestinal: Negative.   Genitourinary: Negative.   Neurological: Negative.    All other systems reviewed and are  negative.   PHYSICAL EXAM:   VS:  BP 124/66   Pulse 70   Ht 5\' 8"  (1.727 m)   Wt 254 lb (115.2 kg)   SpO2 95%   BMI 38.62 kg/m   Physical Exam  GEN: Obese, in no acute distress  Neck: no JVD, carotid bruits, or masses Cardiac:RRR; 1/6 systolic murmur at the left sternal border Respiratory:  clear to auscultation bilaterally, normal work of breathing GI: soft, nontender, nondistended, + BS Ext: without cyanosis, clubbing, or edema, Good distal pulses bilaterally Neuro:  Alert and Oriented x 3,  Psych: euthymic mood, full affect  Wt Readings from Last 3  Encounters:  07/04/17 254 lb (115.2 kg)  06/06/17 248 lb (112.5 kg)  03/30/17 244 lb (110.7 kg)      Studies/Labs Reviewed:   EKG:  EKG is ordered today.  The ekg ordered today demonstrates Atrial fibrillation 163 bpm with some paced rhythms reviewed with Dr. Domenic Polite. Another EKG atrial fibrillation at 70 bpm.  Recent Labs: 09/03/2016: Hemoglobin 13.4; Platelets 173 01/15/2017: BUN 20; Creatinine, Ser 1.33; Potassium 3.9; Sodium 136   Lipid Panel    Component Value Date/Time   CHOL 175 04/19/2012 1112   TRIG 157 (H) 04/19/2012 1112   HDL 40 04/19/2012 1112   CHOLHDL 4.4 04/19/2012 1112   VLDL 31 04/19/2012 1112   LDLCALC 104 (H) 04/19/2012 1112    Additional studies/ records that were reviewed today include:  Echo10/4/18 Study Conclusions   - Left ventricle: The cavity size was normal. Wall thickness was   increased in a pattern of moderate LVH. Systolic function was   normal. The estimated ejection fraction was in the range of 50%   to 55%. The study is not technically sufficient to allow   evaluation of LV diastolic function. - Regional wall motion abnormality: Hypokinesis of the apical   septal and apical myocardium. - Aortic valve: Mildly calcified annulus. Trileaflet; mildly   thickened leaflets. Valve area (VTI): 2.25 cm^2. Valve area   (Vmax): 2.22 cm^2. - Mitral valve: There was mild regurgitation. -  Left atrium: The atrium was severely dilated. - Atrial septum: No defect or patent foramen ovale was identified. - Pulmonary arteries: Systolic pressure was mildly increased. PA   peak pressure: 38 mm Hg (S). - Technically difficult study. Echocontrast was used to enhance   visualization.  nuclear stress test 06/13/17 Study Result   Findings consistent with prior inferior/inferoapical myocardial infarction with mild peri-infarct ischemia. (SSS 11, SRS 9, SDS 2)  This is a low risk study.  The left ventricular ejection fraction is normal (55-65%).        ASSESSMENT:    1. Paroxysmal atrial fibrillation (HCC)   2. PPM-St.Jude   3. Essential hypertension   4. Chest pain, unspecified type   5. Chronic diastolic CHF (congestive heart failure) (HCC)      PLAN:  In order of problems listed above:  PAF on Tikosyn, high dose diltiazem and metoprolol . Patient dizzy and in Afib with variable rates up to 160/m laying down but when he stood up HR dropped to 70 bpm. Not orthostatic in the office today. I think his dizziness is coming from his atrial fibrillation with variable rate. He has a pacer check with Dr. Lovena Le next week. Medicines may need to be adjusted. We'll send Dr. Lovena Le copy of this. I've asked patient to take an extra metoprolol if needed and go to the emergency room if necessary.  St. Jude pacemaker for follow-up next week with Dr. Lovena Le  Essential hypertension patient is not orthostatic in the office to take. Blood pressure well controlled.  Chest pain resolved felt to be GI, low risk myoview.  Chronic diastolic CHF compensated recent echo with normal LVEF.    Medication Adjustments/Labs and Tests Ordered: Current medicines are reviewed at length with the patient today.  Concerns regarding medicines are outlined above.  Medication changes, Labs and Tests ordered today are listed in the Patient Instructions below. Patient Instructions  Medication Instructions:   Your physician recommends that you continue on your current medications as directed. Please refer to the Current Medication list given to you today.  May Take an Extra Metoprolol if needed   Labwork: NONE   Testing/Procedures: NONE  Follow-Up: Your physician recommends that you schedule a follow-up appointment with Dr. Lovena Le   Any Other Special Instructions Will Be Listed Below (If Applicable).     If you need a refill on your cardiac medications before your next appointment, please call your pharmacy.  Thank you for choosing [facility]!      Signed, Ermalinda Barrios, PA-C  07/04/2017 1:45 PM    [facility] [place], [place], [place]  [address] Phone: [phone]; Fax: [phone]

## 2017-07-05 DIAGNOSIS — I10 Essential (primary) hypertension: Secondary | ICD-10-CM | POA: Diagnosis not present

## 2017-07-05 DIAGNOSIS — I5032 Chronic diastolic (congestive) heart failure: Secondary | ICD-10-CM | POA: Diagnosis not present

## 2017-07-05 DIAGNOSIS — I480 Paroxysmal atrial fibrillation: Secondary | ICD-10-CM | POA: Diagnosis not present

## 2017-07-05 DIAGNOSIS — Z79899 Other long term (current) drug therapy: Secondary | ICD-10-CM | POA: Diagnosis not present

## 2017-07-06 ENCOUNTER — Encounter (HOSPITAL_COMMUNITY): Payer: Self-pay | Admitting: Emergency Medicine

## 2017-07-06 ENCOUNTER — Emergency Department (HOSPITAL_COMMUNITY): Payer: Medicare Other

## 2017-07-06 ENCOUNTER — Emergency Department (HOSPITAL_COMMUNITY)
Admission: EM | Admit: 2017-07-06 | Discharge: 2017-07-06 | Disposition: A | Discharge status: Home / Self Care | Payer: Medicare Other | Attending: Emergency Medicine | Admitting: Emergency Medicine

## 2017-07-06 DIAGNOSIS — Z5321 Procedure and treatment not carried out due to patient leaving prior to being seen by health care provider: Secondary | ICD-10-CM | POA: Diagnosis not present

## 2017-07-06 DIAGNOSIS — R918 Other nonspecific abnormal finding of lung field: Secondary | ICD-10-CM | POA: Diagnosis not present

## 2017-07-06 NOTE — ED Triage Notes (Addendum)
Patient states he was at the [facility] and they did a chest x-ray on Tuesday and was called today and said he had fluid around his lungs and suggested he come to ER. Denies chest pain or shortness of breath. Patient states he was placed on lasix 3 weeks ago.

## 2017-07-12 ENCOUNTER — Ambulatory Visit (INDEPENDENT_AMBULATORY_CARE_PROVIDER_SITE_OTHER): Payer: Medicare Other | Admitting: *Deleted

## 2017-07-12 DIAGNOSIS — I495 Sick sinus syndrome: Secondary | ICD-10-CM

## 2017-07-12 DIAGNOSIS — I480 Paroxysmal atrial fibrillation: Secondary | ICD-10-CM | POA: Diagnosis not present

## 2017-07-12 DIAGNOSIS — I5032 Chronic diastolic (congestive) heart failure: Secondary | ICD-10-CM | POA: Diagnosis not present

## 2017-07-12 DIAGNOSIS — B356 Tinea cruris: Secondary | ICD-10-CM | POA: Diagnosis not present

## 2017-07-24 ENCOUNTER — Ambulatory Visit
Payer: Medicare Other | Attending: Student in an Organized Health Care Education/Training Program | Admitting: Student in an Organized Health Care Education/Training Program

## 2017-07-24 ENCOUNTER — Encounter: Payer: Self-pay | Admitting: Student in an Organized Health Care Education/Training Program

## 2017-07-24 VITALS — BP 122/69 | HR 75 | Temp 98.0°F | Resp 17 | Ht 68.0 in | Wt 239.0 lb

## 2017-07-24 DIAGNOSIS — G8929 Other chronic pain: Secondary | ICD-10-CM | POA: Insufficient documentation

## 2017-07-24 DIAGNOSIS — M5136 Other intervertebral disc degeneration, lumbar region: Secondary | ICD-10-CM | POA: Insufficient documentation

## 2017-07-24 DIAGNOSIS — I480 Paroxysmal atrial fibrillation: Secondary | ICD-10-CM | POA: Insufficient documentation

## 2017-07-24 DIAGNOSIS — G4733 Obstructive sleep apnea (adult) (pediatric): Secondary | ICD-10-CM | POA: Insufficient documentation

## 2017-07-24 DIAGNOSIS — N400 Enlarged prostate without lower urinary tract symptoms: Secondary | ICD-10-CM | POA: Diagnosis not present

## 2017-07-24 DIAGNOSIS — I110 Hypertensive heart disease with heart failure: Secondary | ICD-10-CM | POA: Diagnosis not present

## 2017-07-24 DIAGNOSIS — Z8673 Personal history of transient ischemic attack (TIA), and cerebral infarction without residual deficits: Secondary | ICD-10-CM | POA: Diagnosis not present

## 2017-07-24 DIAGNOSIS — M545 Low back pain: Secondary | ICD-10-CM | POA: Diagnosis not present

## 2017-07-24 DIAGNOSIS — Z7901 Long term (current) use of anticoagulants: Secondary | ICD-10-CM | POA: Insufficient documentation

## 2017-07-24 DIAGNOSIS — I5032 Chronic diastolic (congestive) heart failure: Secondary | ICD-10-CM | POA: Diagnosis not present

## 2017-07-24 DIAGNOSIS — M5116 Intervertebral disc disorders with radiculopathy, lumbar region: Secondary | ICD-10-CM | POA: Diagnosis not present

## 2017-07-24 DIAGNOSIS — M4306 Spondylolysis, lumbar region: Secondary | ICD-10-CM | POA: Insufficient documentation

## 2017-07-24 DIAGNOSIS — E785 Hyperlipidemia, unspecified: Secondary | ICD-10-CM | POA: Diagnosis not present

## 2017-07-24 DIAGNOSIS — M5416 Radiculopathy, lumbar region: Secondary | ICD-10-CM | POA: Diagnosis not present

## 2017-07-24 NOTE — Patient Instructions (Addendum)
1. Follow up for lumbar epidural steroid injection, must get cardiac clearance to be off Apixaban for 3 days prior to scheduled procedure.  Preparing for your procedure (without sedation) Instructions: . Oral Intake: Do not eat or drink anything for at least 3 hours prior to your procedure. . Transportation: Unless otherwise stated by your physician, you may drive yourself after the procedure. . Blood Pressure Medicine: Take your blood pressure medicine with a sip of water the morning of the procedure. . Insulin: Take only  of your normal insulin dose. . Preventing infections: Shower with an antibacterial soap the morning of your procedure. . Build-up your immune system: Take 1000 mg of Vitamin C with every meal (3 times a day) the day prior to your procedure. . Pregnancy: If you are pregnant, call and cancel the procedure. . Sickness: If you have a cold, fever, or any active infections, call and cancel the procedure. . Arrival: You must be in the facility at least 30 minutes prior to your scheduled procedure. . Children: Do not bring any children with you. . Dress appropriately: Bring dark clothing that you would not mind if they get stained. . Valuables: Do not bring any jewelry or valuables. Procedure appointments are reserved for interventional treatments only. Marland Kitchen No Prescription Refills. . No medication changes will be discussed during procedure appointments. No disability issues will be discussed. Epidural Steroid Injection An epidural steroid injection is given to relieve pain in your neck, back, or legs that is caused by the irritation or swelling of a nerve root. This procedure involves injecting a steroid and numbing medicine (anesthetic) into the epidural space. The epidural space is the space between the outer covering of your spinal cord and the bones that form your backbone (vertebra).  LET [facility] CARE PROVIDER KNOW ABOUT:  Any allergies you have. All medicines you are  taking, including vitamins, herbs, eye drops, creams, and over-the-counter medicines such as aspirin. Previous problems you or members of your family have had with the use of anesthetics. Any blood disorders or blood clotting disorders you have. Previous surgeries you have had. Medical conditions you have.  RISKS AND COMPLICATIONS Generally, this is a safe procedure. However, as with any procedure, complications can occur. Possible complications of epidural steroid injection include: Headache. Bleeding. Infection. Allergic reaction to the medicines. Damage to your nerves. The response to this procedure depends on the underlying cause of the pain and its duration. People who have long-term (chronic) pain are less likely to benefit from epidural steroids than are those people whose pain comes on strong and suddenly.  BEFORE THE PROCEDURE  Ask your health care provider about changing or stopping your regular medicines. You may be advised to stop taking blood-thinning medicines a few days before the procedure. You may be given medicines to reduce anxiety. Arrange for someone to take you home after the procedure.  PROCEDURE  You will remain awake during the procedure. You may receive medicine to make you relaxed. You will be asked to lie on your stomach. The injection site will be cleaned. The injection site will be numbed with a medicine (local anesthetic). A needle will be injected through your skin into the epidural space. Your health care provider will use an X-ray machine to ensure that the steroid is delivered closest to the affected nerve. You may have minimal discomfort at this time. Once the needle is in the right position, the local anesthetic and the steroid will be injected into the epidural space.  The needle will then be removed and a bandage will be applied to the injection site.  AFTER THE PROCEDURE  You may be monitored for a short time before you go home. You may feel  weakness or numbness in your arm or leg, which disappears within hours. You may be allowed to eat, drink, and take your regular medicine. You may have soreness at the site of the injection.   This information is not intended to replace advice given to you by your health care provider. Make sure you discuss any questions you have with your health care provider.   Document Released: 12/12/2007 Document Revised: 05/07/2013 Document Reviewed: 02/21/2013 Elsevier Interactive Patient Education 2016 [place]  What are the risk, side effects and possible complications? Generally speaking, most procedures are safe.  However, with any procedure there are risks, side effects, and the possibility of complications.  The risks and complications are dependent upon the sites that are lesioned, or the type of nerve block to be performed.  The closer the procedure is to the spine, the more serious the risks are.  Great care is taken when placing the radio frequency needles, block needles or lesioning probes, but sometimes complications can occur. Infection: Any time there is an injection through the skin, there is a risk of infection.  This is why sterile conditions are used for these blocks. There are four possible types of infection: 1. Localized skin infection. 2. Central Nervous System Infection: This can be in the form of Meningitis, which can be deadly. 3. Epidural Infections: This can be in the form of an epidural abscess, which can cause pressure inside of the spine, causing compression of the spinal cord with subsequent paralysis. This would require an emergency surgery to decompress, and there are no guarantees that the patient would recover from the paralysis. 4. Discitis: This is an infection of the intervertebral discs. It occurs in about 1% of discography procedures. It is difficult to treat and it may lead to surgery. Pain: the needles have to go through skin and  soft tissues, will cause soreness. Damage to internal structures:  The nerves to be lesioned may be near blood vessels or other nerves which can be potentially damaged. Bleeding: Bleeding is more common if the patient is taking blood thinners such as  aspirin, Coumadin, Ticiid, Plavix, etc., or if he/she have some genetic predisposition such as hemophilia. Bleeding into the spinal canal can cause compression of the spinal  cord with subsequent paralysis.  This would require an emergency surgery to decompress and there are no guarantees that the patient would recover from the paralysis. Pneumothorax: Puncturing of a lung is a possibility, every time a needle is introduced in the area of the chest or upper back.  Pneumothorax refers to free air around the collapsed lung(s), inside of the thoracic cavity (chest cavity).  Another two possible complications related to a similar event would include: Hemothorax and Chylothorax. These are variations of the Pneumothorax, where instead of air around the collapsed lung(s), you may have blood or chyle, respectively. Spinal headaches: They may occur with any procedures in the area of the spine. Persistent CSF (Cerebro-Spinal Fluid) leakage: This is a rare problem, but may occur with prolonged intrathecal or epidural catheters either due to the formation of a fistulous track or a dural tear. Nerve damage: By working so close to the spinal cord, there is always a possibility of nerve damage, which could be as serious  as a permanent spinal cord injury with paralysis. Death: Although rare, severe deadly allergic reactions known as "Anaphylactic reaction" can occur to any of the medications used. Worsening of the symptoms: We can always make thing worse.  What are the chances of something like this happening? Chances of any of this occuring are extremely low.  By statistics, you have more of a chance of getting killed in a motor vehicle accident: while driving to the  hospital than any of the above occurring .  Nevertheless, you should be aware that they are possibilities.  In general, it is similar to taking a shower.  Everybody knows that you can slip, hit your head and get killed.  Does that mean that you should not shower again?  Nevertheless always keep in mind that statistics do not mean anything if you happen to be on the wrong side of them.  Even if a procedure has a 1 (one) in a 1,000,000 (million) chance of going wrong, it you happen to be that one..Also, keep in mind that by statistics, you have more of a chance of having something go wrong when taking medications.  Who should not have this procedure? If you are on a blood thinning medication (e.g. Coumadin, Plavix, see list of "Blood Thinners"), or if you have an active infection going on, you should not have the procedure.  If you are taking any blood thinners, please inform your physician.  Preparing for your procedure: Do not eat or drink anything at least eight (8) hours prior to the procedure. Bring a driver with you .  It cannot be a taxi. Come accompanied by an adult that can drive you back, and that is strong enough to help you if your legs get weak or numb from the local anesthetic. Take all of your medicines the morning of the procedure with just enough water to swallow them. If you have diabetes, make sure that you are scheduled to have your procedure done first thing in the morning, whenever possible. If you have diabetes, take only half of your insulin dose and notify our nurse that you have done so as soon as you arrive at the clinic. If you are diabetic, but only take blood sugar pills (oral hypoglycemic), then do not take them on the morning of your procedure.  You may take them after you have had the procedure. Do not take aspirin or any aspirin-containing medications, at least eleven (11) days prior to the procedure.  They may prolong bleeding. Wear loose fitting clothing that may be  easy to take off and that you would not mind if it got stained with Betadine or blood. Do not wear any jewelry or perfume Remove any nail coloring.  It will interfere with some of our monitoring equipment. If you take Metformin for your diabetes, stop it 48 hours prior to the procedure.  NOTE: Remember that this is not meant to be interpreted as a complete list of all possible complications.  Unforeseen problems may occur.  BLOOD THINNERS The following drugs contain aspirin or other products, which can cause increased bleeding during surgery and should not be taken for 2 weeks prior to and 1 week after surgery.  If you should need take something for relief of minor pain, you may take acetaminophen which is found in Tylenol,m Datril, Anacin-3 and Panadol. It is not blood thinner. The products listed below are.  Do not take any of the products listed below in addition to any listed on  your instruction sheet.  A.P.C or A.P.C with Codeine Codeine Phosphate Capsules #3 Ibuprofen Ridaura  ABC compound Congesprin Imuran rimadil  Advil Cope Indocin Robaxisal  Alka-Seltzer Effervescent Pain Reliever and Antacid Coricidin or Coricidin-D  Indomethacin Rufen  Alka-Seltzer plus Cold Medicine Cosprin Ketoprofen S-A-C Tablets  Anacin Analgesic Tablets or Capsules Coumadin Korlgesic Salflex  Anacin Extra Strength Analgesic tablets or capsules CP-2 Tablets Lanoril Salicylate  Anaprox Cuprimine Capsules Levenox Salocol  Anexsia-D Dalteparin Magan Salsalate  Anodynos Darvon compound Magnesium Salicylate Sine-off  Ansaid Dasin Capsules Magsal Sodium Salicylate  Anturane Depen Capsules Marnal Soma  APF Arthritis pain formula Dewitt's Pills Measurin Stanback  Argesic Dia-Gesic Meclofenamic Sulfinpyrazone  Arthritis Bayer Timed Release Aspirin Diclofenac Meclomen Sulindac  Arthritis pain formula Anacin Dicumarol Medipren Supac  Analgesic (Safety coated) Arthralgen Diffunasal Mefanamic Suprofen  Arthritis  Strength Bufferin Dihydrocodeine Mepro Compound Suprol  Arthropan liquid Dopirydamole Methcarbomol with Aspirin Synalgos  ASA tablets/Enseals Disalcid Micrainin Tagament  Ascriptin Doan's Midol Talwin  Ascriptin A/D Dolene Mobidin Tanderil  Ascriptin Extra Strength Dolobid Moblgesic Ticlid  Ascriptin with Codeine Doloprin or Doloprin with Codeine Momentum Tolectin  Asperbuf Duoprin Mono-gesic Trendar  Aspergum Duradyne Motrin or Motrin IB Triminicin  Aspirin plain, buffered or enteric coated Durasal Myochrisine Trigesic  Aspirin Suppositories Easprin Nalfon Trillsate  Aspirin with Codeine Ecotrin Regular or Extra Strength Naprosyn Uracel  Atromid-S Efficin Naproxen Ursinus  Auranofin Capsules Elmiron Neocylate Vanquish  Axotal Emagrin Norgesic Verin  Azathioprine Empirin or Empirin with Codeine Normiflo Vitamin E  Azolid Emprazil Nuprin Voltaren  Bayer Aspirin plain, buffered or children's or timed BC Tablets or powders Encaprin Orgaran Warfarin Sodium  Buff-a-Comp Enoxaparin Orudis Zorpin  Buff-a-Comp with Codeine Equegesic Os-Cal-Gesic   Buffaprin Excedrin plain, buffered or Extra Strength Oxalid   Bufferin Arthritis Strength Feldene Oxphenbutazone   Bufferin plain or Extra Strength Feldene Capsules Oxycodone with Aspirin   Bufferin with Codeine Fenoprofen Fenoprofen Pabalate or Pabalate-SF   Buffets II Flogesic Panagesic   Buffinol plain or Extra Strength Florinal or Florinal with Codeine Panwarfarin   Buf-Tabs Flurbiprofen Penicillamine   Butalbital Compound Four-way cold tablets Penicillin   Butazolidin Fragmin Pepto-Bismol   Carbenicillin Geminisyn Percodan   Carna Arthritis Reliever Geopen Persantine   Carprofen Gold's salt Persistin   Chloramphenicol Goody's Phenylbutazone   Chloromycetin Haltrain Piroxlcam   Clmetidine heparin Plaquenil   Cllnoril Hyco-pap Ponstel   Clofibrate Hydroxy chloroquine Propoxyphen         . Before stopping any of these medications, be sure  to consult the physician who ordered them.  Some, such as Coumadin (Warfarin) are ordered to prevent or treat serious conditions such as "deep thrombosis", "pumonary embolisms", and other heart problems.  The amount of time that you may need off of the medication may also vary with the medication and the reason for which you were taking it.  If you are taking any of these medications, please make sure you notify your pain physician before you undergo any procedures.

## 2017-07-24 NOTE — Progress Notes (Signed)
Safety precautions to be maintained throughout the outpatient stay will include: orient to surroundings, keep bed in low position, maintain call bell within reach at all times, provide assistance with transfer out of bed and ambulation.  

## 2017-07-24 NOTE — Progress Notes (Signed)
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 does not drink alcohol. Tobacco:  reports that he quit smoking about 55 years ago. His smoking use included cigarettes. He has a 0.50 pack-year smoking history. he has never used smokeless tobacco. Medical:  has a past medical history of Atrial flutter ([place]), BPH (benign prostatic hyperplasia), Cerebrovascular disease, Congestive heart failure (CHF) ([place]), Dysrhythmia, General weakness, Hypertension, Low back pain, Paroxysmal atrial fibrillation ([place]), Presence of permanent cardiac pacemaker (2009), Sleep apnea, and Tachy-brady syndrome ([place]). Family: family history includes Hypertension in his unknown relative.  Past Surgical History:  Procedure Laterality Date  . ABLATION    . CATARACT EXTRACTION     Bilateral  . CERVICAL DISCECTOMY  2006  . PACEMAKER INSERTION  1966   Active Ambulatory Problems    Diagnosis Date Noted  . HYPERLIPIDEMIA  02/17/2010  . LOW BACK PAIN, CHRONIC 07/31/2008  . ORTHOSTATIC DIZZINESS 02/17/2010  . Cerebrovascular disease 07/31/2008  . Chest pain 02/17/2010  . BENIGN PROSTATIC HYPERTROPHY, MILD, HX OF 07/31/2008  . PPM-St.Jude 06/16/2009  . Sick sinus syndrome ([place]) 06/13/2011  . Hypertension 11/21/2011  . Atrial flutter ([place]) 11/28/2012  . OSA (obstructive sleep apnea) 09/02/2013  . Dyspnea 12/25/2014  . Atrial fibrillation ([place]) 03/15/2015  . Fatigue 11/18/2015  . Chronic anticoagulation 11/18/2015  . Paroxysmal atrial fibrillation (H[facility])   . Hx of colonic polyps 12/28/2016  . Chronic diastolic CHF (congestive heart failure) ([place]) 06/06/2017  . Lumbar radiculopathy 07/24/2017  . Pars defect of lumbar spine 07/24/2017  . Lumbar degenerative disc disease 07/24/2017   Resolved Ambulatory Problems    Diagnosis Date Noted  . No Resolved Ambulatory Problems   Past Medical History:  Diagnosis Date  . Atrial flutter ([place])   . BPH (benign prostatic hyperplasia)   . Cerebrovascular disease   . Congestive heart failure (CHF) ([place])   . Dysrhythmia   . General weakness   . Hypertension   . Low back pain   . Paroxysmal atrial fibrillation (H[facility])   . Presence of permanent cardiac pacemaker 2009  . Sleep apnea   . Tachy-brady syndrome ([place])    Constitutional Exam  General appearance: Well nourished, well developed, and well hydrated. In no apparent acute distress Vitals:   07/24/17 1313 07/24/17 1316  BP: 122/69   Pulse: 75   Resp: 17   Temp: 98 F (36.7 C)   SpO2: 95%   Weight: 239 lb (108.4 kg) 239 lb (108.4 kg)  Height: 5' 8"  (1.727 m) 5' 8"  (1.727 m)   BMI Assessment: Estimated body mass index is 36.34 kg/m as calculated from the following:   Height as of this encounter: 5' 8"  (1.727 m).   Weight as of this encounter: 239 lb (108.4 kg).  BMI interpretation table: BMI level Category Range association with higher incidence of chronic pain  <18 kg/m2 Underweight   18.5-24.9  kg/m2 Ideal body weight   25-29.9 kg/m2 Overweight Increased incidence by 20%  30-34.9 kg/m2 Obese (Class I) Increased incidence by 68%  35-39.9 kg/m2 Severe obesity (Class II) Increased incidence by 136%  >40 kg/m2 Extreme obesity (Class III) Increased incidence by 254%   BMI Readings from Last 4 Encounters:  07/24/17 36.34 kg/m  07/06/17 37.10 kg/m  07/04/17 38.62 kg/m  06/06/17 37.71 kg/m   Wt Readings from Last 4 Encounters:  07/24/17 239 lb (108.4 kg)  07/06/17 244 lb (110.7 kg)  07/04/17 254 lb (115.2 kg)  06/06/17 248 lb (112.5 kg)  Psych/Mental status: Alert, oriented x 3 (person, place, & time)       Eyes: PERLA Respiratory: No evidence of acute respiratory distress  Cervical Spine Area Exam  Skin & Axial Inspection: No masses, redness, edema, swelling, or associated skin lesions Alignment: Symmetrical Functional ROM: Unrestricted ROM      Stability: No instability detected Muscle Tone/Strength: Functionally intact. No obvious neuro-muscular anomalies detected. Sensory (Neurological): Unimpaired Palpation: No palpable anomalies              Upper Extremity (UE) Exam    Side: Right upper extremity  Side: Left upper extremity  Skin & Extremity Inspection: Skin color, temperature, and hair growth are WNL. No peripheral edema or cyanosis. No masses, redness, swelling, asymmetry, or associated skin lesions. No contractures.  Skin & Extremity Inspection: Skin color, temperature, and hair growth are WNL. No peripheral edema  or cyanosis. No masses, redness, swelling, asymmetry, or associated skin lesions. No contractures.  Functional ROM: Unrestricted ROM          Functional ROM: Unrestricted ROM          Muscle Tone/Strength: Functionally intact. No obvious neuro-muscular anomalies detected.  Muscle Tone/Strength: Functionally intact. No obvious neuro-muscular anomalies detected.  Sensory (Neurological): Unimpaired          Sensory (Neurological): Unimpaired           Palpation: No palpable anomalies              Palpation: No palpable anomalies              Specialized Test(s): Deferred         Specialized Test(s): Deferred          Thoracic Spine Area Exam  Skin & Axial Inspection: No masses, redness, or swelling Alignment: Symmetrical Functional ROM: Unrestricted ROM Stability: No instability detected Muscle Tone/Strength: Functionally intact. No obvious neuro-muscular anomalies detected. Sensory (Neurological): Unimpaired Muscle strength & Tone: No palpable anomalies  Lumbar Spine Area Exam  Skin & Axial Inspection: No masses, redness, or swelling Alignment: Symmetrical Functional ROM: Decreased ROM, bilaterally Stability: No instability detected Muscle Tone/Strength: Functionally intact. No obvious neuro-muscular anomalies detected. Sensory (Neurological): Unimpaired Palpation: Complains of area being tender to palpation Bilateral Fist Percussion Test Provocative Tests: Lumbar Hyperextension and rotation test: Positive bilaterally for facet joint pain. Lumbar Lateral bending test: Positive ipsilateral radicular pain, bilaterally. Positive for bilateral foraminal stenosis. Patrick's Maneuver: evaluation deferred today                    Gait & Posture Assessment  Ambulation: Unassisted Gait: Relatively normal for age and body habitus Posture: WNL   Lower Extremity Exam    Side: Right lower extremity  Side: Left lower extremity  Skin & Extremity Inspection: Skin color, temperature, and hair growth are WNL. No peripheral edema or cyanosis. No masses, redness, swelling, asymmetry, or associated skin lesions. No contractures.  Skin & Extremity Inspection: Skin color, temperature, and hair growth are WNL. No peripheral edema or cyanosis. No masses, redness, swelling, asymmetry, or associated skin lesions. No contractures.  Functional ROM: Unrestricted ROM          Functional ROM: Unrestricted ROM          Muscle Tone/Strength: Functionally  intact. No obvious neuro-muscular anomalies detected.  Muscle Tone/Strength: Functionally intact. No obvious neuro-muscular anomalies detected.  Sensory (Neurological): Unimpaired  Sensory (Neurological): Unimpaired  Palpation: No palpable anomalies  Palpation: No palpable anomalies   Assessment  Primary Diagnosis & Pertinent Problem List: The primary encounter diagnosis was Lumbar radiculopathy. Diagnoses of Pars defect of lumbar spine and Lumbar degenerative disc disease were also pertinent to this visit.  Visit Diagnosis (New problems to examiner): 1. Lumbar radiculopathy   2. Pars defect of lumbar spine   3. Lumbar degenerative disc disease    General Recommendations: The pain condition that the patient suffers from is best treated with a multidisciplinary approach that involves an increase in physical activity to prevent de-conditioning and worsening of the pain cycle, as well as psychological counseling (formal and/or informal) to address the co-morbid psychological affects of pain. Treatment will often involve judicious use of pain medications and interventional procedures to decrease the pain, allowing the patient to participate in the physical activity that will ultimately produce long-lasting pain reductions. The goal of the multidisciplinary approach is to return the patient to a  higher level of overall function and to restore their ability to perform activities of daily living.  76 year old male who presents with bilateral axial low back pain with radiation into bilateral lower extremities secondary to lumbar radiculopathy most pronounced at L4-L5 and L5-S1 due to neuroforaminal stenosis as well as lumbar degenerative disc disease and L5 pars defect.  Patient sees the [place] for medication management and wishes to continue medication management there.  Today we discussed interventional therapy options including lumbar epidural steroid injection under fluoroscopy.  Risks and benefits were  discussed and patient would like to proceed.  Patient is on apixaban for his atrial fibrillation.  We will obtain cardiac clearance from his cardiologist to ensure that it is safe to stop this medication for 3 days prior to his scheduled lumbar epidural steroid injection.  Plan: -Obtain cardiac clearance for patient to stop apixaban 3 days prior to scheduled procedure -L5-S1 midline interlaminar epidural steroid injection under fluoroscopy. -[facility]  Ordered Lab-work, Procedure(s), Referral(s), & Consult(s): Orders Placed This Encounter  Procedures  . Lumbar Epidural Injection    Provider-requested follow-up: Return for Procedure, Blood Thinner Protocol.  Future Appointments  Date Time Provider [place]  09/03/2017  8:45 AM [name], MD [place]    Primary Care Physician: [name], MD Location: [facility] Note by: Gillis Santa, M.D, Date: 07/24/2017; Time: 3:32 PM  Patient Instructions   1. Follow up for lumbar epidural steroid injection, must get cardiac clearance to be off Apixaban for 3 days prior to scheduled procedure.  Preparing for your procedure (without sedation) Instructions: . Oral Intake: Do not eat or drink anything for at least 3 hours prior to your procedure. . Transportation: Unless otherwise stated by your physician, you may drive yourself after the procedure. . Blood Pressure Medicine: Take your blood pressure medicine with a sip of water the morning of the procedure. . Insulin: Take only  of your normal insulin dose. . Preventing infections: Shower with an antibacterial soap the morning of your procedure. . Build-up your immune system: Take 1000 mg of Vitamin C with every meal (3 times a day) the day prior to your procedure. . Pregnancy: If you are pregnant, call and cancel the procedure. . Sickness: If you have a cold, fever, or any active  infections, call and cancel the procedure. . Arrival: You must be in the facility at least 30 minutes prior to your scheduled procedure. . Children: Do not bring any children with you. . Dress appropriately: Bring dark clothing that you would not mind if they get stained. . Valuables: Do not bring any jewelry or valuables. Procedure appointments are reserved for interventional treatments only. Marland Kitchen No Prescription Refills. . No medication changes will be discussed during procedure appointments. No disability issues will be discussed. Epidural Steroid Injection An epidural steroid injection is given to relieve pain in your neck, back, or legs that is caused by the irritation or swelling of a nerve root. This procedure involves injecting a steroid and numbing medicine (anesthetic) into the epidural space. The epidural space is the space between the outer covering of your spinal cord and the bones that form your backbone (vertebra).  LET [facility] CARE PROVIDER KNOW ABOUT:  Any allergies you have. All medicines you are taking, including vitamins, herbs, eye drops, creams, and over-the-counter medicines such as aspirin. Previous problems you or members of your family have had with the use of  anesthetics. Any blood disorders or blood clotting disorders you have. Previous surgeries you have had. Medical conditions you have.  RISKS AND COMPLICATIONS Generally, this is a safe procedure. However, as with any procedure, complications can occur. Possible complications of epidural steroid injection include: Headache. Bleeding. Infection. Allergic reaction to the medicines. Damage to your nerves. The response to this procedure depends on the underlying cause of the pain and its duration. People who have long-term (chronic) pain are less likely to benefit from epidural steroids than are those people whose pain comes on strong and suddenly.  BEFORE THE PROCEDURE  Ask your health care provider about  changing or stopping your regular medicines. You may be advised to stop taking blood-thinning medicines a few days before the procedure. You may be given medicines to reduce anxiety. Arrange for someone to take you home after the procedure.  PROCEDURE  You will remain awake during the procedure. You may receive medicine to make you relaxed. You will be asked to lie on your stomach. The injection site will be cleaned. The injection site will be numbed with a medicine (local anesthetic). A needle will be injected through your skin into the epidural space. Your health care provider will use an X-ray machine to ensure that the steroid is delivered closest to the affected nerve. You may have minimal discomfort at this time. Once the needle is in the right position, the local anesthetic and the steroid will be injected into the epidural space. The needle will then be removed and a bandage will be applied to the injection site.  AFTER THE PROCEDURE  You may be monitored for a short time before you go home. You may feel weakness or numbness in your arm or leg, which disappears within hours. You may be allowed to eat, drink, and take your regular medicine. You may have soreness at the site of the injection.   This information is not intended to replace advice given to you by your health care provider. Make sure you discuss any questions you have with your health care provider.   Document Released: 12/12/2007 Document Revised: 05/07/2013 Document Reviewed: 02/21/2013 Elsevier Interactive Patient Education 2016 [place]  What are the risk, side effects and possible complications? Generally speaking, most procedures are safe.  However, with any procedure there are risks, side effects, and the possibility of complications.  The risks and complications are dependent upon the sites that are lesioned, or the type of nerve block to be performed.  The closer the  procedure is to the spine, the more serious the risks are.  Great care is taken when placing the radio frequency needles, block needles or lesioning probes, but sometimes complications can occur. Infection: Any time there is an injection through the skin, there is a risk of infection.  This is why sterile conditions are used for these blocks. There are four possible types of infection: 1. Localized skin infection. 2. Central Nervous System Infection: This can be in the form of Meningitis, which can be deadly. 3. Epidural Infections: This can be in the form of an epidural abscess, which can cause pressure inside of the spine, causing compression of the spinal cord with subsequent paralysis. This would require an emergency surgery to decompress, and there are no guarantees that the patient would recover from the paralysis. 4. Discitis: This is an infection of the intervertebral discs. It occurs in about 1% of discography procedures. It is difficult to treat and it may  lead to surgery. Pain: the needles have to go through skin and soft tissues, will cause soreness. Damage to internal structures:  The nerves to be lesioned may be near blood vessels or other nerves which can be potentially damaged. Bleeding: Bleeding is more common if the patient is taking blood thinners such as  aspirin, Coumadin, Ticiid, Plavix, etc., or if he/she have some genetic predisposition such as hemophilia. Bleeding into the spinal canal can cause compression of the spinal  cord with subsequent paralysis.  This would require an emergency surgery to decompress and there are no guarantees that the patient would recover from the paralysis. Pneumothorax: Puncturing of a lung is a possibility, every time a needle is introduced in the area of the chest or upper back.  Pneumothorax refers to free air around the collapsed lung(s), inside of the thoracic cavity (chest cavity).  Another two possible complications related to a similar event  would include: Hemothorax and Chylothorax. These are variations of the Pneumothorax, where instead of air around the collapsed lung(s), you may have blood or chyle, respectively. Spinal headaches: They may occur with any procedures in the area of the spine. Persistent CSF (Cerebro-Spinal Fluid) leakage: This is a rare problem, but may occur with prolonged intrathecal or epidural catheters either due to the formation of a fistulous track or a dural tear. Nerve damage: By working so close to the spinal cord, there is always a possibility of nerve damage, which could be as serious as a permanent spinal cord injury with paralysis. Death: Although rare, severe deadly allergic reactions known as "Anaphylactic reaction" can occur to any of the medications used. Worsening of the symptoms: We can always make thing worse.  What are the chances of something like this happening? Chances of any of this occuring are extremely low.  By statistics, you have more of a chance of getting killed in a motor vehicle accident: while driving to the hospital than any of the above occurring .  Nevertheless, you should be aware that they are possibilities.  In general, it is similar to taking a shower.  Everybody knows that you can slip, hit your head and get killed.  Does that mean that you should not shower again?  Nevertheless always keep in mind that statistics do not mean anything if you happen to be on the wrong side of them.  Even if a procedure has a 1 (one) in a 1,000,000 (million) chance of going wrong, it you happen to be that one..Also, keep in mind that by statistics, you have more of a chance of having something go wrong when taking medications.  Who should not have this procedure? If you are on a blood thinning medication (e.g. Coumadin, Plavix, see list of "Blood Thinners"), or if you have an active infection going on, you should not have the procedure.  If you are taking any blood thinners, please inform your  physician.  Preparing for your procedure: Do not eat or drink anything at least eight (8) hours prior to the procedure. Bring a driver with you .  It cannot be a taxi. Come accompanied by an adult that can drive you back, and that is strong enough to help you if your legs get weak or numb from the local anesthetic. Take all of your medicines the morning of the procedure with just enough water to swallow them. If you have diabetes, make sure that you are scheduled to have your procedure done first thing in the morning, whenever possible.  If you have diabetes, take only half of your insulin dose and notify our nurse that you have done so as soon as you arrive at the clinic. If you are diabetic, but only take blood sugar pills (oral hypoglycemic), then do not take them on the morning of your procedure.  You may take them after you have had the procedure. Do not take aspirin or any aspirin-containing medications, at least eleven (11) days prior to the procedure.  They may prolong bleeding. Wear loose fitting clothing that may be easy to take off and that you would not mind if it got stained with Betadine or blood. Do not wear any jewelry or perfume Remove any nail coloring.  It will interfere with some of our monitoring equipment. If you take Metformin for your diabetes, stop it 48 hours prior to the procedure.  NOTE: Remember that this is not meant to be interpreted as a complete list of all possible complications.  Unforeseen problems may occur.  BLOOD THINNERS The following drugs contain aspirin or other products, which can cause increased bleeding during surgery and should not be taken for 2 weeks prior to and 1 week after surgery.  If you should need take something for relief of minor pain, you may take acetaminophen which is found in Tylenol,m Datril, Anacin-3 and Panadol. It is not blood thinner. The products listed below are.  Do not take any of the products listed below in addition to any  listed on your instruction sheet.  A.P.C or A.P.C with Codeine Codeine Phosphate Capsules #3 Ibuprofen Ridaura  ABC compound Congesprin Imuran rimadil  Advil Cope Indocin Robaxisal  Alka-Seltzer Effervescent Pain Reliever and Antacid Coricidin or Coricidin-D  Indomethacin Rufen  Alka-Seltzer plus Cold Medicine Cosprin Ketoprofen S-A-C Tablets  Anacin Analgesic Tablets or Capsules Coumadin Korlgesic Salflex  Anacin Extra Strength Analgesic tablets or capsules CP-2 Tablets Lanoril Salicylate  Anaprox Cuprimine Capsules Levenox Salocol  Anexsia-D Dalteparin Magan Salsalate  Anodynos Darvon compound Magnesium Salicylate Sine-off  Ansaid Dasin Capsules Magsal Sodium Salicylate  Anturane Depen Capsules Marnal Soma  APF Arthritis pain formula Dewitt's Pills Measurin Stanback  Argesic Dia-Gesic Meclofenamic Sulfinpyrazone  Arthritis Bayer Timed Release Aspirin Diclofenac Meclomen Sulindac  Arthritis pain formula Anacin Dicumarol Medipren Supac  Analgesic (Safety coated) Arthralgen Diffunasal Mefanamic Suprofen  Arthritis Strength Bufferin Dihydrocodeine Mepro Compound Suprol  Arthropan liquid Dopirydamole Methcarbomol with Aspirin Synalgos  ASA tablets/Enseals Disalcid Micrainin Tagament  Ascriptin Doan's Midol Talwin  Ascriptin A/D Dolene Mobidin Tanderil  Ascriptin Extra Strength Dolobid Moblgesic Ticlid  Ascriptin with Codeine Doloprin or Doloprin with Codeine Momentum Tolectin  Asperbuf Duoprin Mono-gesic Trendar  Aspergum Duradyne Motrin or Motrin IB Triminicin  Aspirin plain, buffered or enteric coated Durasal Myochrisine Trigesic  Aspirin Suppositories Easprin Nalfon Trillsate  Aspirin with Codeine Ecotrin Regular or Extra Strength Naprosyn Uracel  Atromid-S Efficin Naproxen Ursinus  Auranofin Capsules Elmiron Neocylate Vanquish  Axotal Emagrin Norgesic Verin  Azathioprine Empirin or Empirin with Codeine Normiflo Vitamin E  Azolid Emprazil Nuprin Voltaren  Bayer Aspirin plain,  buffered or children's or timed BC Tablets or powders Encaprin Orgaran Warfarin Sodium  Buff-a-Comp Enoxaparin Orudis Zorpin  Buff-a-Comp with Codeine Equegesic Os-Cal-Gesic   Buffaprin Excedrin plain, buffered or Extra Strength Oxalid   Bufferin Arthritis Strength Feldene Oxphenbutazone   Bufferin plain or Extra Strength Feldene Capsules Oxycodone with Aspirin   Bufferin with Codeine Fenoprofen Fenoprofen Pabalate or Pabalate-SF   Buffets II Flogesic Panagesic   Buffinol plain or Extra Strength Florinal or Florinal with  Codeine Panwarfarin   Buf-Tabs Flurbiprofen Penicillamine   Butalbital Compound Four-way cold tablets Penicillin   Butazolidin Fragmin Pepto-Bismol   Carbenicillin Geminisyn Percodan   Carna Arthritis Reliever Geopen Persantine   Carprofen Gold's salt Persistin   Chloramphenicol Goody's Phenylbutazone   Chloromycetin Haltrain Piroxlcam   Clmetidine heparin Plaquenil   Cllnoril Hyco-pap Ponstel   Clofibrate Hydroxy chloroquine Propoxyphen         . Before stopping any of these medications, be sure to consult the physician who ordered them.  Some, such as Coumadin (Warfarin) are ordered to prevent or treat serious conditions such as "deep thrombosis", "pumonary embolisms", and other heart problems.  The amount of time that you may need off of the medication may also vary with the medication and the reason for which you were taking it.  If you are taking any of these medications, please make sure you notify your pain physician before you undergo any procedures.

## 2017-07-27 LAB — CUP PACEART INCLINIC DEVICE CHECK
Battery Impedance: 3400 Ohm
Battery Voltage: 2.71 V
Implantable Lead Implant Date: 20090903
Implantable Lead Location: 753859
Lead Channel Impedance Value: 379 Ohm
Lead Channel Impedance Value: 495 Ohm
Lead Channel Pacing Threshold Pulse Width: 0.5 ms
Lead Channel Sensing Intrinsic Amplitude: 0.8 mV
Lead Channel Setting Pacing Pulse Width: 0.5 ms
MDC IDC LEAD IMPLANT DT: 20090903
MDC IDC LEAD LOCATION: 753860
MDC IDC MSMT LEADCHNL RV PACING THRESHOLD AMPLITUDE: 1.25 V
MDC IDC MSMT LEADCHNL RV SENSING INTR AMPL: 8.1 mV
MDC IDC PG IMPLANT DT: 20090903
MDC IDC SESS DTM: 20181025153613
MDC IDC SET LEADCHNL RA PACING AMPLITUDE: 2.5 V
MDC IDC SET LEADCHNL RV PACING AMPLITUDE: 2.5 V
MDC IDC SET LEADCHNL RV SENSING SENSITIVITY: 2 mV
Pulse Gen Serial Number: 1269181

## 2017-07-27 NOTE — Progress Notes (Signed)
Pacemaker check in clinic. Normal device function. Threshold, sensing, impedances consistent with previous measurements. Device programmed to maximize longevity. 54% AT/AF burden, max dur. 69mins 28sec + Flecainide/Eliquis. Device programmed at appropriate safety margins. Histogram distribution appropriate for patient activity level. Device programmed to optimize intrinsic conduction. Estimated longevity 2-3 years. AT/AF detection rate decreased to 140bpm. Patient will follow up with GT/R in 6 months.

## 2017-08-06 ENCOUNTER — Emergency Department (HOSPITAL_COMMUNITY): Payer: Medicare Other

## 2017-08-06 ENCOUNTER — Encounter (HOSPITAL_COMMUNITY): Payer: Self-pay

## 2017-08-06 ENCOUNTER — Other Ambulatory Visit: Payer: Self-pay

## 2017-08-06 ENCOUNTER — Emergency Department (HOSPITAL_COMMUNITY)
Admission: EM | Admit: 2017-08-06 | Discharge: 2017-08-06 | Disposition: A | Discharge status: Home / Self Care | Payer: Medicare Other | Source: Home / Self Care

## 2017-08-06 DIAGNOSIS — R000 Tachycardia, unspecified: Secondary | ICD-10-CM | POA: Diagnosis not present

## 2017-08-06 DIAGNOSIS — Z950 Presence of cardiac pacemaker: Secondary | ICD-10-CM | POA: Diagnosis not present

## 2017-08-06 DIAGNOSIS — Z5321 Procedure and treatment not carried out due to patient leaving prior to being seen by health care provider: Secondary | ICD-10-CM

## 2017-08-06 DIAGNOSIS — Z87891 Personal history of nicotine dependence: Secondary | ICD-10-CM | POA: Diagnosis not present

## 2017-08-06 DIAGNOSIS — N400 Enlarged prostate without lower urinary tract symptoms: Secondary | ICD-10-CM | POA: Diagnosis not present

## 2017-08-06 DIAGNOSIS — R0600 Dyspnea, unspecified: Secondary | ICD-10-CM | POA: Diagnosis not present

## 2017-08-06 DIAGNOSIS — G4733 Obstructive sleep apnea (adult) (pediatric): Secondary | ICD-10-CM | POA: Diagnosis not present

## 2017-08-06 DIAGNOSIS — R0602 Shortness of breath: Secondary | ICD-10-CM

## 2017-08-06 DIAGNOSIS — H9190 Unspecified hearing loss, unspecified ear: Secondary | ICD-10-CM | POA: Diagnosis not present

## 2017-08-06 DIAGNOSIS — R739 Hyperglycemia, unspecified: Secondary | ICD-10-CM | POA: Diagnosis not present

## 2017-08-06 DIAGNOSIS — Z880 Allergy status to penicillin: Secondary | ICD-10-CM | POA: Diagnosis not present

## 2017-08-06 DIAGNOSIS — E785 Hyperlipidemia, unspecified: Secondary | ICD-10-CM | POA: Diagnosis not present

## 2017-08-06 DIAGNOSIS — Z91030 Bee allergy status: Secondary | ICD-10-CM | POA: Diagnosis not present

## 2017-08-06 DIAGNOSIS — Z91013 Allergy to seafood: Secondary | ICD-10-CM | POA: Diagnosis not present

## 2017-08-06 DIAGNOSIS — N183 Chronic kidney disease, stage 3 (moderate): Secondary | ICD-10-CM | POA: Diagnosis not present

## 2017-08-06 DIAGNOSIS — I130 Hypertensive heart and chronic kidney disease with heart failure and stage 1 through stage 4 chronic kidney disease, or unspecified chronic kidney disease: Secondary | ICD-10-CM | POA: Diagnosis not present

## 2017-08-06 DIAGNOSIS — I480 Paroxysmal atrial fibrillation: Secondary | ICD-10-CM | POA: Diagnosis not present

## 2017-08-06 DIAGNOSIS — Z9841 Cataract extraction status, right eye: Secondary | ICD-10-CM | POA: Diagnosis not present

## 2017-08-06 DIAGNOSIS — I5023 Acute on chronic systolic (congestive) heart failure: Secondary | ICD-10-CM | POA: Diagnosis not present

## 2017-08-06 DIAGNOSIS — I5043 Acute on chronic combined systolic (congestive) and diastolic (congestive) heart failure: Secondary | ICD-10-CM | POA: Diagnosis not present

## 2017-08-06 DIAGNOSIS — R079 Chest pain, unspecified: Secondary | ICD-10-CM | POA: Diagnosis not present

## 2017-08-06 DIAGNOSIS — N179 Acute kidney failure, unspecified: Secondary | ICD-10-CM | POA: Diagnosis not present

## 2017-08-06 DIAGNOSIS — R0902 Hypoxemia: Secondary | ICD-10-CM | POA: Diagnosis not present

## 2017-08-06 DIAGNOSIS — Z9842 Cataract extraction status, left eye: Secondary | ICD-10-CM | POA: Diagnosis not present

## 2017-08-06 DIAGNOSIS — Z7901 Long term (current) use of anticoagulants: Secondary | ICD-10-CM | POA: Diagnosis not present

## 2017-08-06 DIAGNOSIS — G8929 Other chronic pain: Secondary | ICD-10-CM | POA: Diagnosis not present

## 2017-08-06 DIAGNOSIS — I110 Hypertensive heart disease with heart failure: Secondary | ICD-10-CM | POA: Diagnosis not present

## 2017-08-06 DIAGNOSIS — I509 Heart failure, unspecified: Secondary | ICD-10-CM | POA: Diagnosis not present

## 2017-08-06 DIAGNOSIS — Z79899 Other long term (current) drug therapy: Secondary | ICD-10-CM | POA: Diagnosis not present

## 2017-08-06 DIAGNOSIS — I878 Other specified disorders of veins: Secondary | ICD-10-CM | POA: Diagnosis not present

## 2017-08-06 LAB — BASIC METABOLIC PANEL
ANION GAP: 8 (ref 5–15)
BUN: 20 mg/dL (ref 6–20)
CALCIUM: 9.3 mg/dL (ref 8.9–10.3)
CO2: 27 mmol/L (ref 22–32)
Chloride: 99 mmol/L — ABNORMAL LOW (ref 101–111)
Creatinine, Ser: 1.49 mg/dL — ABNORMAL HIGH (ref 0.61–1.24)
GFR, EST AFRICAN AMERICAN: 51 mL/min — AB (ref >60)
GFR, EST NON AFRICAN AMERICAN: 44 mL/min — AB (ref >60)
GLUCOSE: 138 mg/dL — AB (ref 65–99)
Potassium: 4 mmol/L (ref 3.5–5.1)
SODIUM: 134 mmol/L — AB (ref 135–145)

## 2017-08-06 LAB — CBC
HEMATOCRIT: 40.1 % (ref 39.0–52.0)
Hemoglobin: 12.4 g/dL — ABNORMAL LOW (ref 13.0–17.0)
MCH: 30.6 pg (ref 26.0–34.0)
MCHC: 30.9 g/dL (ref 30.0–36.0)
MCV: 99 fL (ref 78.0–100.0)
Platelets: 175 10*3/uL (ref 150–400)
RBC: 4.05 MIL/uL — ABNORMAL LOW (ref 4.22–5.81)
RDW: 14.4 % (ref 11.5–15.5)
WBC: 6.5 10*3/uL (ref 4.0–10.5)

## 2017-08-06 LAB — TROPONIN I

## 2017-08-06 NOTE — ED Triage Notes (Signed)
Reports of feeding dogs and felt weak and short of breath. Reports of shortness increased while walking. Denies pain. NAD noted in triage.

## 2017-08-08 ENCOUNTER — Encounter (HOSPITAL_COMMUNITY): Payer: Self-pay | Admitting: Emergency Medicine

## 2017-08-08 ENCOUNTER — Other Ambulatory Visit: Payer: Self-pay

## 2017-08-08 ENCOUNTER — Emergency Department (HOSPITAL_COMMUNITY): Payer: Medicare Other

## 2017-08-08 ENCOUNTER — Inpatient Hospital Stay (HOSPITAL_COMMUNITY)
Admission: EM | Admit: 2017-08-08 | Discharge: 2017-08-11 | DRG: 291 | Disposition: A | Discharge status: Home / Self Care | Payer: Medicare Other | Attending: Internal Medicine | Admitting: Internal Medicine

## 2017-08-08 DIAGNOSIS — Z91013 Allergy to seafood: Secondary | ICD-10-CM

## 2017-08-08 DIAGNOSIS — N400 Enlarged prostate without lower urinary tract symptoms: Secondary | ICD-10-CM | POA: Diagnosis present

## 2017-08-08 DIAGNOSIS — I5023 Acute on chronic systolic (congestive) heart failure: Secondary | ICD-10-CM

## 2017-08-08 DIAGNOSIS — N183 Chronic kidney disease, stage 3 unspecified: Secondary | ICD-10-CM | POA: Diagnosis present

## 2017-08-08 DIAGNOSIS — Z880 Allergy status to penicillin: Secondary | ICD-10-CM | POA: Diagnosis not present

## 2017-08-08 DIAGNOSIS — Z5321 Procedure and treatment not carried out due to patient leaving prior to being seen by health care provider: Secondary | ICD-10-CM | POA: Diagnosis present

## 2017-08-08 DIAGNOSIS — I5033 Acute on chronic diastolic (congestive) heart failure: Secondary | ICD-10-CM | POA: Diagnosis not present

## 2017-08-08 DIAGNOSIS — I130 Hypertensive heart and chronic kidney disease with heart failure and stage 1 through stage 4 chronic kidney disease, or unspecified chronic kidney disease: Principal | ICD-10-CM | POA: Diagnosis present

## 2017-08-08 DIAGNOSIS — I110 Hypertensive heart disease with heart failure: Secondary | ICD-10-CM | POA: Diagnosis not present

## 2017-08-08 DIAGNOSIS — Z950 Presence of cardiac pacemaker: Secondary | ICD-10-CM | POA: Diagnosis not present

## 2017-08-08 DIAGNOSIS — R739 Hyperglycemia, unspecified: Secondary | ICD-10-CM | POA: Diagnosis present

## 2017-08-08 DIAGNOSIS — N179 Acute kidney failure, unspecified: Secondary | ICD-10-CM | POA: Diagnosis not present

## 2017-08-08 DIAGNOSIS — Z7901 Long term (current) use of anticoagulants: Secondary | ICD-10-CM

## 2017-08-08 DIAGNOSIS — Z9842 Cataract extraction status, left eye: Secondary | ICD-10-CM | POA: Diagnosis not present

## 2017-08-08 DIAGNOSIS — R0902 Hypoxemia: Secondary | ICD-10-CM | POA: Diagnosis present

## 2017-08-08 DIAGNOSIS — N189 Chronic kidney disease, unspecified: Secondary | ICD-10-CM | POA: Diagnosis not present

## 2017-08-08 DIAGNOSIS — E785 Hyperlipidemia, unspecified: Secondary | ICD-10-CM | POA: Diagnosis present

## 2017-08-08 DIAGNOSIS — I10 Essential (primary) hypertension: Secondary | ICD-10-CM | POA: Diagnosis present

## 2017-08-08 DIAGNOSIS — Z87891 Personal history of nicotine dependence: Secondary | ICD-10-CM | POA: Diagnosis not present

## 2017-08-08 DIAGNOSIS — Z91030 Bee allergy status: Secondary | ICD-10-CM | POA: Diagnosis not present

## 2017-08-08 DIAGNOSIS — Z79899 Other long term (current) drug therapy: Secondary | ICD-10-CM

## 2017-08-08 DIAGNOSIS — Z9841 Cataract extraction status, right eye: Secondary | ICD-10-CM | POA: Diagnosis not present

## 2017-08-08 DIAGNOSIS — R079 Chest pain, unspecified: Secondary | ICD-10-CM | POA: Diagnosis not present

## 2017-08-08 DIAGNOSIS — G4733 Obstructive sleep apnea (adult) (pediatric): Secondary | ICD-10-CM | POA: Diagnosis present

## 2017-08-08 DIAGNOSIS — I509 Heart failure, unspecified: Secondary | ICD-10-CM | POA: Diagnosis not present

## 2017-08-08 DIAGNOSIS — I5043 Acute on chronic combined systolic (congestive) and diastolic (congestive) heart failure: Secondary | ICD-10-CM | POA: Diagnosis present

## 2017-08-08 DIAGNOSIS — G8929 Other chronic pain: Secondary | ICD-10-CM | POA: Diagnosis present

## 2017-08-08 DIAGNOSIS — R000 Tachycardia, unspecified: Secondary | ICD-10-CM | POA: Diagnosis not present

## 2017-08-08 DIAGNOSIS — H9190 Unspecified hearing loss, unspecified ear: Secondary | ICD-10-CM | POA: Diagnosis present

## 2017-08-08 DIAGNOSIS — I878 Other specified disorders of veins: Secondary | ICD-10-CM | POA: Diagnosis present

## 2017-08-08 DIAGNOSIS — I480 Paroxysmal atrial fibrillation: Secondary | ICD-10-CM | POA: Diagnosis present

## 2017-08-08 LAB — URINALYSIS, ROUTINE W REFLEX MICROSCOPIC
Bilirubin Urine: NEGATIVE
Glucose, UA: NEGATIVE mg/dL
HGB URINE DIPSTICK: NEGATIVE
Ketones, ur: NEGATIVE mg/dL
LEUKOCYTES UA: NEGATIVE
Nitrite: NEGATIVE
PROTEIN: NEGATIVE mg/dL
SPECIFIC GRAVITY, URINE: 1.019 (ref 1.005–1.030)
pH: 5 (ref 5.0–8.0)

## 2017-08-08 LAB — BASIC METABOLIC PANEL
ANION GAP: 12 (ref 5–15)
BUN: 22 mg/dL — AB (ref 6–20)
CALCIUM: 9.7 mg/dL (ref 8.9–10.3)
CO2: 25 mmol/L (ref 22–32)
Chloride: 97 mmol/L — ABNORMAL LOW (ref 101–111)
Creatinine, Ser: 1.62 mg/dL — ABNORMAL HIGH (ref 0.61–1.24)
GFR calc non Af Amer: 40 mL/min — ABNORMAL LOW (ref >60)
GFR, EST AFRICAN AMERICAN: 46 mL/min — AB (ref >60)
GLUCOSE: 146 mg/dL — AB (ref 65–99)
POTASSIUM: 4 mmol/L (ref 3.5–5.1)
Sodium: 134 mmol/L — ABNORMAL LOW (ref 135–145)

## 2017-08-08 LAB — CBC
HCT: 41 % (ref 39.0–52.0)
HEMOGLOBIN: 13 g/dL (ref 13.0–17.0)
MCH: 31 pg (ref 26.0–34.0)
MCHC: 31.7 g/dL (ref 30.0–36.0)
MCV: 97.9 fL (ref 78.0–100.0)
PLATELETS: 185 10*3/uL (ref 150–400)
RBC: 4.19 MIL/uL — ABNORMAL LOW (ref 4.22–5.81)
RDW: 14.8 % (ref 11.5–15.5)
WBC: 7.9 10*3/uL (ref 4.0–10.5)

## 2017-08-08 LAB — HEMOGLOBIN A1C
HEMOGLOBIN A1C: 6 % — AB (ref 4.8–5.6)
Mean Plasma Glucose: 125.5 mg/dL

## 2017-08-08 LAB — HEPATIC FUNCTION PANEL
ALBUMIN: 4.2 g/dL (ref 3.5–5.0)
ALT: 21 U/L (ref 17–63)
AST: 28 U/L (ref 15–41)
Alkaline Phosphatase: 74 U/L (ref 38–126)
BILIRUBIN TOTAL: 0.7 mg/dL (ref 0.3–1.2)
Bilirubin, Direct: 0.1 mg/dL (ref 0.1–0.5)
Indirect Bilirubin: 0.6 mg/dL (ref 0.3–0.9)
TOTAL PROTEIN: 8.1 g/dL (ref 6.5–8.1)

## 2017-08-08 LAB — TROPONIN I
Troponin I: <0.03 ng/mL (ref <0.03)
Troponin I: <0.03 ng/mL (ref <0.03)

## 2017-08-08 LAB — BRAIN NATRIURETIC PEPTIDE: B Natriuretic Peptide: 870 pg/mL — ABNORMAL HIGH (ref 0.0–100.0)

## 2017-08-08 LAB — MAGNESIUM: MAGNESIUM: 2.4 mg/dL (ref 1.7–2.4)

## 2017-08-08 LAB — TSH: TSH: 5.154 u[IU]/mL — AB (ref 0.350–4.500)

## 2017-08-08 LAB — GLUCOSE, CAPILLARY: Glucose-Capillary: 109 mg/dL — ABNORMAL HIGH (ref 65–99)

## 2017-08-08 MED ORDER — PANTOPRAZOLE SODIUM 40 MG PO TBEC
40.0000 mg | DELAYED_RELEASE_TABLET | Freq: Every day | ORAL | Status: DC
  Administered 2017-08-09 – 2017-08-11 (×3): 40 mg via ORAL
  Filled 2017-08-08 (×3): qty 1

## 2017-08-08 MED ORDER — METOPROLOL TARTRATE 25 MG PO TABS: 12.5000 mg | ORAL_TABLET | Freq: Two times a day (BID) | ORAL | Status: DC

## 2017-08-08 MED ORDER — ASPIRIN EC 81 MG PO TBEC
81.0000 mg | DELAYED_RELEASE_TABLET | Freq: Every day | ORAL | Status: DC
  Filled 2017-08-08 (×3): qty 1

## 2017-08-08 MED ORDER — FUROSEMIDE 10 MG/ML IJ SOLN
40.0000 mg | Freq: Once | INTRAMUSCULAR | Status: AC
  Administered 2017-08-08: 40 mg via INTRAVENOUS
  Filled 2017-08-08: qty 4

## 2017-08-08 MED ORDER — GABAPENTIN 300 MG PO CAPS
600.0000 mg | ORAL_CAPSULE | Freq: Every day | ORAL | Status: DC
  Administered 2017-08-08 – 2017-08-10 (×3): 600 mg via ORAL
  Filled 2017-08-08 (×3): qty 2

## 2017-08-08 MED ORDER — SODIUM CHLORIDE 0.9% FLUSH: 3.0000 mL | INTRAVENOUS | Status: DC | PRN

## 2017-08-08 MED ORDER — GABAPENTIN 300 MG PO CAPS: 300.0000 mg | ORAL_CAPSULE | Freq: Two times a day (BID) | ORAL | Status: DC

## 2017-08-08 MED ORDER — POLYETHYLENE GLYCOL 3350 17 G PO PACK
17.0000 g | PACK | Freq: Every day | ORAL | Status: DC
  Administered 2017-08-09 – 2017-08-11 (×3): 17 g via ORAL
  Filled 2017-08-08 (×3): qty 1

## 2017-08-08 MED ORDER — DILTIAZEM HCL ER BEADS 300 MG PO CP24
300.0000 mg | ORAL_CAPSULE | Freq: Every day | ORAL | Status: DC
  Administered 2017-08-09 – 2017-08-11 (×3): 300 mg via ORAL
  Filled 2017-08-08 (×3): qty 1

## 2017-08-08 MED ORDER — METOPROLOL TARTRATE 25 MG PO TABS
25.0000 mg | ORAL_TABLET | Freq: Two times a day (BID) | ORAL | Status: DC
  Administered 2017-08-08 – 2017-08-11 (×6): 25 mg via ORAL
  Filled 2017-08-08 (×6): qty 1

## 2017-08-08 MED ORDER — APIXABAN 5 MG PO TABS
5.0000 mg | ORAL_TABLET | Freq: Two times a day (BID) | ORAL | Status: DC
  Administered 2017-08-08 – 2017-08-11 (×6): 5 mg via ORAL
  Filled 2017-08-08 (×6): qty 1

## 2017-08-08 MED ORDER — FUROSEMIDE 10 MG/ML IJ SOLN
40.0000 mg | Freq: Two times a day (BID) | INTRAMUSCULAR | Status: DC
  Administered 2017-08-08 – 2017-08-11 (×6): 40 mg via INTRAVENOUS
  Filled 2017-08-08 (×6): qty 4

## 2017-08-08 MED ORDER — GABAPENTIN 300 MG PO CAPS
300.0000 mg | ORAL_CAPSULE | Freq: Every morning | ORAL | Status: DC
  Administered 2017-08-09 – 2017-08-11 (×3): 300 mg via ORAL
  Filled 2017-08-08 (×3): qty 1

## 2017-08-08 MED ORDER — ACETAMINOPHEN 325 MG PO TABS
650.0000 mg | ORAL_TABLET | ORAL | Status: DC | PRN
  Administered 2017-08-10: 650 mg via ORAL
  Filled 2017-08-08: qty 2

## 2017-08-08 MED ORDER — FLECAINIDE ACETATE 50 MG PO TABS
150.0000 mg | ORAL_TABLET | Freq: Two times a day (BID) | ORAL | Status: DC
  Administered 2017-08-08: 22:00:00 150 mg via ORAL
  Filled 2017-08-08 (×4): qty 1

## 2017-08-08 MED ORDER — TAMSULOSIN HCL 0.4 MG PO CAPS
0.4000 mg | ORAL_CAPSULE | Freq: Every day | ORAL | Status: DC
  Administered 2017-08-09 – 2017-08-11 (×3): 0.4 mg via ORAL
  Filled 2017-08-08 (×3): qty 1

## 2017-08-08 MED ORDER — MORPHINE SULFATE ER 15 MG PO TBCR
15.0000 mg | EXTENDED_RELEASE_TABLET | Freq: Two times a day (BID) | ORAL | Status: DC
  Administered 2017-08-08 – 2017-08-11 (×6): 15 mg via ORAL
  Filled 2017-08-08 (×6): qty 1

## 2017-08-08 MED ORDER — FLECAINIDE ACETATE 100 MG PO TABS
ORAL_TABLET | ORAL | Status: AC
  Filled 2017-08-08: qty 2

## 2017-08-08 MED ORDER — SODIUM CHLORIDE 0.9 % IV SOLN: 250.0000 mL | INTRAVENOUS | Status: DC | PRN

## 2017-08-08 MED ORDER — ONDANSETRON HCL 4 MG/2ML IJ SOLN: 4.0000 mg | Freq: Four times a day (QID) | INTRAMUSCULAR | Status: DC | PRN

## 2017-08-08 MED ORDER — INSULIN ASPART 100 UNIT/ML ~~LOC~~ SOLN
0.0000 [IU] | Freq: Three times a day (TID) | SUBCUTANEOUS | Status: DC
  Administered 2017-08-10: 2 [IU] via SUBCUTANEOUS

## 2017-08-08 MED ORDER — PSYLLIUM 95 % PO PACK
1.0000 | PACK | Freq: Every day | ORAL | Status: DC
  Administered 2017-08-09 – 2017-08-10 (×2): 1 via ORAL
  Filled 2017-08-08 (×5): qty 1

## 2017-08-08 MED ORDER — SODIUM CHLORIDE 0.9% FLUSH
3.0000 mL | Freq: Two times a day (BID) | INTRAVENOUS | Status: DC
  Administered 2017-08-08 – 2017-08-11 (×6): 3 mL via INTRAVENOUS

## 2017-08-08 MED ORDER — DILTIAZEM HCL ER COATED BEADS 120 MG PO CP24
120.0000 mg | ORAL_CAPSULE | Freq: Every evening | ORAL | Status: DC
  Administered 2017-08-08 – 2017-08-10 (×3): 120 mg via ORAL
  Filled 2017-08-08 (×5): qty 1

## 2017-08-08 NOTE — ED Provider Notes (Signed)
[facility] EMERGENCY DEPARTMENT Provider Note   CSN: [id number] Arrival date & time: 08/08/17  1213     History   Chief Complaint Chief Complaint  Patient presents with  . Chest Pain    HPI [name]. is a 76 y.o. male.  HPI Patient presents with several weeks of progressive shortness of breath.  States shortness breath is worse with exertion.  Has had episodic chest pressure but currently denies any pain.  Denies cough, fever or chills.  Denies any new lower extremity swelling or pain.  Came to the emergency department 2 days ago and had initial workup performed but left before seeing a physician.  Without his primary physician's office today and was into the emergency department for concern for pacemaker malfunction. Past Medical History:  Diagnosis Date  . Atrial flutter ([place])    a. s/p ablation 01/2015.  Marland Kitchen BPH (benign prostatic hyperplasia)   . Cerebrovascular disease    carotid bruit; no focal stenosis by carotid ultrasound in 05/2008  . Congestive heart failure (CHF) ([place])   . Dysrhythmia   . General weakness    +malaise, exercise intolerance, and near syncope  . Hypertension   . Low back pain    history of traumax2  . Paroxysmal atrial fibrillation (HCC)   . Presence of permanent cardiac pacemaker 2009  . Sleep apnea    c pap at night  . Tachy-brady syndrome ([place])    a. s/p St. Jude pacemaker 2009.    Patient Active Problem List   Diagnosis Date Noted  . Lumbar radiculopathy 07/24/2017  . Pars defect of lumbar spine 07/24/2017  . Lumbar degenerative disc disease 07/24/2017  . Chronic diastolic CHF (congestive heart failure) ([place]) 06/06/2017  . Hx of colonic polyps 12/28/2016  . Paroxysmal atrial fibrillation (HCC)   . Fatigue 11/18/2015  . Chronic anticoagulation 11/18/2015  . Atrial fibrillation ([place]) 03/15/2015  . Dyspnea 12/25/2014  . OSA (obstructive sleep apnea) 09/02/2013  . Atrial flutter ([place]) 11/28/2012  . Hypertension 11/21/2011  .  Sick sinus syndrome ([place]) 06/13/2011  . HYPERLIPIDEMIA 02/17/2010  . ORTHOSTATIC DIZZINESS 02/17/2010  . Chest pain 02/17/2010  . PPM-St.Jude 06/16/2009  . LOW BACK PAIN, CHRONIC 07/31/2008  . Cerebrovascular disease 07/31/2008  . BENIGN PROSTATIC HYPERTROPHY, MILD, HX OF 07/31/2008    Past Surgical History:  Procedure Laterality Date  . ABLATION    . CATARACT EXTRACTION     Bilateral  . CERVICAL DISCECTOMY  2006  . COLONOSCOPY WITH PROPOFOL N/A 01/26/2017   Procedure: COLONOSCOPY WITH PROPOFOL;  Surgeon: [name], MD;  [facility];  Service: Endoscopy;  Laterality: N/A;  8:30  . ELECTROPHYSIOLOGIC STUDY N/A 01/18/2015   Procedure: A-Flutter/A-Tach/Svt Ablation;  Surgeon: [name], MD;  Location: [facility] INVASIVE CV LAB CUPID;  Service: Cardiovascular;  Laterality: N/A;  . KNEE ARTHROSCOPY WITH MEDIAL MENISECTOMY Left 06/03/2015   Procedure: KNEE ARTHROSCOPY WITH MEDIAL MENISECTOMY;  Surgeon: [name], MD;  Location: [facility] ORS;  Service: Orthopedics;  Laterality: Left;  . PACEMAKER INSERTION  1966  . POLYPECTOMY  01/26/2017   Procedure: POLYPECTOMY;  Surgeon: [name], MD;  Location: [facility]O [facility];  Service: Endoscopy;;  colon       Home Medications    Prior to Admission medications   Medication Sig Start Date End Date Taking? Authorizing Provider  acetaminophen (TYLENOL) 500 MG tablet Take 1,300 mg by mouth every 6 (six) hours as needed for mild pain.    Yes [provider]  apixaban (ELIQUIS) 5 MG TABS tablet Take 1 tablet (5 mg total) by mouth 2 (two) times daily. 08/09/16  Yes Kilroy, Luke K, PA-C  CALCIUM-MAGNESIUM-VITAMIN D PO Take 1 tablet by mouth at bedtime.    Yes [provider]  diltiazem (TIAZAC) 120 MG 24 hr capsule Take 120 mg by mouth every evening.    Yes [provider]  diltiazem (TIAZAC) 300 MG 24 hr capsule Take 300 mg by mouth daily.    Yes [provider]  flecainide (TAMBOCOR) 100 MG tablet  Take 1.5 tablets (150 mg total) by mouth 2 (two) times daily. 05/26/16  Yes [name], MD  furosemide (LASIX) 40 MG tablet Take 40 mg by mouth daily as needed (Take Daily if Wt over 245 lbs).  01/08/17  Yes [provider]  gabapentin (NEURONTIN) 300 MG capsule Take 300-600 mg by mouth 2 (two) times daily. 300 mg in the morning and 600 mg in the evening   Yes [provider]  hydrocortisone (ANUSOL-HC) 2.5 % rectal cream Place 1 application rectally as needed for hemorrhoids or itching.    Yes [provider]  ketoconazole (NIZORAL) 2 % cream Apply 1 application topically daily.   Yes [provider]  lidocaine (LIDODERM) 5 % Place 1 patch onto the skin daily. Remove & Discard patch within 12 hours or as directed by MD   Yes [provider]  metoprolol tartrate (LOPRESSOR) 25 MG tablet Take 0.5 tablets (12.5 mg total) by mouth 2 (two) times daily. 01/01/15  Yes [name], MD  morphine (MS CONTIN) 15 MG 12 hr tablet Take 15 mg by mouth every 12 (twelve) hours.   Yes [provider]  pantoprazole (PROTONIX) 40 MG tablet Take 40 mg by mouth daily.   Yes [provider]  polyethylene glycol (MIRALAX / GLYCOLAX) packet Take 17 g by mouth daily.   Yes [provider]  psyllium (METAMUCIL) 58.6 % powder Take 1 packet by mouth daily.    Yes [provider]  Tamsulosin HCl (FLOMAX) 0.4 MG CAPS Take 0.4 mg by mouth daily.    Yes [provider]  zinc oxide (BALMEX) 11.3 % CREA cream Apply 1 application topically 2 (two) times daily.   Yes [provider]    Family History Family History  Problem Relation Age of Onset  . Hypertension Unknown   . Neuropathy Neg Hx   . Stroke Neg Hx     Social History Social History   Tobacco Use  . Smoking status: Former Smoker    Packs/day: 1.00    Years: 0.50    Pack years: 0.50    Types: Cigarettes    Last attempt to quit: 03/27/1962    Years since  quitting: 55.4  . Smokeless tobacco: Never Used  Substance Use Topics  . Alcohol use: No    Alcohol/week: 0.0 oz  . Drug use: No     Allergies   Bee venom; Shellfish allergy; and Penicillins   Review of Systems Review of Systems  Constitutional: Positive for fatigue. Negative for chills and fever.  Eyes: Negative for visual disturbance.  Respiratory: Positive for shortness of breath. Negative for cough and wheezing.   Cardiovascular: Positive for chest pain and palpitations. Negative for leg swelling.  Gastrointestinal: Negative for abdominal pain, constipation, diarrhea, nausea and vomiting.  Genitourinary: Negative for dysuria, flank pain and frequency.  Musculoskeletal: Negative for back pain, myalgias, neck pain and neck stiffness.  Skin:  Negative for rash and wound.  Neurological: Positive for dizziness and light-headedness. Negative for syncope, weakness, numbness and headaches.  All other systems reviewed and are negative.    Physical Exam Updated Vital Signs BP 107/75 (BP Location: Left Arm)   Pulse 93   Resp (!) 24   Ht 5\' 8"  (1.727 m)   Wt 107.5 kg (237 lb)   SpO2 93%   BMI 36.04 kg/m   Physical Exam  Constitutional: He is oriented to person, place, and time. He appears well-developed and well-nourished.  Non-toxic appearance. He does not appear ill.  HENT:  Head: Normocephalic and atraumatic.  Mouth/Throat: Oropharynx is clear and moist.  Eyes: EOM are normal. Pupils are equal, round, and reactive to light.  Neck: Normal range of motion. Neck supple. No JVD present.  Cardiovascular: Regular rhythm. Tachycardia present. Exam reveals distant heart sounds.  Tachycardia  Pulmonary/Chest:  Increased respiratory effort.  Patient has crackles in bilateral bases  Abdominal: Soft. Bowel sounds are normal. There is no tenderness. There is no rebound and no guarding.  Musculoskeletal: Normal range of motion. He exhibits no edema or tenderness.       Right lower  leg: He exhibits no tenderness and no edema.       Left lower leg: He exhibits no tenderness and no edema.  Neurological: He is alert and oriented to person, place, and time.  Skin: Skin is warm and dry. No rash noted. No erythema.  Psychiatric: He has a normal mood and affect. His behavior is normal.  Nursing note and vitals reviewed.    ED Treatments / Results  Labs (all labs ordered are listed, but only abnormal results are displayed) Labs Reviewed  BASIC METABOLIC PANEL - Abnormal; Notable for the following components:      Result Value   Sodium 134 (*)    Chloride 97 (*)    Glucose, Bld 146 (*)    BUN 22 (*)    Creatinine, Ser 1.62 (*)    GFR calc non Af Amer 40 (*)    GFR calc Af Amer 46 (*)    All other components within normal limits  CBC - Abnormal; Notable for the following components:   RBC 4.19 (*)    All other components within normal limits  BRAIN NATRIURETIC PEPTIDE - Abnormal; Notable for the following components:   B Natriuretic Peptide 870.0 (*)    All other components within normal limits  TSH - Abnormal; Notable for the following components:   TSH 5.154 (*)    All other components within normal limits  TROPONIN I  HEPATIC FUNCTION PANEL  MAGNESIUM  URINALYSIS, ROUTINE W REFLEX MICROSCOPIC    EKG  EKG Interpretation  Date/Time:  Wednesday August 08 2017 12:26:30 EST Ventricular Rate:  113 PR Interval:    QRS Duration: 253 QT Interval:  444 QTC Calculation: 617 R Axis:   -101 Text Interpretation:  A-V dual-paced rhythm with some inhibition No further analysis attempted due to paced rhythm Baseline wander in lead(s) V6 Confirmed by Julianne Rice [id number]) on 08/08/2017 12:35:09 PM       Radiology Dg Chest 2 View  Result Date: 08/08/2017 CLINICAL DATA:  Chest pain with abnormal electrocardiogram. Cardiac arrhythmia. EXAM: CHEST  2 VIEW COMPARISON:  August 06, 2017. FINDINGS: Pacemaker leads are attached to the right atrium and right  ventricle, stable. There is slight cardiomegaly with mild pulmonary venous hypertension. There is fibrotic type change in the lung bases. There are areas of scattered  scarring with peribronchial thickening. There is no frank edema or consolidation. There is a stable small calcified granuloma in the right base. No adenopathy. There is aortic atherosclerosis. There is postoperative change in the lower cervical spine. There is degenerative change in the thoracic spine. IMPRESSION: There is a degree of underlying pulmonary vascular congestion. There is chronic interstitial thickening, primarily felt to be due to fibrosis. There is also evidence of chronic bronchitis. No airspace consolidation. There is a small calcified granuloma in the right base. There is aortic atherosclerosis. Pacemaker leads are attached the right atrium and right ventricle. No evident adenopathy. Aortic Atherosclerosis (ICD10-I70.0). Electronically Signed   By: Lowella Grip III M.D.   On: 08/08/2017 13:45    Procedures Procedures (including critical care time)  Medications Ordered in ED Medications  furosemide (LASIX) injection 40 mg (not administered)     Initial Impression / Assessment and Plan / ED Course  I have reviewed the triage vital signs and the nursing notes.  Pertinent labs & imaging results that were available during my care of the patient were reviewed by me and considered in my medical decision making (see chart for details).    Pacemaker interrogated and working properly.  Discussed with Dr. Harl Bowie.  Will evaluate patient in the emergency department.  Dr. Lorin Mercy, hospitalist, will admit.   Final Clinical Impressions(s) / ED Diagnoses   Final diagnoses:  Acute on chronic systolic congestive heart failure [facility])    ED Discharge Orders    None       Julianne Rice, MD 08/08/17 1544

## 2017-08-08 NOTE — Consult Note (Signed)
Cardiology Consultation:   Patient ID: [name].; [id number]; 1940/10/04   Admit date: 08/08/2017 Date of Consult: 08/08/2017  Primary Care Provider: [name], MD Primary Cardiologist: Dr [name] Primary Electrophysiologist:  Dr [name]   Patient Profile:   [name]. is a 76 y.o. male with a hx of PAF, sick sinus syndrome,  who is being seen today for the evaluation of SOB at the request of Dr [name].  History of Present Illness:   [name] 76 yo male history of PAF, aflutter s/p ablation, symptomatic bradycardia with pacemaker followed by Dr [name]. Has been on flecanide with occasional break through afib. Has been on eliquis and dilt, metoprol for his afib as well.  Presents with a several week history of progressive SOB/DOE, orthopnea. Has had intermittent dizziness and generalized weakness over this time. Denies any significant chest pain, no palpitations. Reports he has been compliant with his meds.    ER vitals: p 120, RR 24 118/76 94% 05/2017 stress test: Inferior/inferopaical infarct with mild peri-infarct ischemia. Low risk study CXR pulm edema K 4, Cr 1.62, Hgb 13, Plt 185, BNP 870 Trop neg UA pending 06/2017 echo: LVEF 50-55%, cannot eval diastolic function, mild MR EKG AV paced    Past Medical History:  Diagnosis Date  . Atrial flutter ([place])    a. s/p ablation 01/2015.  Marland Kitchen BPH (benign prostatic hyperplasia)   . Cerebrovascular disease    carotid bruit; no focal stenosis by carotid ultrasound in 05/2008  . Congestive heart failure (CHF) ([place])   . Dysrhythmia   . General weakness    +malaise, exercise intolerance, and near syncope  . Hypertension   . Low back pain    history of traumax2  . Paroxysmal atrial fibrillation (HCC)   . Presence of permanent cardiac pacemaker 2009  . Sleep apnea    c pap at night  . Tachy-brady syndrome ([place])    a. s/p St. Jude pacemaker 2009.    Past Surgical History:  Procedure Laterality Date    . ABLATION    . CATARACT EXTRACTION     Bilateral  . CERVICAL DISCECTOMY  2006  . COLONOSCOPY WITH PROPOFOL N/A 01/26/2017   Procedure: COLONOSCOPY WITH PROPOFOL;  Surgeon: Gregory Houston, MD;  [facility];  Service: Endoscopy;  Laterality: N/A;  8:30  . ELECTROPHYSIOLOGIC STUDY N/A 01/18/2015   Procedure: A-Flutter/A-Tach/Svt Ablation;  Surgeon: Gregory Lance, MD;  Location: [place] INVASIVE CV LAB CUPID;  Service: Cardiovascular;  Laterality: N/A;  . KNEE ARTHROSCOPY WITH MEDIAL MENISECTOMY Left 06/03/2015   Procedure: KNEE ARTHROSCOPY WITH MEDIAL MENISECTOMY;  Surgeon: Gregory Kava, MD;  Location: [facility] ORS;  Service: Orthopedics;  Laterality: Left;  . PACEMAKER INSERTION  1966  . POLYPECTOMY  01/26/2017   Procedure: POLYPECTOMY;  Surgeon: Gregory Houston, MD;  [facility];  Service: Endoscopy;;  colon      Inpatient Medications: Scheduled Meds:  Continuous Infusions:  PRN Meds:   Allergies:    Allergies  Allergen Reactions  . Bee Venom Anaphylaxis and Swelling  . Shellfish Allergy Hives    Accelerated allergic reaction; questionable laryngospasm or laryngeal edema with some difficulty breathing prompting ED evaluation and treatment at [facility]  . Penicillins Rash    Has patient had a PCN reaction causing immediate rash, facial/tongue/throat swelling, SOB or lightheadedness with hypotension: No Has patient had a PCN reaction causing severe rash involving mucus membranes or skin necrosis: No Has patient had a PCN reaction  that required hospitalization No Has patient had a PCN reaction occurring within the last 10 years: No If all of the above answers are "NO", then may proceed with Cephalosporin use.     Social History:   Social History   Socioeconomic History  . Marital status: Divorced    Spouse name: Not on file  . Number of children: 3  . Years of education: 12+  . Highest education level: Not on file  Social Needs  . Financial resource strain:  Not on file  . Food insecurity - worry: Not on file  . Food insecurity - inability: Not on file  . Transportation needs - medical: Not on file  . Transportation needs - non-medical: Not on file  Occupational History  . Occupation: Retired  Tobacco Use  . Smoking status: Former Smoker    Packs/day: 1.00    Years: 0.50    Pack years: 0.50    Types: Cigarettes    Last attempt to quit: 03/27/1962    Years since quitting: 55.4  . Smokeless tobacco: Never Used  Substance and Sexual Activity  . Alcohol use: No    Alcohol/week: 0.0 oz  . Drug use: No  . Sexual activity: Not on file  Other Topics Concern  . Not on file  Social History Narrative   Divorced with 3 adult children. Semi retired.    Caffeine use: once daily    Family History:    Family History  Problem Relation Age of Onset  . Hypertension Unknown   . Neuropathy Neg Hx   . Stroke Neg Hx      ROS:  Please see the history of present illness.  ROS  All other ROS reviewed and negative.     Physical Exam/Data:   Vitals:   08/08/17 1229 08/08/17 1230 08/08/17 1300 08/08/17 1330  BP:   118/76 107/76  Pulse: (!) 120  (!) 119 (!) 120  Resp: (!) 24  (!) 27 (!) 22  SpO2:   94% 99%  Weight:  237 lb (107.5 kg)    Height:  5\' 8"  (1.727 m)     No intake or output data in the 24 hours ending 08/08/17 1430 Filed Weights   08/08/17 1230  Weight: 237 lb (107.5 kg)   Body mass index is 36.04 kg/m.  General:  Well nourished, well developed, in no acute distress HEENT: normal Lymph: no adenopathy Neck: elevated JVD Endocrine:  No thryomegaly Cardiac:  Regular and tachy, S1, S2; ; no murmur Lungs:  Bilateral crackles Abd: soft, nontender, no hepatomegaly  Ext: no edema Musculoskeletal:  No deformities, BUE and BLE strength normal and equal Skin: warm and dry  Neuro:  CNs 2-12 intact, no focal abnormalities noted Psych:  Normal affect    Laboratory Data:  Chemistry Recent Labs  Lab 08/06/17 1514 08/08/17 1230   NA 134* 134*  K 4.0 4.0  CL 99* 97*  CO2 27 25  GLUCOSE 138* 146*  BUN 20 22*  CREATININE 1.49* 1.62*  CALCIUM 9.3 9.7  GFRNONAA 44* 40*  GFRAA 51* 46*  ANIONGAP 8 12    Recent Labs  Lab 08/08/17 1230  PROT 8.1  ALBUMIN 4.2  AST 28  ALT 21  ALKPHOS 74  BILITOT 0.7   Hematology Recent Labs  Lab 08/06/17 1514 08/08/17 1230  WBC 6.5 7.9  RBC 4.05* 4.19*  HGB 12.4* 13.0  HCT 40.1 41.0  MCV 99.0 97.9  MCH 30.6 31.0  MCHC 30.9 31.7  RDW  14.4 14.8  PLT 175 185   Cardiac Enzymes Recent Labs  Lab 08/06/17 1514 08/08/17 1230  TROPONINI <0.03 <0.03   No results for input(s): TROPIPOC in the last 168 hours.  BNP Recent Labs  Lab 08/08/17 1230  BNP 870.0*    DDimer No results for input(s): DDIMER in the last 168 hours.  Radiology/Studies:  Dg Chest 2 View  Result Date: 08/08/2017 CLINICAL DATA:  Chest pain with abnormal electrocardiogram. Cardiac arrhythmia. EXAM: CHEST  2 VIEW COMPARISON:  August 06, 2017. FINDINGS: Pacemaker leads are attached to the right atrium and right ventricle, stable. There is slight cardiomegaly with mild pulmonary venous hypertension. There is fibrotic type change in the lung bases. There are areas of scattered scarring with peribronchial thickening. There is no frank edema or consolidation. There is a stable small calcified granuloma in the right base. No adenopathy. There is aortic atherosclerosis. There is postoperative change in the lower cervical spine. There is degenerative change in the thoracic spine. IMPRESSION: There is a degree of underlying pulmonary vascular congestion. There is chronic interstitial thickening, primarily felt to be due to fibrosis. There is also evidence of chronic bronchitis. No airspace consolidation. There is a small calcified granuloma in the right base. There is aortic atherosclerosis. Pacemaker leads are attached the right atrium and right ventricle. No evident adenopathy. Aortic Atherosclerosis  (ICD10-I70.0). Electronically Signed   By: Lowella Grip III M.D.   On: 08/08/2017 13:45   Dg Chest 2 View  Result Date: 08/06/2017 CLINICAL DATA:  Dyspnea worsening over the past few days. Former smoker. EXAM: CHEST  2 VIEW COMPARISON:  01/08/2017 and 09/03/2016 FINDINGS: Stable cardiomegaly. Left-sided pacemaker apparatus with right atrial and right ventricular leads are re- demonstrated. There is aortic atherosclerosis at the arch without aneurysm. Chronic bilateral interstitial prominence may reflect chronic bronchitic change. Stable rounded 6 mm density is stable dating back to 2017 possibly representing a small granuloma or bone island versus vascular shadow on-end. Scarring and/or atelectasis along the right heart border and periphery of the right lower lobe. ACDF of the lower cervical spine. No acute nor suspicious osseous abnormality. IMPRESSION: Stable cardiomegaly with aortic atherosclerosis. Chronic bronchitic change of the lungs. 6 mm rounded nodular density in the lower lobe is stable dating back to 2017 likely representing a benign finding. Electronically Signed   By: Ashley Royalty M.D.   On: 08/06/2017 15:11    Assessment and Plan:   1. Acute on chronic diastolic HF -CXR with evidence of edema, elevated BNP, and evidence of fluid overload by exam - 06/2017 echo LVEF 50-55%, cannot eval diastllic function due to afib. Severe LAE would suggest significant dysfunction - given IV lasix 40mg  in Er, follow diuresis today and redose tomorrow based on labs, exam and symptoms - likely will need to increase his home lasix at discharge  2. Afib - 54% AF burden on last device check, longest duration 24 minutes.  - device check in ER does show episodes of afib. Currently his tachycardia is due to sinus tachycardia with the pacemaker sensing and ventricular pacing as it should. he is not currently in afib - defer to outpatient EP follow regarding continued flecanide - continue dilt dose,  would increase lopressor to 25mg  bid. - conitnue anticoagulation  3. AKI on CKD - probably due to venous congestion and CHF, follow with diuresis   4. Sinus tachycardia - device check today in ER shows current tachycardia in 120s is sinus tach, not afib or aflutter.  -  perhaps related to CHF. F/u TSH, UA. He is not anemic. Not currently in pain, or hypoxic - follow rates with management of his CHF.    For questions or updates, please contact [place] Please consult [email or web address] for contact info under Cardiology/STEMI.   Merrily Pew, MD  08/08/2017 2:30 PM

## 2017-08-08 NOTE — ED Notes (Signed)
St. Jude rep in room

## 2017-08-08 NOTE — ED Notes (Signed)
MD at bedside. 

## 2017-08-08 NOTE — Progress Notes (Signed)
Offered pt an [facility] CPAP.  Pt refused CPAP. Pt states that he will just wear his oxygen for the night. Pt educated on the importance of CPAP use but pt continues to refuse. Pt encouraged to call should he change his mind.

## 2017-08-08 NOTE — ED Notes (Signed)
Pt to xray

## 2017-08-08 NOTE — H&P (Signed)
History and Physical    [name]. JYN:[id number] DOB: Apr 11, 1941 DOA: 08/08/2017  PCP: [name], MD Consultants:  [place] PCP; Lovena Le - EP Patient coming from: Home - lives alone; [place]: oldest daughter, [phone]  Chief Complaint: SOB  HPI: [name]. is a 76 y.o. male with medical history significant of tachy-brady syndrome leading to pacemaker placement; OSA on CPAP; afib on Eliquis; chronic pain HTN; and mild HFrEF (EF 50-55% in 10/18, inability to assess diastolic dysfunction) presenting with SOB.  Dr. Willey Blade sent him to the ER.  He called his office 2 days ago and they suggested that he come to the ER.  He came to the ER and sat for 4 hours in the lobby and left without being seen.  He called yesterday and made an appointment with Dr. Willey Blade and went in this AM and he sent him back to the ER.  He has been feeling very fatigued for several weeks with some dizziness.  He thought it was related to his medications but he has been getting worse.  No chest pain but he has had "indigestion".  +SOB for at least a month.  He does wear CPAP nightly, setting is 5.  No cough.  No LE edema.  No palpitations.  He has lost the ability to stand up twice recently due to his legs giving out.   ED Course: Pacemaker interrogated and working properly.  Cardiology consulted.  Dr. Harl Bowie saw the patient and diagnosed acute on chronic diastolic HF with episodic afib and AKI on CKD along with sinus tachycardia.  Review of Systems: As per HPI; otherwise review of systems reviewed and negative.   Ambulatory Status:  Ambulates with a cane for the last week due to current illness  Past Medical History:  Diagnosis Date  . Atrial flutter ([place])    a. s/p ablation 01/2015.  Marland Kitchen BPH (benign prostatic hyperplasia)   . Cerebrovascular disease    carotid bruit; no focal stenosis by carotid ultrasound in 05/2008  . Congestive heart failure (CHF) ([place])   . Dysrhythmia   . General weakness    +malaise, exercise intolerance, and near syncope  . Hypertension   . Low back pain    history of traumax2  . Paroxysmal atrial fibrillation (HCC)    on Eliquis  . Presence of permanent cardiac pacemaker 2009  . Sleep apnea    c pap at night  . Tachy-brady syndrome ([place])    a. s/p [employer] Jude pacemaker 2009.    Past Surgical History:  Procedure Laterality Date  . ABLATION    . CATARACT EXTRACTION     Bilateral  . CERVICAL DISCECTOMY  2006  . COLONOSCOPY WITH PROPOFOL N/A 01/26/2017   Procedure: COLONOSCOPY WITH PROPOFOL;  Surgeon: [name], MD;  Location: [facility];  Service: Endoscopy;  Laterality: N/A;  8:30  . ELECTROPHYSIOLOGIC STUDY N/A 01/18/2015   Procedure: A-Flutter/A-Tach/Svt Ablation;  Surgeon: [name], MD;  Location: [facility];  Service: Cardiovascular;  Laterality: N/A;  . KNEE ARTHROSCOPY WITH MEDIAL MENISECTOMY Left 06/03/2015   Procedure: KNEE ARTHROSCOPY WITH MEDIAL MENISECTOMY;  Surgeon: [name], MD;  Location: [facility];  Service: Orthopedics;  Laterality: Left;  . PACEMAKER INSERTION  1966  . POLYPECTOMY  01/26/2017   Procedure: POLYPECTOMY;  Surgeon: [name], MD;  Location: [facility];  Service: Endoscopy;;  colon    Social History   Socioeconomic History  . Marital status:  Divorced    Spouse name: Not on file  . Number of children: 3  . Years of education: 12+  . Highest education level: Not on file  Social Needs  . Financial resource strain: Not on file  . Food insecurity - worry: Not on file  . Food insecurity - inability: Not on file  . Transportation needs - medical: Not on file  . Transportation needs - non-medical: Not on file  Occupational History  . Occupation: Retired  Tobacco Use  . Smoking status: Former Smoker    Packs/day: 1.00    Years: 0.50    Pack years: 0.50    Types: Cigarettes    Last attempt to quit: 03/27/1962    Years since quitting: 55.4  . Smokeless tobacco: Never Used    Substance and Sexual Activity  . Alcohol use: No    Alcohol/week: 0.0 oz  . Drug use: No  . Sexual activity: Not Currently  Other Topics Concern  . Not on file  Social History Narrative   Divorced with 3 adult children. Semi retired.    Caffeine use: once daily    Allergies  Allergen Reactions  . Bee Venom Anaphylaxis and Swelling  . Shellfish Allergy Hives    Accelerated allergic reaction; questionable laryngospasm or laryngeal edema with some difficulty breathing prompting ED evaluation and treatment at [facility]  . Penicillins Rash    Has patient had a PCN reaction causing immediate rash, facial/tongue/throat swelling, SOB or lightheadedness with hypotension: No Has patient had a PCN reaction causing severe rash involving mucus membranes or skin necrosis: No Has patient had a PCN reaction that required hospitalization No Has patient had a PCN reaction occurring within the last 10 years: No If all of the above answers are "NO", then may proceed with Cephalosporin use.     Family History  Problem Relation Age of Onset  . Hypertension Unknown   . Neuropathy Neg Hx   . Stroke Neg Hx     Prior to Admission medications   Medication Sig Start Date End Date Taking? Authorizing Provider  acetaminophen (TYLENOL) 500 MG tablet Take 1,300 mg by mouth every 6 (six) hours as needed for mild pain.    Yes [provider]  apixaban (ELIQUIS) 5 MG TABS tablet Take 1 tablet (5 mg total) by mouth 2 (two) times daily. 08/09/16  Yes Kilroy, Luke K, PA-C  CALCIUM-MAGNESIUM-VITAMIN D PO Take 1 tablet by mouth at bedtime.    Yes [provider]  diltiazem (TIAZAC) 120 MG 24 hr capsule Take 120 mg by mouth every evening.    Yes [provider]  diltiazem (TIAZAC) 300 MG 24 hr capsule Take 300 mg by mouth daily.    Yes [provider]  flecainide (TAMBOCOR) 100 MG tablet Take 1.5 tablets (150 mg total) by mouth 2 (two) times daily. 05/26/16  Yes [name], MD   furosemide (LASIX) 40 MG tablet Take 40 mg by mouth daily as needed (Take Daily if Wt over 245 lbs).  01/08/17  Yes [provider]  gabapentin (NEURONTIN) 300 MG capsule Take 300-600 mg by mouth 2 (two) times daily. 300 mg in the morning and 600 mg in the evening   Yes [provider]  hydrocortisone (ANUSOL-HC) 2.5 % rectal cream Place 1 application rectally as needed for hemorrhoids or itching.    Yes [provider]  ketoconazole (NIZORAL) 2 % cream Apply 1 application topically daily.   Yes [provider]  lidocaine (  LIDODERM) 5 % Place 1 patch onto the skin daily. Remove & Discard patch within 12 hours or as directed by MD   Yes [provider]  metoprolol tartrate (LOPRESSOR) 25 MG tablet Take 0.5 tablets (12.5 mg total) by mouth 2 (two) times daily. 01/01/15  Yes [name], MD  morphine (MS CONTIN) 15 MG 12 hr tablet Take 15 mg by mouth every 12 (twelve) hours.   Yes [provider]  pantoprazole (PROTONIX) 40 MG tablet Take 40 mg by mouth daily.   Yes [provider]  polyethylene glycol (MIRALAX / GLYCOLAX) packet Take 17 g by mouth daily.   Yes [provider]  psyllium (METAMUCIL) 58.6 % powder Take 1 packet by mouth daily.    Yes [provider]  Tamsulosin HCl (FLOMAX) 0.4 MG CAPS Take 0.4 mg by mouth daily.    Yes [provider]  zinc oxide (BALMEX) 11.3 % CREA cream Apply 1 application topically 2 (two) times daily.   Yes [provider]    Physical Exam: Vitals:   08/08/17 1542 08/08/17 1600 08/08/17 1700 08/08/17 1745  BP: 107/75 (!) 120/94 117/79 109/75  Pulse: 93 (!) 118 (!) 121 75  Resp: (!) 24 (!) 30 (!) 29   Temp:    98.6 F (37 C)  TempSrc:    Oral  SpO2: 93% 93% 96% 96%  Weight:    113.9 kg (251 lb)  Height:    5\' 8"  (1.727 m)     General: Appears calm and comfortable and is NAD; very protuberant abdomen Eyes:  PERRL, EOMI, normal lids, iris ENT:  Hard of  hearing, normal lips & tongue, mmm Neck:  no LAD, masses or thyromegaly; no carotid bruits Cardiovascular:  Sinus tachycardia, no m/r/g. 1+ LE edema.  Respiratory:   CTA bilaterally with no wheezes/rales/rhonchi; scant L>R basilar crackles.  Normal respiratory effort. Abdomen:  soft, NT, ND, NABS Back:   normal alignment, no CVAT Skin:  no rash or induration seen on limited exam Musculoskeletal:  grossly normal tone BUE/BLE, good ROM, no bony abnormality Lower extremity:  1+ LE edema.   2+ distal pulses. Psychiatric:  grossly normal mood and affect, speech fluent and appropriate, AOx3 Neurologic:  CN 2-12 grossly intact, moves all extremities in coordinated fashion, sensation intact    Radiological Exams on Admission: Dg Chest 2 View  Result Date: 08/08/2017 CLINICAL DATA:  Chest pain with abnormal electrocardiogram. Cardiac arrhythmia. EXAM: CHEST  2 VIEW COMPARISON:  August 06, 2017. FINDINGS: Pacemaker leads are attached to the right atrium and right ventricle, stable. There is slight cardiomegaly with mild pulmonary venous hypertension. There is fibrotic type change in the lung bases. There are areas of scattered scarring with peribronchial thickening. There is no frank edema or consolidation. There is a stable small calcified granuloma in the right base. No adenopathy. There is aortic atherosclerosis. There is postoperative change in the lower cervical spine. There is degenerative change in the thoracic spine. IMPRESSION: There is a degree of underlying pulmonary vascular congestion. There is chronic interstitial thickening, primarily felt to be due to fibrosis. There is also evidence of chronic bronchitis. No airspace consolidation. There is a small calcified granuloma in the right base. There is aortic atherosclerosis. Pacemaker leads are attached the right atrium and right ventricle. No evident adenopathy. Aortic Atherosclerosis (ICD10-I70.0). Electronically Signed   By: Lowella Grip  III M.D.   On: 08/08/2017 13:45    EKG:  Not available at this time in  Epic.  Per ER doctor - A-V dual paced rhythm with some inhibition, rate 113.   Labs on Admission: I have personally reviewed the available labs and imaging studies at the time of the admission.  Pertinent labs:   Glucose 146 BUN 22/Creatinine 1.62/GFR 40 - baseline creatinine about 1.40 BNP 870, no prior values available Troponin <0.03 TSH 5.154 UA WNL  Assessment/Plan Principal Problem:   Acute on chronic diastolic CHF (congestive heart failure) (HCC) Active Problems:   Hyperlipidemia   Hypertension   OSA (obstructive sleep apnea)   Chronic anticoagulation   Hyperglycemia   Acute kidney injury superimposed on chronic kidney disease (HCC)   CHF exacerbation -Patient presenting with worsening SOB and mild hypoxia -CXR consistent with mild pulmonary edema -Normal WBC count, no fever; will not give antibiotics at this time -Elevated BNP - uncertain baseline -Patient with tachycardia despite pacemaker but interrogation of pacer indicates that it is working appropriately; this appears to be sinus tachycardia which may be related to subacute exacerbation of CHF -With elevated BNP and abnl CXR, CHF seems most probable as diagnosis -Will admit with telemetry -Patient had recent Echo which showed mild systolic depression and probable diastolic dysfunction although this could not be adequately determined -Will start ASA - although with chronic anticoagulation this may not be needed on an ongoing basis -No ACE at this time due to renal dysfunction -Increase beta blocker dose to try to improve tachycardia (as per Dr. Harl Bowie) -CHF order set utilized -Was given Lasix 40 mg x 1 in ER and will repeat with 40 mg IV BID -Continue Opal O2 for now -Repeat EKG in AM -Will r/o with serial troponins although doubt ACS based on symptoms  AKI on CKD -Likely due to decreased renal perfusion in the setting of CHF  exacerbation -Follow up renal function by BMP as patient is being diuresed -Avoid ACEI and NSAIDs  Chronic anticoagulation -Patient with paroxysmal afib -On Dilt, BB, and flecainide -CHA2DS2-VASc score is >2, patient is on Eliquis  HTN -On Diltiazem, lopressor -Will increase dose of lopressor, as above  HLD -He does not appear to be taking statin therapy -LDL in 2013 was 104 -Will recheck lipid panel  OSA -Continue CPAP at home setting (5)  Hyperglycemia -May be stress response -Will follow with fasting AM labs and check A1c -It does not appear that he needs acute treatment for this issue currently  DVT prophylaxis: Eliquis Code Status:  Full - confirmed with patient Family Communication: None present  Disposition Plan:  Home once clinically improved Consults called: Cardiology; PT/OT/CM  Admission status: Admit - It is my clinical opinion that admission to INPATIENT is reasonable and necessary because this patient will require at least 2 midnights in the hospital to treat this condition based on the medical complexity of the problems presented.  Given the aforementioned information, the predictability of an adverse outcome is felt to be significant.    Karmen Bongo MD [facility]  If note is complete, please contact covering daytime or nighttime physician. [email or web address] Password [facility]  08/08/2017, 7:37 PM

## 2017-08-08 NOTE — ED Notes (Signed)
Pt requested to ambulate to the restroom. Pt steady on feet.

## 2017-08-08 NOTE — ED Notes (Signed)
Have spoken with [employer]. [employer] rep. States that he will send someone out in 30 mins to fix mechanical issue with pt.'s pacemaker.

## 2017-08-08 NOTE — ED Triage Notes (Signed)
Patient complains of chest pain x 1 week. Patient was seen at Dr. Ria Comment office and sent here due to abnormal EKG. Dr. Willey Blade called stating tachycardia and wide complexes. Patient has implanted pacemaker that may be malfunctioning.  [place] pacemaker.

## 2017-08-08 NOTE — ED Notes (Signed)
Have interrogated pt.'s pacemaker

## 2017-08-08 NOTE — ED Notes (Signed)
Dr Branch at bedside. ?

## 2017-08-09 DIAGNOSIS — I5033 Acute on chronic diastolic (congestive) heart failure: Secondary | ICD-10-CM

## 2017-08-09 DIAGNOSIS — N189 Chronic kidney disease, unspecified: Secondary | ICD-10-CM

## 2017-08-09 DIAGNOSIS — N179 Acute kidney failure, unspecified: Secondary | ICD-10-CM

## 2017-08-09 DIAGNOSIS — Z7901 Long term (current) use of anticoagulants: Secondary | ICD-10-CM

## 2017-08-09 LAB — BASIC METABOLIC PANEL
Anion gap: 10 (ref 5–15)
BUN: 25 mg/dL — ABNORMAL HIGH (ref 6–20)
CHLORIDE: 101 mmol/L (ref 101–111)
CO2: 28 mmol/L (ref 22–32)
CREATININE: 1.67 mg/dL — AB (ref 0.61–1.24)
Calcium: 9.1 mg/dL (ref 8.9–10.3)
GFR calc non Af Amer: 38 mL/min — ABNORMAL LOW (ref >60)
GFR, EST AFRICAN AMERICAN: 44 mL/min — AB (ref >60)
Glucose, Bld: 116 mg/dL — ABNORMAL HIGH (ref 65–99)
POTASSIUM: 3.9 mmol/L (ref 3.5–5.1)
SODIUM: 139 mmol/L (ref 135–145)

## 2017-08-09 LAB — LIPID PANEL
CHOL/HDL RATIO: 3.5 ratio
CHOLESTEROL: 106 mg/dL (ref 0–200)
HDL: 30 mg/dL — AB (ref >40)
LDL Cholesterol: 60 mg/dL (ref 0–99)
Triglycerides: 82 mg/dL (ref <150)
VLDL: 16 mg/dL (ref 0–40)

## 2017-08-09 LAB — CBC WITH DIFFERENTIAL/PLATELET
BASOS ABS: 0 10*3/uL (ref 0.0–0.1)
BASOS PCT: 0 %
EOS ABS: 0.1 10*3/uL (ref 0.0–0.7)
Eosinophils Relative: 1 %
HCT: 39.1 % (ref 39.0–52.0)
HEMOGLOBIN: 12.1 g/dL — AB (ref 13.0–17.0)
Lymphocytes Relative: 24 %
Lymphs Abs: 2.4 10*3/uL (ref 0.7–4.0)
MCH: 30.5 pg (ref 26.0–34.0)
MCHC: 30.9 g/dL (ref 30.0–36.0)
MCV: 98.5 fL (ref 78.0–100.0)
MONOS PCT: 9 %
Monocytes Absolute: 0.9 10*3/uL (ref 0.1–1.0)
NEUTROS ABS: 6.6 10*3/uL (ref 1.7–7.7)
NEUTROS PCT: 66 %
Platelets: 175 10*3/uL (ref 150–400)
RBC: 3.97 MIL/uL — ABNORMAL LOW (ref 4.22–5.81)
RDW: 14.8 % (ref 11.5–15.5)
WBC: 10 10*3/uL (ref 4.0–10.5)

## 2017-08-09 LAB — GLUCOSE, CAPILLARY
GLUCOSE-CAPILLARY: 107 mg/dL — AB (ref 65–99)
GLUCOSE-CAPILLARY: 111 mg/dL — AB (ref 65–99)
GLUCOSE-CAPILLARY: 99 mg/dL (ref 65–99)
Glucose-Capillary: 140 mg/dL — ABNORMAL HIGH (ref 65–99)

## 2017-08-09 LAB — TROPONIN I

## 2017-08-09 LAB — T4, FREE: FREE T4: 1.18 ng/dL — AB (ref 0.61–1.12)

## 2017-08-09 MED ORDER — ALPRAZOLAM 0.5 MG PO TABS
0.5000 mg | ORAL_TABLET | Freq: Once | ORAL | Status: AC
  Administered 2017-08-09: 0.5 mg via ORAL
  Filled 2017-08-09: qty 1

## 2017-08-09 MED ORDER — FLECAINIDE ACETATE 50 MG PO TABS
150.0000 mg | ORAL_TABLET | Freq: Two times a day (BID) | ORAL | Status: DC
  Administered 2017-08-09 – 2017-08-11 (×5): 150 mg via ORAL
  Filled 2017-08-09 (×9): qty 3

## 2017-08-09 MED ORDER — SENNA 8.6 MG PO TABS
2.0000 | ORAL_TABLET | Freq: Once | ORAL | Status: AC | PRN
  Administered 2017-08-09: 17.2 mg via ORAL
  Filled 2017-08-09: qty 2

## 2017-08-09 NOTE — Progress Notes (Signed)
Pt. Noted to be a little confused and stated that he felt confused. He was laying at the foot of the bed. He states that he is having a hard time breathing. He did not have on his O2. Pt was repositioned and O2 reapplied and pt sat up in at about 90 degrees. Vitals taken and found within normal limits. Pt advised to wear cpap tonight and pt agreed. Pt is a anxious. MD messaged. Camera operator and Nursing supervisor aware. Bed alarm in place. Call light within reach. Will continue to monitor.

## 2017-08-09 NOTE — Progress Notes (Signed)
Called to patient room. Patient having some difficulty catching breath. 02 was at [facility]. Patient agreed to wear CPAP. CPAP placed on 5cmH20 per home settings. Patient looks more comfortable. RT will continue to monitor.

## 2017-08-09 NOTE — Progress Notes (Signed)
Refuses CPAP. No unit in room.

## 2017-08-09 NOTE — Progress Notes (Signed)
PROGRESS NOTE    [name].  EXH:[id number] DOB: Jul 01, 1941 DOA: 08/08/2017 PCP: [name], MD     Brief Narrative:  Patient is a 76 year old man admitted from home on 11/21 with complaints of shortness of breath.  He has a history of tachybradycardia syndrome and is status post permanent pacemaker, he has obstructive sleep apnea and wears a CPAP, has atrial fibrillation on chronic anticoagulation with Eliquis and chronic diastolic heart failure.  He was found to have acute CHF exacerbation and admission was requested.   Assessment & Plan:   Principal Problem:   Acute on chronic diastolic CHF (congestive heart failure) (HCC) Active Problems:   Hyperlipidemia   Hypertension   OSA (obstructive sleep apnea)   Chronic anticoagulation   Hyperglycemia   Acute kidney injury superimposed on chronic kidney disease (HCC)   Acute on chronic diastolic CHF -Echo in October 2018 showed an ejection fraction of 50-55% but unable to assess diastolic dysfunction however severe left atrial enlargement would suggest significant dysfunction. -He has diuresed 1.8 L since admission however remains markedly volume overloaded on exam with lung crackles on auscultation as well as some lower extremity edema.  He also has positive JVD. -Plan to continue current Lasix dose of 40 mg IV twice daily since he seems to be responding well. -Creatinine may be slightly increased from 1.62 on admission to 1.67 today.  Baseline creatinine is around 1.3-1.49. -We will likely need increase in home Lasix dose prior to discharge. -Continue beta-blocker, not  on ACE inhibitor or ARB due to significant renal dysfunction at baseline.  Atrial fibrillation -Currently rate controlled on metoprolol and Cardizem. -Also on flecainide for rhythm control. -Anticoagulated on Eliquis.  Acute on chronic kidney disease stage III -Baseline creatinine around 1.3-1.4, is 1.6 today. -Monitor closely with  diuresis.  Obstructive sleep apnea -Supposed to wear CPAP at nighttime, however refused last night.  Benign essential hypertension -Well-controlled at present.  Hyperlipidemia -Patient is presently not on a statin. -His LDL is 60 with a total cholesterol of 106.   DVT prophylaxis: On Eliquis Code Status: Full code Family Communication: Patient only Disposition Plan: Anticipate discharge home in 48-72 hours  Consultants:   Cardiology  Procedures:   None  Antimicrobials:  Anti-infectives (From admission, onward)   None       Subjective: Feels a little improved, still short of breath with ambulation, states his back hurts but this is been a chronic issue since he was in the [employer] back in the 60s  Objective: Vitals:   08/08/17 1700 08/08/17 1745 08/08/17 2127 08/09/17 0505  BP: 117/79 109/75 110/78 118/69  Pulse: (!) 121 75 88 78  Resp: (!) 29  (!) 22 20  Temp:  98.6 F (37 C) 98.5 F (36.9 C) 98.4 F (36.9 C)  TempSrc:  Oral Oral Oral  SpO2: 96% 96% 95% 95%  Weight:  113.9 kg (251 lb)  113.8 kg (250 lb 14.8 oz)  Height:  5\' 8"  (1.727 m)      Intake/Output Summary (Last 24 hours) at 08/09/2017 1511 Last data filed at 08/09/2017 0959 Gross per 24 hour  Intake 363 ml  Output 2200 ml  Net -1837 ml   Filed Weights   08/08/17 1230 08/08/17 1745 08/09/17 0505  Weight: 107.5 kg (237 lb) 113.9 kg (251 lb) 113.8 kg (250 lb 14.8 oz)    Examination:  General exam: Alert, awake, oriented x 3 Respiratory system: Bilateral crackles, no wheezes Cardiovascular system:RRR. No murmurs, rubs,  gallops.,  Positive JVD Gastrointestinal system: Abdomen is nondistended, soft and nontender. No organomegaly or masses felt. Normal bowel sounds heard. Central nervous system: Alert and oriented. No focal neurological deficits. Extremities: 1-2+ pitting edema bilaterally Skin: No rashes, lesions or ulcers Psychiatry: Judgement and insight appear normal. Mood & affect  appropriate.     Data Reviewed: I have personally reviewed following labs and imaging studies  CBC: Recent Labs  Lab 08/06/17 1514 08/08/17 1230 08/09/17 0238  WBC 6.5 7.9 10.0  NEUTROABS  --   --  6.6  HGB 12.4* 13.0 12.1*  HCT 40.1 41.0 39.1  MCV 99.0 97.9 98.5  PLT 175 185 329   Basic Metabolic Panel: Recent Labs  Lab 08/06/17 1514 08/08/17 1230 08/09/17 0238  NA 134* 134* 139  K 4.0 4.0 3.9  CL 99* 97* 101  CO2 27 25 28   GLUCOSE 138* 146* 116*  BUN 20 22* 25*  CREATININE 1.49* 1.62* 1.67*  CALCIUM 9.3 9.7 9.1  MG  --  2.4  --    GFR: Estimated Creatinine Clearance: 46.1 mL/min (A) (by C-G formula based on SCr of 1.67 mg/dL (H)). Liver Function Tests: Recent Labs  Lab 08/08/17 1230  AST 28  ALT 21  ALKPHOS 74  BILITOT 0.7  PROT 8.1  ALBUMIN 4.2   No results for input(s): LIPASE, AMYLASE in the last 168 hours. No results for input(s): AMMONIA in the last 168 hours. Coagulation Profile: No results for input(s): INR, PROTIME in the last 168 hours. Cardiac Enzymes: Recent Labs  Lab 08/06/17 1514 08/08/17 1230 08/08/17 2133 08/09/17 0238 08/09/17 0912  TROPONINI <0.03 <0.03 <0.03 <0.03 <0.03   BNP (last 3 results) No results for input(s): PROBNP in the last 8760 hours. HbA1C: Recent Labs    08/08/17 1230  HGBA1C 6.0*   CBG: Recent Labs  Lab 08/08/17 2126 08/09/17 0750 08/09/17 1107  GLUCAP 109* 107* 111*   Lipid Profile: Recent Labs    08/09/17 0238  CHOL 106  HDL 30*  LDLCALC 60  TRIG 82  CHOLHDL 3.5   Thyroid Function Tests: Recent Labs    08/08/17 1230 08/08/17 2133  TSH 5.154*  --   FREET4  --  1.18*   Anemia Panel: No results for input(s): VITAMINB12, FOLATE, FERRITIN, TIBC, IRON, RETICCTPCT in the last 72 hours. Urine analysis:    Component Value Date/Time   COLORURINE YELLOW 08/08/2017 1307   APPEARANCEUR CLEAR 08/08/2017 1307   LABSPEC 1.019 08/08/2017 1307   PHURINE 5.0 08/08/2017 1307   GLUCOSEU NEGATIVE  08/08/2017 1307   HGBUR NEGATIVE 08/08/2017 [place] 08/08/2017 1307   KETONESUR NEGATIVE 08/08/2017 1307   PROTEINUR NEGATIVE 08/08/2017 1307   UROBILINOGEN 0.2 05/28/2015 1100   NITRITE NEGATIVE 08/08/2017 1307   LEUKOCYTESUR NEGATIVE 08/08/2017 1307   Sepsis Labs: @LABRCNTIP (procalcitonin:4,lacticidven:4)  )No results found for this or any previous visit (from the past 240 hour(s)).       Radiology Studies: Dg Chest 2 View  Result Date: 08/08/2017 CLINICAL DATA:  Chest pain with abnormal electrocardiogram. Cardiac arrhythmia. EXAM: CHEST  2 VIEW COMPARISON:  August 06, 2017. FINDINGS: Pacemaker leads are attached to the right atrium and right ventricle, stable. There is slight cardiomegaly with mild pulmonary venous hypertension. There is fibrotic type change in the lung bases. There are areas of scattered scarring with peribronchial thickening. There is no frank edema or consolidation. There is a stable small calcified granuloma in the right base. No adenopathy. There is aortic atherosclerosis.  There is postoperative change in the lower cervical spine. There is degenerative change in the thoracic spine. IMPRESSION: There is a degree of underlying pulmonary vascular congestion. There is chronic interstitial thickening, primarily felt to be due to fibrosis. There is also evidence of chronic bronchitis. No airspace consolidation. There is a small calcified granuloma in the right base. There is aortic atherosclerosis. Pacemaker leads are attached the right atrium and right ventricle. No evident adenopathy. Aortic Atherosclerosis (ICD10-I70.0). Electronically Signed   By: Lowella Grip III M.D.   On: 08/08/2017 13:45        Scheduled Meds: . apixaban  5 mg Oral BID  . aspirin EC  81 mg Oral Daily  . diltiazem  120 mg Oral QPM  . diltiazem  300 mg Oral Daily  . flecainide  150 mg Oral BID  . furosemide  40 mg Intravenous Q12H  . gabapentin  300 mg Oral q  morning - 10a  . gabapentin  600 mg Oral QHS  . insulin aspart  0-15 Units Subcutaneous TID WC  . metoprolol tartrate  25 mg Oral BID  . morphine  15 mg Oral Q12H  . pantoprazole  40 mg Oral Daily  . polyethylene glycol  17 g Oral Daily  . psyllium  1 packet Oral Daily  . sodium chloride flush  3 mL Intravenous Q12H  . tamsulosin  0.4 mg Oral Daily   Continuous Infusions: . sodium chloride       LOS: 1 day    Time spent: 35 minutes. Greater than 50% of this time was spent in direct contact with the patient coordinating care.     Lelon Frohlich, MD [facility] Pager [phone]  If 7PM-7AM, please contact night-coverage [email or web address] Password [email or web address] 08/09/2017, 3:11 PM

## 2017-08-10 LAB — GLUCOSE, CAPILLARY
GLUCOSE-CAPILLARY: 95 mg/dL (ref 65–99)
GLUCOSE-CAPILLARY: 143 mg/dL — AB (ref 65–99)
Glucose-Capillary: 106 mg/dL — ABNORMAL HIGH (ref 65–99)
Glucose-Capillary: 143 mg/dL — ABNORMAL HIGH (ref 65–99)

## 2017-08-10 LAB — BASIC METABOLIC PANEL
ANION GAP: 10 (ref 5–15)
BUN: 27 mg/dL — ABNORMAL HIGH (ref 6–20)
CHLORIDE: 95 mmol/L — AB (ref 101–111)
CO2: 28 mmol/L (ref 22–32)
Calcium: 8.9 mg/dL (ref 8.9–10.3)
Creatinine, Ser: 1.56 mg/dL — ABNORMAL HIGH (ref 0.61–1.24)
GFR calc Af Amer: 48 mL/min — ABNORMAL LOW (ref >60)
GFR, EST NON AFRICAN AMERICAN: 41 mL/min — AB (ref >60)
Glucose, Bld: 99 mg/dL (ref 65–99)
POTASSIUM: 4 mmol/L (ref 3.5–5.1)
SODIUM: 133 mmol/L — AB (ref 135–145)

## 2017-08-10 NOTE — Evaluation (Signed)
Occupational Therapy Evaluation Patient Details Name: [name]. MRN: [id number] DOB: 1941-04-18 Today's Date: 08/10/2017    History of Present Illness Gregory Vi. is a 76 y.o. male with medical history significant of tachy-brady syndrome leading to pacemaker placement; OSA on CPAP; afib on Eliquis; chronic pain HTN; and mild HFrEF (EF 50-55% in 10/18, inability to assess diastolic dysfunction) presenting with SOB.  Dr. Willey Blade sent him to the ER.  He called his office 2 days ago and they suggested that he come to the ER.  He came to the ER and sat for 4 hours in the lobby and left without being seen.  He called yesterday and made an appointment with Dr. Willey Blade and went in this AM and he sent him back to the ER.  He has been feeling very fatigued for several weeks with some dizziness.  He thought it was related to his medications but he has been getting worse.  No chest pain but he has had "indigestion".  +SOB for at least a month.  He does wear CPAP nightly, setting is 5.  No cough.  No LE edema.  No palpitations.  He has lost the ability to stand up twice recently due to his legs giving out.   Clinical Impression   Pt in restroom on OT arrival, agreeable to OT evaluation. Pt is independent at baseline, reports chronic low back pain and BLE weakness occasionally. During evaluation pt demonstrating independence in ADLs, supervision during functional mobility to manage O2 line. Pt is functioning at baseline with ADL completion, no further OT services required at this time.     Follow Up Recommendations  No OT follow up    Equipment Recommendations  None recommended by OT       Precautions / Restrictions Precautions Precautions: Fall Precaution Comments: reports legs give out occasionally Restrictions Weight Bearing Restrictions: No      Mobility Bed Mobility               General bed mobility comments: Pt in restroom on OT arrival  Transfers Overall transfer  level: Modified independent Equipment used: None                      ADL either performed or assessed with clinical judgement   ADL Overall ADL's : Needs assistance/impaired     Grooming: Wash/dry hands;Oral care;Modified independent;Standing           Upper Body Dressing : Modified independent;Sitting   Lower Body Dressing: Modified independent;Sit to/from stand   Toilet Transfer: Modified Independent;Grab bars   Toileting- Clothing Manipulation and Hygiene: Modified independent;Sit to/from stand       Functional mobility during ADLs: Supervision/safety       Vision Baseline Vision/History: No visual deficits Patient Visual Report: No change from baseline Vision Assessment?: No apparent visual deficits            Pertinent Vitals/Pain Pain Assessment: 0-10 Pain Score: 2  Pain Location: low back Pain Descriptors / Indicators: Aching Pain Intervention(s): Limited activity within patient's tolerance;Monitored during session;Repositioned     Hand Dominance Right   Extremity/Trunk Assessment Upper Extremity Assessment Upper Extremity Assessment: Overall WFL for tasks assessed   Lower Extremity Assessment Lower Extremity Assessment: Defer to PT evaluation   Cervical / Trunk Assessment Cervical / Trunk Assessment: Normal   Communication Communication Communication: No difficulties   Cognition Arousal/Alertness: Awake/alert Behavior During Therapy: WFL for tasks assessed/performed Overall Cognitive Status: Within Functional Limits for  tasks assessed                                                Home Living Family/patient expects to be discharged to:: Private residence Living Arrangements: Alone Available Help at Discharge: Family;Available PRN/intermittently Type of Home: House Home Access: Level entry     Home Layout: Two level Alternate Level Stairs-Number of Steps: 2 Alternate Level Stairs-Rails: None Bathroom  Shower/Tub: Teacher, early years/pre: Standard     Home Equipment: Environmental consultant - 2 wheels;Cane - single point;Grab bars - tub/shower;Hand held shower head          Prior Functioning/Environment Level of Independence: Independent with assistive device(s)        Comments: Pt has been using SPC for approximately 1 week        OT Problem List: Decreased activity tolerance       End of Session Nurse Communication: Mobility status  Activity Tolerance: Patient tolerated treatment well Patient left: in chair;with call bell/phone within reach  OT Visit Diagnosis: Muscle weakness (generalized) (M62.81)                Time: 0454-0981 OT Time Calculation (min): 31 min Charges:  OT General Charges $OT Visit: 1 Visit OT Evaluation $OT Eval Low Complexity: 1 Low    [name], OTR/L  [phone] 08/10/2017, 8:18 AM

## 2017-08-10 NOTE — Care Management Note (Addendum)
Case Management Note  Patient Details  Name: [name]. MRN: [id number] Date of Birth: [birth year]  Subjective/Objective:        Admitted with CHF. From home, lives alone, has children who [place] as support system. He has PCP and drives to appointments. Reports compliance with diet, weighing and medications. He is on oxygen acutely, anticipate he will be successfuly transitioned. PT recommends no follow up. Pt communicates no needs or concerns about discharge. Pt on [facility] registry, not active. 3 ED visits and 1 admission in 6 months.             Action/Plan: Home with self care. Referral made for [facility] emmi transition calls. No CM needs antiicpated, CM will follow to DC to ensure pt is weaned from oxygen.   Expected Discharge Date:  08/10/17               Expected Discharge Plan:  Home/Self Care  In-House Referral:  NA  Discharge planning Services  CM Consult  Status of Service:  In process, will continue to follow  If discussed at Long Length of Stay Meetings, dates discussed:    Additional Comments:  Sherald Barge, RN 08/10/2017, 1:59 PM

## 2017-08-10 NOTE — Progress Notes (Signed)
PROGRESS NOTE    [name].  HFW:[id number] DOB: [birth year] DOA: 08/08/2017 PCP: [name], MD     Brief Narrative:  76 year old man admitted from home on 75 of breath.  Was found to have acute CHF and admission was requested.  He has a past medical history significant for atrial fibrillation chronically anticoagulated on Eliquis, chronic diastolic heart failure, obstructive sleep apnea, tachybradycardia syndrome status post permanent pacemaker.   Assessment & Plan:   Principal Problem:   Acute on chronic diastolic CHF (congestive heart failure) (HCC) Active Problems:   Hyperlipidemia   Hypertension   OSA (obstructive sleep apnea)   Chronic anticoagulation   Hyperglycemia   Acute kidney injury superimposed on chronic kidney disease (HCC)   Acute on chronic diastolic CHF -Echo in October 2018 showed an ejection fraction of 50-55% but unable to access diastolic function.  However severe left atrial enlargement would suggest significant dysfunction. -He has diuresed a total of 2.9 L since admission, still remains with signs of volume overload including 2+ pedal edema and bilateral crackles on lung auscultation.  JVD has improved. -I will continue current dose of Lasix of 40 mg IV twice daily. -Likely need increase in home Lasix dose prior to discharge. -We will continue beta-blocker, not on an ACE inhibitor or ARB due to significant renal dysfunction at baseline.  Atrial fibrillation -Currently rate controlled on metoprolol and Cardizem. -Also on flecainide for rhythm control. -Anticoagulated on Eliquis.  Acute on chronic kidney disease stage III -Baseline creatinine is around 1.3-1.4, down to 1.56 today. -Monitor closely with diuresis.  Obstructive sleep apnea -Wears CPAP at bedtime.  Hyperlipidemia -Patient is presently not on a statin. -His LDL is 60 with a total cholesterol of 106.  Benign essential hypertension -Well-controlled.   DVT prophylaxis:  Eliquis Code Status: Full code Family Communication: Patient only Disposition Plan: Anticipate discharge home in 48-72 hours  Consultants:   Cardiology  Procedures:   None  Antimicrobials:  Anti-infectives (From admission, onward)   None       Subjective: Feels improved, less short of breath, although still not at baseline and still has an oxygen requirement.  Objective: Vitals:   08/09/17 2039 08/09/17 2333 08/10/17 0606 08/10/17 1319  BP: 112/67  103/60   Pulse: 70 66 84   Resp: 20  20   Temp: 97.7 F (36.5 C)  97.9 F (36.6 C) (!) 97.4 F (36.3 C)  TempSrc: Oral  Oral   SpO2: 96% 92% 93% 98%  Weight:   110.2 kg (243 lb)   Height:        Intake/Output Summary (Last 24 hours) at 08/10/2017 1513 Last data filed at 08/10/2017 1320 Gross per 24 hour  Intake 480 ml  Output 1950 ml  Net -1470 ml   Filed Weights   08/08/17 1745 08/09/17 0505 08/10/17 0606  Weight: 113.9 kg (251 lb) 113.8 kg (250 lb 14.8 oz) 110.2 kg (243 lb)    Examination:  General exam: Alert, awake, oriented x 3 Respiratory system: Bilateral crackles Cardiovascular system:RRR. No murmurs, rubs, gallops. Gastrointestinal system: Abdomen is nondistended, soft and nontender. No organomegaly or masses felt. Normal bowel sounds heard. Central nervous system: Alert and oriented. No focal neurological deficits. Extremities: 2+ pitting edema, positive pedal pulses Skin: No rashes, lesions or ulcers Psychiatry: Judgement and insight appear normal. Mood & affect appropriate.     Data Reviewed: I have personally reviewed following labs and imaging studies  CBC: Recent Labs  Lab 08/06/17 1514 08/08/17  1230 08/09/17 0238  WBC 6.5 7.9 10.0  NEUTROABS  --   --  6.6  HGB 12.4* 13.0 12.1*  HCT 40.1 41.0 39.1  MCV 99.0 97.9 98.5  PLT 175 185 130   Basic Metabolic Panel: Recent Labs  Lab 08/06/17 1514 08/08/17 1230 08/09/17 0238 08/10/17 0551  NA 134* 134* 139 133*  K 4.0 4.0 3.9 4.0    CL 99* 97* 101 95*  CO2 27 25 28 28   GLUCOSE 138* 146* 116* 99  BUN 20 22* 25* 27*  CREATININE 1.49* 1.62* 1.67* 1.56*  CALCIUM 9.3 9.7 9.1 8.9  MG  --  2.4  --   --    GFR: Estimated Creatinine Clearance: 48.5 mL/min (A) (by C-G formula based on SCr of 1.56 mg/dL (H)). Liver Function Tests: Recent Labs  Lab 08/08/17 1230  AST 28  ALT 21  ALKPHOS 74  BILITOT 0.7  PROT 8.1  ALBUMIN 4.2   No results for input(s): LIPASE, AMYLASE in the last 168 hours. No results for input(s): AMMONIA in the last 168 hours. Coagulation Profile: No results for input(s): INR, PROTIME in the last 168 hours. Cardiac Enzymes: Recent Labs  Lab 08/06/17 1514 08/08/17 1230 08/08/17 2133 08/09/17 0238 08/09/17 0912  TROPONINI <0.03 <0.03 <0.03 <0.03 <0.03   BNP (last 3 results) No results for input(s): PROBNP in the last 8760 hours. HbA1C: Recent Labs    08/08/17 1230  HGBA1C 6.0*   CBG: Recent Labs  Lab 08/09/17 1107 08/09/17 1632 08/09/17 2044 08/10/17 0755 08/10/17 1200  GLUCAP 111* 99 140* 95 106*   Lipid Profile: Recent Labs    08/09/17 0238  CHOL 106  HDL 30*  LDLCALC 60  TRIG 82  CHOLHDL 3.5   Thyroid Function Tests: Recent Labs    08/08/17 1230 08/08/17 2133  TSH 5.154*  --   FREET4  --  1.18*   Anemia Panel: No results for input(s): VITAMINB12, FOLATE, FERRITIN, TIBC, IRON, RETICCTPCT in the last 72 hours. Urine analysis:    Component Value Date/Time   COLORURINE YELLOW 08/08/2017 1307   APPEARANCEUR CLEAR 08/08/2017 1307   LABSPEC 1.019 08/08/2017 1307   PHURINE 5.0 08/08/2017 1307   GLUCOSEU NEGATIVE 08/08/2017 1307   HGBUR NEGATIVE 08/08/2017 [place] 08/08/2017 1307   KETONESUR NEGATIVE 08/08/2017 1307   PROTEINUR NEGATIVE 08/08/2017 1307   UROBILINOGEN 0.2 05/28/2015 1100   NITRITE NEGATIVE 08/08/2017 1307   LEUKOCYTESUR NEGATIVE 08/08/2017 1307   Sepsis Labs: @LABRCNTIP (procalcitonin:4,lacticidven:4)  )No results found  for this or any previous visit (from the past 240 hour(s)).       Radiology Studies: No results found.      Scheduled Meds: . apixaban  5 mg Oral BID  . aspirin EC  81 mg Oral Daily  . diltiazem  120 mg Oral QPM  . diltiazem  300 mg Oral Daily  . flecainide  150 mg Oral BID  . furosemide  40 mg Intravenous Q12H  . gabapentin  300 mg Oral q morning - 10a  . gabapentin  600 mg Oral QHS  . insulin aspart  0-15 Units Subcutaneous TID WC  . metoprolol tartrate  25 mg Oral BID  . morphine  15 mg Oral Q12H  . pantoprazole  40 mg Oral Daily  . polyethylene glycol  17 g Oral Daily  . psyllium  1 packet Oral Daily  . sodium chloride flush  3 mL Intravenous Q12H  . tamsulosin  0.4 mg Oral Daily  Continuous Infusions: . sodium chloride       LOS: 2 days    Time spent: 25 minutes. Greater than 50% of this time was spent in direct contact with the patient coordinating care.     Lelon Frohlich, MD [facility] Pager [phone]  If 7PM-7AM, please contact night-coverage [email or web address] Password [email or web address] 08/10/2017, 3:13 PM

## 2017-08-10 NOTE — Plan of Care (Signed)
Pt alert and oriented x4. Sitting in chair. Able to make needs known. No complaint of pain or distress. O2 1l, [place]. Telemetry in place. Lung sounds diminished bilaterally. Up with minimal assist. Visitor at bedside. Call light within reach. Bed in lowest position. Will continue to monitor.

## 2017-08-10 NOTE — Evaluation (Signed)
Physical Therapy Evaluation Patient Details Name: [name]. MRN: [id number] DOB: 22-Dec-1940 Today's Date: 08/10/2017   History of Present Illness  [name]. is a 76 y.o. male with medical history significant of tachy-brady syndrome leading to pacemaker placement; OSA on CPAP; afib on Eliquis; chronic pain HTN; and mild HFrEF (EF 50-55% in 10/18, inability to assess diastolic dysfunction) presenting with SOB.  Dr. Willey Blade sent him to the ER.  He called his office 2 days ago and they suggested that he come to the ER.  He came to the ER and sat for 4 hours in the lobby and left without being seen.  He called yesterday and made an appointment with Dr. Willey Blade and went in this AM and he sent him back to the ER.  He has been feeling very fatigued for several weeks with some dizziness.  He thought it was related to his medications but he has been getting worse.  No chest pain but he has had "indigestion".  +SOB for at least a month.  He does wear CPAP nightly, setting is 5.  No cough.  No LE edema.  No palpitations.  He has lost the ability to stand up twice recently due to his legs giving out.    Clinical Impression  Patient functioning near baseline for functional mobility and gait, ambulated on room air with O2 Saturation at 90%, no loss of balance, limited mostly due to frequent urination per patient.  Patient will benefit from continued physical therapy in hospital to increase strength/endurance for safe ADLs and gait.     Follow Up Recommendations No PT follow up    Equipment Recommendations  None recommended by PT    Recommendations for Other Services       Precautions / Restrictions Precautions Precautions: Fall Precaution Comments: monitor O2 sats Restrictions Weight Bearing Restrictions: No      Mobility  Bed Mobility Overal bed mobility: Independent             General bed mobility comments: Pt in restroom on OT arrival  Transfers Overall transfer  level: Modified independent Equipment used: None                Ambulation/Gait Ambulation/Gait assistance: Modified independent (Device/Increase time) Ambulation Distance (Feet): 75 Feet Assistive device: None Gait Pattern/deviations: WFL(Within Functional Limits)   Gait velocity interpretation: Below normal speed for age/gender General Gait Details: limited secondary to c/o fatigue  Stairs Stairs: Yes Stairs assistance: Modified independent (Device/Increase time) Stair Management: One rail Right Number of Stairs: 9 General stair comments: patient demonstrates good return for going up/down 9 steps without loss of balance  Wheelchair Mobility    Modified Rankin (Stroke Patients Only)       Balance Overall balance assessment: No apparent balance deficits (not formally assessed)                                           Pertinent Vitals/Pain Pain Assessment: 0-10 Pain Score: 5  Pain Location: chronic low back pain Pain Descriptors / Indicators: Aching Pain Intervention(s): Limited activity within patient's tolerance;Monitored during session    Home Living Family/patient expects to be discharged to:: Private residence Living Arrangements: Alone Available Help at Discharge: Family Type of Home: House Home Access: Level entry     Home Layout: One level Home Equipment: Environmental consultant - 2 wheels;Cane - single point;Grab bars -  tub/shower;Hand held shower head;Shower seat      Prior Function Level of Independence: Independent with assistive device(s)         Comments: Pt has been using SPC for approximately 1 week     Hand Dominance   Dominant Hand: Right    Extremity/Trunk Assessment   Upper Extremity Assessment Upper Extremity Assessment: Defer to OT evaluation    Lower Extremity Assessment Lower Extremity Assessment: Generalized weakness    Cervical / Trunk Assessment Cervical / Trunk Assessment: Normal  Communication    Communication: No difficulties  Cognition Arousal/Alertness: Awake/alert Behavior During Therapy: WFL for tasks assessed/performed Overall Cognitive Status: Within Functional Limits for tasks assessed                                        General Comments      Exercises     Assessment/Plan    PT Assessment Patient needs continued PT services  PT Problem List Decreased activity tolerance;Decreased mobility;Decreased strength       PT Treatment Interventions Gait training;Stair training;Functional mobility training;Therapeutic activities;Therapeutic exercise;Patient/family education    PT Goals (Current goals can be found in the Care Plan section)  Acute Rehab PT Goals Patient Stated Goal: return home PT Goal Formulation: With patient Time For Goal Achievement: 08/13/17 Potential to Achieve Goals: Good    Frequency Min 3X/week   Barriers to discharge        Co-evaluation               AM-PAC PT "6 Clicks" Daily Activity  Outcome Measure Difficulty turning over in bed (including adjusting bedclothes, sheets and blankets)?: None Difficulty moving from lying on back to sitting on the side of the bed? : None Difficulty sitting down on and standing up from a chair with arms (e.g., wheelchair, bedside commode, etc,.)?: None Help needed moving to and from a bed to chair (including a wheelchair)?: None Help needed walking in hospital room?: None Help needed climbing 3-5 steps with a railing? : None 6 Click Score: 24    End of Session   Activity Tolerance: Patient tolerated treatment well;Patient limited by fatigue Patient left: in bed;with call bell/phone within reach(seated at bedside) Nurse Communication: Mobility status PT Visit Diagnosis: Unsteadiness on feet (R26.81);Other abnormalities of gait and mobility (R26.89);Muscle weakness (generalized) (M62.81)    Time: 6962-9528 PT Time Calculation (min) (ACUTE ONLY): 27 min   Charges:   PT  Evaluation $PT Eval Low Complexity: 1 Low PT Treatments $Therapeutic Activity: 23-37 mins   PT G Codes:   PT G-Codes **NOT FOR INPATIENT CLASS** Functional Assessment Tool Used: AM-PAC 6 Clicks Basic Mobility Functional Limitation: Mobility: Walking and moving around Mobility: Walking and Moving Around Current Status (U1324): 0 percent impaired, limited or restricted Mobility: Walking and Moving Around Goal Status (M0102): 0 percent impaired, limited or restricted Mobility: Walking and Moving Around Discharge Status (V2536): 0 percent impaired, limited or restricted    12:02 PM, 08/10/17 Lonell Grandchild, MPT Physical Therapist with [facility] [phone] office [phone] mobile phone

## 2017-08-10 NOTE — Care Management Important Message (Signed)
Important Message  Patient Details  Name: [name]. MRN: [id number] Date of Birth: Oct 20, 1940   Medicare Important Message Given:  Yes    Sherald Barge, RN 08/10/2017, 1:58 PM

## 2017-08-10 NOTE — Plan of Care (Signed)
  Acute Rehab PT Goals(only PT should resolve) Pt Will Ambulate 08/10/2017 1204 - Progressing by Lonell Grandchild, PT Flowsheets Taken 08/10/2017 1204  Pt will Ambulate > 125 feet;Independently Pt Will Go Up/Down Stairs 08/10/2017 1204 - Progressing by Lonell Grandchild, PT Flowsheets Taken 08/10/2017 1204  Pt will Go Up / Down Stairs 6-9 stairs;Independently   12:04 PM, 08/10/17 Lonell Grandchild, MPT Physical Therapist with [facility] [phone] office [phone] mobile phone

## 2017-08-11 LAB — GLUCOSE, CAPILLARY
GLUCOSE-CAPILLARY: 91 mg/dL (ref 65–99)
GLUCOSE-CAPILLARY: 116 mg/dL — AB (ref 65–99)

## 2017-08-11 LAB — BASIC METABOLIC PANEL
ANION GAP: 10 (ref 5–15)
BUN: 26 mg/dL — AB (ref 6–20)
CALCIUM: 8.9 mg/dL (ref 8.9–10.3)
CO2: 30 mmol/L (ref 22–32)
Chloride: 97 mmol/L — ABNORMAL LOW (ref 101–111)
Creatinine, Ser: 1.46 mg/dL — ABNORMAL HIGH (ref 0.61–1.24)
GFR calc Af Amer: 52 mL/min — ABNORMAL LOW (ref >60)
GFR, EST NON AFRICAN AMERICAN: 45 mL/min — AB (ref >60)
Glucose, Bld: 89 mg/dL (ref 65–99)
POTASSIUM: 3.1 mmol/L — AB (ref 3.5–5.1)
SODIUM: 137 mmol/L (ref 135–145)

## 2017-08-11 MED ORDER — FUROSEMIDE 20 MG PO TABS: 60.0000 mg | ORAL_TABLET | Freq: Every day | ORAL | 2 refills | Status: DC

## 2017-08-11 MED ORDER — ASPIRIN 81 MG PO TBEC: 81.0000 mg | DELAYED_RELEASE_TABLET | Freq: Every day | ORAL | Status: DC

## 2017-08-11 NOTE — Discharge Summary (Signed)
Physician Discharge Summary  [name]. EQA:[id number] DOB: [birth year] DOA: 08/08/2017  PCP: [name], MD  Admit date: 08/08/2017 Discharge date: 08/11/2017  Time spent: 45 minutes  Recommendations for Outpatient Follow-up:  -To be discharged home today. -Advised to follow-up with PCP or cardiology within 2 weeks. -His Lasix dose has been increased from 40 mg to 60 mg daily.  Discharge Diagnoses:  Principal Problem:   Acute on chronic diastolic CHF (congestive heart failure) (HCC) Active Problems:   Hyperlipidemia   Hypertension   OSA (obstructive sleep apnea)   Chronic anticoagulation   Hyperglycemia   Acute kidney injury superimposed on chronic kidney disease [facility])   Discharge Condition: Stable and improved  Filed Weights   08/09/17 0505 08/10/17 0606 08/11/17 0527  Weight: 113.8 kg (250 lb 14.8 oz) 110.2 kg (243 lb) 112 kg (246 lb 14.4 oz)    History of present illness:  As per Dr. Lorin Mercy on 11/21:  [name]. is a 76 y.o. male with medical history significant of tachy-brady syndrome leading to pacemaker placement; OSA on CPAP; afib on Eliquis; chronic pain HTN; and mild HFrEF (EF 50-55% in 10/18, inability to assess diastolic dysfunction) presenting with SOB.  Dr. Willey Blade sent him to the ER.  He called his office 2 days ago and they suggested that he come to the ER.  He came to the ER and sat for 4 hours in the lobby and left without being seen.  He called yesterday and made an appointment with Dr. Willey Blade and went in this AM and he sent him back to the ER.  He has been feeling very fatigued for several weeks with some dizziness.  He thought it was related to his medications but he has been getting worse.  No chest pain but he has had "indigestion".  +SOB for at least a month.  He does wear CPAP nightly, setting is 5.  No cough.  No LE edema.  No palpitations.  He has lost the ability to stand up twice recently due to his legs giving out.   ED  Course: Pacemaker interrogated and working properly.  Cardiology consulted.  Dr. Harl Bowie saw the patient and diagnosed acute on chronic diastolic HF with episodic afib and AKI on CKD along with sinus tachycardia    Hospital Course:   Acute on chronic diastolic CHF -Echo in October 2018 showed an ejection fraction of 50-55% but unable to access diastolic function.  However severe left atrial enlargement would suggest significant dysfunction. -He has diuresed a total of 3 L since admission, and appears euvolemic at time of DC. -I will increase his home Lasix dose from 40-60 mg daily as per cardiology recommendations. -We will continue beta-blocker, not on an ACE inhibitor or ARB due to significant renal dysfunction at baseline.  Atrial fibrillation -Currently rate controlled on metoprolol and Cardizem. -Also on flecainide for rhythm control. -Anticoagulated on Eliquis.  Acute on chronic kidney disease stage III -Baseline creatinine is around 1.3-1.4, back down to baseline at 1.46 on discharge. -Due to cardiorenal syndrome.  Obstructive sleep apnea -Wears CPAP at bedtime.  Hyperlipidemia -Patient is presently not on a statin. -His LDL is 60 with a total cholesterol of 106.  Benign essential hypertension -Well-controlled.      Procedures:  None  Consultations:  Cardiology  Discharge Instructions  Discharge Instructions    Diet - low sodium heart healthy   Complete by:  As directed    Increase activity slowly  Complete by:  As directed      Allergies as of 08/11/2017      Reactions   Bee Venom Anaphylaxis, Swelling   Shellfish Allergy Hives   Accelerated allergic reaction; questionable laryngospasm or laryngeal edema with some difficulty breathing prompting ED evaluation and treatment at [facility]   Penicillins Rash   Has patient had a PCN reaction causing immediate rash, facial/tongue/throat swelling, SOB or lightheadedness with hypotension: No Has patient  had a PCN reaction causing severe rash involving mucus membranes or skin necrosis: No Has patient had a PCN reaction that required hospitalization No Has patient had a PCN reaction occurring within the last 10 years: No If all of the above answers are "NO", then may proceed with Cephalosporin use.      Medication List    TAKE these medications   acetaminophen 500 MG tablet Commonly known as:  TYLENOL Take 1,300 mg by mouth every 6 (six) hours as needed for mild pain.   apixaban 5 MG Tabs tablet Commonly known as:  ELIQUIS Take 1 tablet (5 mg total) by mouth 2 (two) times daily.   aspirin 81 MG EC tablet Take 1 tablet (81 mg total) by mouth daily. Start taking on:  08/12/2017   CALCIUM-MAGNESIUM-VITAMIN D PO Take 1 tablet by mouth at bedtime.   diltiazem 300 MG 24 hr capsule Commonly known as:  TIAZAC Take 300 mg by mouth daily.   diltiazem 120 MG 24 hr capsule Commonly known as:  TIAZAC Take 120 mg by mouth every evening.   flecainide 100 MG tablet Commonly known as:  TAMBOCOR Take 1.5 tablets (150 mg total) by mouth 2 (two) times daily.   furosemide 20 MG tablet Commonly known as:  LASIX Take 3 tablets (60 mg total) by mouth daily. What changed:    medication strength  how much to take  when to take this  reasons to take this   gabapentin 300 MG capsule Commonly known as:  NEURONTIN Take 300-600 mg by mouth 2 (two) times daily. 300 mg in the morning and 600 mg in the evening   hydrocortisone 2.5 % rectal cream Commonly known as:  ANUSOL-HC Place 1 application rectally as needed for hemorrhoids or itching.   ketoconazole 2 % cream Commonly known as:  NIZORAL Apply 1 application topically daily.   lidocaine 5 % Commonly known as:  LIDODERM Place 1 patch onto the skin daily. Remove & Discard patch within 12 hours or as directed by MD   metoprolol tartrate 25 MG tablet Commonly known as:  LOPRESSOR Take 0.5 tablets (12.5 mg total) by mouth 2 (two) times  daily.   morphine 15 MG 12 hr tablet Commonly known as:  MS CONTIN Take 15 mg by mouth every 12 (twelve) hours.   pantoprazole 40 MG tablet Commonly known as:  PROTONIX Take 40 mg by mouth daily.   polyethylene glycol packet Commonly known as:  MIRALAX / GLYCOLAX Take 17 g by mouth daily.   psyllium 58.6 % powder Commonly known as:  METAMUCIL Take 1 packet by mouth daily.   tamsulosin 0.4 MG Caps capsule Commonly known as:  FLOMAX Take 0.4 mg by mouth daily.   zinc oxide 11.3 % Crea cream Commonly known as:  BALMEX Apply 1 application topically 2 (two) times daily.      Allergies  Allergen Reactions  . Bee Venom Anaphylaxis and Swelling  . Shellfish Allergy Hives    Accelerated allergic reaction; questionable laryngospasm or laryngeal edema with some difficulty  breathing prompting ED evaluation and treatment at [facility]  . Penicillins Rash    Has patient had a PCN reaction causing immediate rash, facial/tongue/throat swelling, SOB or lightheadedness with hypotension: No Has patient had a PCN reaction causing severe rash involving mucus membranes or skin necrosis: No Has patient had a PCN reaction that required hospitalization No Has patient had a PCN reaction occurring within the last 10 years: No If all of the above answers are "NO", then may proceed with Cephalosporin use.    Follow-up Information    [name], MD. Schedule an appointment as soon as possible for a visit in 2 week(s).   Specialty:  Internal Medicine Contact information: [address] [place] [address] [phone]            The results of significant diagnostics from this hospitalization (including imaging, microbiology, ancillary and laboratory) are listed below for reference.    Significant Diagnostic Studies: Dg Chest 2 View  Result Date: 08/08/2017 CLINICAL DATA:  Chest pain with abnormal electrocardiogram. Cardiac arrhythmia. EXAM: CHEST  2 VIEW COMPARISON:  August 06, 2017. FINDINGS: Pacemaker leads are attached to the right atrium and right ventricle, stable. There is slight cardiomegaly with mild pulmonary venous hypertension. There is fibrotic type change in the lung bases. There are areas of scattered scarring with peribronchial thickening. There is no frank edema or consolidation. There is a stable small calcified granuloma in the right base. No adenopathy. There is aortic atherosclerosis. There is postoperative change in the lower cervical spine. There is degenerative change in the thoracic spine. IMPRESSION: There is a degree of underlying pulmonary vascular congestion. There is chronic interstitial thickening, primarily felt to be due to fibrosis. There is also evidence of chronic bronchitis. No airspace consolidation. There is a small calcified granuloma in the right base. There is aortic atherosclerosis. Pacemaker leads are attached the right atrium and right ventricle. No evident adenopathy. Aortic Atherosclerosis (ICD10-I70.0). Electronically Signed   By: Lowella Grip III M.D.   On: 08/08/2017 13:45   Dg Chest 2 View  Result Date: 08/06/2017 CLINICAL DATA:  Dyspnea worsening over the past few days. Former smoker. EXAM: CHEST  2 VIEW COMPARISON:  01/08/2017 and 09/03/2016 FINDINGS: Stable cardiomegaly. Left-sided pacemaker apparatus with right atrial and right ventricular leads are re- demonstrated. There is aortic atherosclerosis at the arch without aneurysm. Chronic bilateral interstitial prominence may reflect chronic bronchitic change. Stable rounded 6 mm density is stable dating back to 2017 possibly representing a small granuloma or bone island versus vascular shadow on-end. Scarring and/or atelectasis along the right heart border and periphery of the right lower lobe. ACDF of the lower cervical spine. No acute nor suspicious osseous abnormality. IMPRESSION: Stable cardiomegaly with aortic atherosclerosis. Chronic bronchitic change of the lungs. 6  mm rounded nodular density in the lower lobe is stable dating back to 2017 likely representing a benign finding. Electronically Signed   By: Ashley Royalty M.D.   On: 08/06/2017 15:11    Microbiology: No results found for this or any previous visit (from the past 240 hour(s)).   Labs: Basic Metabolic Panel: Recent Labs  Lab 08/06/17 1514 08/08/17 1230 08/09/17 0238 08/10/17 0551 08/11/17 0430  NA 134* 134* 139 133* 137  K 4.0 4.0 3.9 4.0 3.1*  CL 99* 97* 101 95* 97*  CO2 27 25 28 28 30   GLUCOSE 138* 146* 116* 99 89  BUN 20 22* 25* 27* 26*  CREATININE 1.49* 1.62* 1.67* 1.56* 1.46*  CALCIUM  9.3 9.7 9.1 8.9 8.9  MG  --  2.4  --   --   --    Liver Function Tests: Recent Labs  Lab 08/08/17 1230  AST 28  ALT 21  ALKPHOS 74  BILITOT 0.7  PROT 8.1  ALBUMIN 4.2   No results for input(s): LIPASE, AMYLASE in the last 168 hours. No results for input(s): AMMONIA in the last 168 hours. CBC: Recent Labs  Lab 08/06/17 1514 08/08/17 1230 08/09/17 0238  WBC 6.5 7.9 10.0  NEUTROABS  --   --  6.6  HGB 12.4* 13.0 12.1*  HCT 40.1 41.0 39.1  MCV 99.0 97.9 98.5  PLT 175 185 175   Cardiac Enzymes: Recent Labs  Lab 08/06/17 1514 08/08/17 1230 08/08/17 2133 08/09/17 0238 08/09/17 0912  TROPONINI <0.03 <0.03 <0.03 <0.03 <0.03   BNP: BNP (last 3 results) Recent Labs    08/08/17 1230  BNP 870.0*    ProBNP (last 3 results) No results for input(s): PROBNP in the last 8760 hours.  CBG: Recent Labs  Lab 08/10/17 1200 08/10/17 1632 08/10/17 2022 08/11/17 0734 08/11/17 1114  GLUCAP 106* 143* 143* 91 116*       Signed:  Lelon Frohlich  [facility] Pager: [phone] 08/11/2017, 2:08 PM

## 2017-08-11 NOTE — Progress Notes (Signed)
Patient discharged home with personal belongings. IV removed and site intact. Patient discharged with prescriptions.  

## 2017-08-17 ENCOUNTER — Other Ambulatory Visit: Payer: Self-pay

## 2017-08-17 NOTE — Patient Outreach (Signed)
[facility] [facility]) Care Management  08/17/2017  Janoah Menna. 04-16-1941 [id number]   EMMI: Heart failure Referral date: 08/17/17 Referral source: EMMI heart failure red alert Referral reason: weight 239 lbs. Day # 3  PROVIDERS: Dr. Willey Blade primary MD  Dr. Crissie Sickles - Cardiologist.   Telephone call to patient regarding EMMI heart failure red alert. HIPAA verified. Discussed EMMI heart failure with patient. Patient states his weight was 239 lbs on yesterday. Patient states he weighed with his pajamas on yesterday. Patient reports his weight today is 236 lbs. Patient states he weighed nude today. Patient denies any increase shortness of breath or swelling. Patient reports he has an appointment scheduled with his primary MD on 08/21/17. Patient states he has a cardiology follow up the week following his primary MD appointment. Patient states he is also followed by the [place].  Patient reports having all of his medications and taking them as prescribed. Patient states he can afford his medications due to the [place] providing.  Patient reports he is independent in his care and can transport himself to his doctor appointments.  Patient denies having any services in the home.  RNCM  offered [facility] care management program to patient. Patient verbally agreed to Health coach follow up for heart failure education.  RNCM advised patient to notify MD of any changes in condition prior to scheduled appointment. RNCM reviewed signs and symptoms of heart failure with patient.  RNCM provided contact name and number: [phone] or main office number [phone] and 24 hour nurse advise line [phone].  RNCM verified patient aware of 911 services for urgent/ emergent needs. RNCM reviewed signs and symptoms of heart failure with patient.   ASSESSMENT: Per patients chart.  Admit date: 08/08/2017 Discharge date: 08/11/2017  Discharge Diagnoses:  Principal Problem:   Acute on chronic  diastolic CHF (congestive heart failure) (HCC) Active Problems:   Hyperlipidemia   Hypertension   OSA (obstructive sleep apnea)   Chronic anticoagulation   Hyperglycemia   Acute kidney injury superimposed on chronic kidney disease ([facility])   76 y.o.malewith medical history significant oftachy-brady syndrome leading to pacemaker placement; OSA on CPAP; afib on Eliquis; chronic pain HTN; and mild HFrEF (EF 50-55% in 10/18, inability to assess diastolic dysfunction)  PLAN: RNCM will refer patient to health coach.   Quinn Plowman RN,BSN,CCM [facility] Telephonic  [phone]

## 2017-08-18 ENCOUNTER — Inpatient Hospital Stay (HOSPITAL_COMMUNITY)
Admission: EM | Admit: 2017-08-18 | Discharge: 2017-08-20 | DRG: 291 | Disposition: A | Discharge status: Home Health | Payer: Medicare Other | Attending: Internal Medicine | Admitting: Internal Medicine

## 2017-08-18 ENCOUNTER — Emergency Department (HOSPITAL_COMMUNITY): Payer: Medicare Other

## 2017-08-18 ENCOUNTER — Encounter (HOSPITAL_COMMUNITY): Payer: Self-pay

## 2017-08-18 ENCOUNTER — Other Ambulatory Visit: Payer: Self-pay

## 2017-08-18 DIAGNOSIS — Z91013 Allergy to seafood: Secondary | ICD-10-CM

## 2017-08-18 DIAGNOSIS — R0602 Shortness of breath: Secondary | ICD-10-CM | POA: Diagnosis not present

## 2017-08-18 DIAGNOSIS — N183 Chronic kidney disease, stage 3 unspecified: Secondary | ICD-10-CM | POA: Diagnosis present

## 2017-08-18 DIAGNOSIS — G8929 Other chronic pain: Secondary | ICD-10-CM | POA: Diagnosis not present

## 2017-08-18 DIAGNOSIS — Z9841 Cataract extraction status, right eye: Secondary | ICD-10-CM

## 2017-08-18 DIAGNOSIS — Z91030 Bee allergy status: Secondary | ICD-10-CM

## 2017-08-18 DIAGNOSIS — I130 Hypertensive heart and chronic kidney disease with heart failure and stage 1 through stage 4 chronic kidney disease, or unspecified chronic kidney disease: Principal | ICD-10-CM | POA: Diagnosis present

## 2017-08-18 DIAGNOSIS — N400 Enlarged prostate without lower urinary tract symptoms: Secondary | ICD-10-CM | POA: Diagnosis not present

## 2017-08-18 DIAGNOSIS — G4733 Obstructive sleep apnea (adult) (pediatric): Secondary | ICD-10-CM | POA: Diagnosis present

## 2017-08-18 DIAGNOSIS — M545 Low back pain: Secondary | ICD-10-CM | POA: Diagnosis not present

## 2017-08-18 DIAGNOSIS — Z9842 Cataract extraction status, left eye: Secondary | ICD-10-CM

## 2017-08-18 DIAGNOSIS — Z880 Allergy status to penicillin: Secondary | ICD-10-CM | POA: Diagnosis not present

## 2017-08-18 DIAGNOSIS — J9601 Acute respiratory failure with hypoxia: Secondary | ICD-10-CM | POA: Diagnosis not present

## 2017-08-18 DIAGNOSIS — E785 Hyperlipidemia, unspecified: Secondary | ICD-10-CM | POA: Diagnosis present

## 2017-08-18 DIAGNOSIS — G894 Chronic pain syndrome: Secondary | ICD-10-CM | POA: Diagnosis present

## 2017-08-18 DIAGNOSIS — I5033 Acute on chronic diastolic (congestive) heart failure: Secondary | ICD-10-CM | POA: Diagnosis present

## 2017-08-18 DIAGNOSIS — Z7982 Long term (current) use of aspirin: Secondary | ICD-10-CM

## 2017-08-18 DIAGNOSIS — I110 Hypertensive heart disease with heart failure: Secondary | ICD-10-CM | POA: Diagnosis not present

## 2017-08-18 DIAGNOSIS — Z79899 Other long term (current) drug therapy: Secondary | ICD-10-CM | POA: Diagnosis not present

## 2017-08-18 DIAGNOSIS — Z87891 Personal history of nicotine dependence: Secondary | ICD-10-CM | POA: Diagnosis not present

## 2017-08-18 DIAGNOSIS — Z86010 Personal history of colonic polyps: Secondary | ICD-10-CM

## 2017-08-18 DIAGNOSIS — I480 Paroxysmal atrial fibrillation: Secondary | ICD-10-CM | POA: Diagnosis present

## 2017-08-18 DIAGNOSIS — Z950 Presence of cardiac pacemaker: Secondary | ICD-10-CM | POA: Diagnosis not present

## 2017-08-18 DIAGNOSIS — Z7901 Long term (current) use of anticoagulants: Secondary | ICD-10-CM

## 2017-08-18 DIAGNOSIS — R0682 Tachypnea, not elsewhere classified: Secondary | ICD-10-CM | POA: Diagnosis not present

## 2017-08-18 LAB — CBC WITH DIFFERENTIAL/PLATELET
BASOS ABS: 0 10*3/uL (ref 0.0–0.1)
Basophils Relative: 0 %
EOS PCT: 1 %
Eosinophils Absolute: 0.1 10*3/uL (ref 0.0–0.7)
HCT: 41.1 % (ref 39.0–52.0)
Hemoglobin: 13.2 g/dL (ref 13.0–17.0)
LYMPHS PCT: 30 %
Lymphs Abs: 2.7 10*3/uL (ref 0.7–4.0)
MCH: 31.4 pg (ref 26.0–34.0)
MCHC: 32.1 g/dL (ref 30.0–36.0)
MCV: 97.6 fL (ref 78.0–100.0)
MONO ABS: 0.8 10*3/uL (ref 0.1–1.0)
MONOS PCT: 9 %
Neutro Abs: 5.5 10*3/uL (ref 1.7–7.7)
Neutrophils Relative %: 60 %
PLATELETS: 190 10*3/uL (ref 150–400)
RBC: 4.21 MIL/uL — ABNORMAL LOW (ref 4.22–5.81)
RDW: 15.2 % (ref 11.5–15.5)
WBC: 9.1 10*3/uL (ref 4.0–10.5)

## 2017-08-18 LAB — BASIC METABOLIC PANEL
ANION GAP: 10 (ref 5–15)
BUN: 26 mg/dL — AB (ref 6–20)
CALCIUM: 10 mg/dL (ref 8.9–10.3)
CO2: 27 mmol/L (ref 22–32)
CREATININE: 1.51 mg/dL — AB (ref 0.61–1.24)
Chloride: 97 mmol/L — ABNORMAL LOW (ref 101–111)
GFR calc Af Amer: 50 mL/min — ABNORMAL LOW (ref >60)
GFR, EST NON AFRICAN AMERICAN: 43 mL/min — AB (ref >60)
GLUCOSE: 126 mg/dL — AB (ref 65–99)
Potassium: 4.1 mmol/L (ref 3.5–5.1)
Sodium: 134 mmol/L — ABNORMAL LOW (ref 135–145)

## 2017-08-18 LAB — TROPONIN I
Troponin I: <0.03 ng/mL (ref <0.03)
Troponin I: <0.03 ng/mL (ref <0.03)
Troponin I: <0.03 ng/mL (ref <0.03)

## 2017-08-18 LAB — I-STAT TROPONIN, ED: Troponin i, poc: 0 ng/mL (ref 0.00–0.08)

## 2017-08-18 LAB — BRAIN NATRIURETIC PEPTIDE: B Natriuretic Peptide: 699 pg/mL — ABNORMAL HIGH (ref 0.0–100.0)

## 2017-08-18 MED ORDER — DILTIAZEM HCL ER BEADS 300 MG PO CP24
300.0000 mg | ORAL_CAPSULE | Freq: Every day | ORAL | Status: DC
  Administered 2017-08-18 – 2017-08-20 (×3): 300 mg via ORAL
  Filled 2017-08-18 (×3): qty 1

## 2017-08-18 MED ORDER — APIXABAN 5 MG PO TABS
5.0000 mg | ORAL_TABLET | Freq: Two times a day (BID) | ORAL | Status: DC
  Administered 2017-08-18 – 2017-08-20 (×5): 5 mg via ORAL
  Filled 2017-08-18 (×5): qty 1

## 2017-08-18 MED ORDER — ALPRAZOLAM 0.5 MG PO TABS
0.2500 mg | ORAL_TABLET | Freq: Two times a day (BID) | ORAL | Status: DC | PRN
  Administered 2017-08-18: 0.25 mg via ORAL
  Filled 2017-08-18: qty 1

## 2017-08-18 MED ORDER — SODIUM CHLORIDE 0.9% FLUSH
3.0000 mL | Freq: Two times a day (BID) | INTRAVENOUS | Status: DC
  Administered 2017-08-18 – 2017-08-20 (×6): 3 mL via INTRAVENOUS

## 2017-08-18 MED ORDER — PANTOPRAZOLE SODIUM 40 MG PO TBEC
40.0000 mg | DELAYED_RELEASE_TABLET | Freq: Every day | ORAL | Status: DC
  Administered 2017-08-18 – 2017-08-20 (×3): 40 mg via ORAL
  Filled 2017-08-18 (×3): qty 1

## 2017-08-18 MED ORDER — SODIUM CHLORIDE 0.9% FLUSH: 3.0000 mL | INTRAVENOUS | Status: DC | PRN

## 2017-08-18 MED ORDER — FLECAINIDE ACETATE 50 MG PO TABS
150.0000 mg | ORAL_TABLET | Freq: Two times a day (BID) | ORAL | Status: DC
  Filled 2017-08-18 (×5): qty 1

## 2017-08-18 MED ORDER — DILTIAZEM HCL ER COATED BEADS 120 MG PO CP24
120.0000 mg | ORAL_CAPSULE | Freq: Every evening | ORAL | Status: DC
  Administered 2017-08-18 – 2017-08-19 (×2): 120 mg via ORAL
  Filled 2017-08-18 (×4): qty 1

## 2017-08-18 MED ORDER — GABAPENTIN 300 MG PO CAPS
300.0000 mg | ORAL_CAPSULE | Freq: Every day | ORAL | Status: DC
  Administered 2017-08-18 – 2017-08-20 (×3): 300 mg via ORAL
  Filled 2017-08-18 (×3): qty 1

## 2017-08-18 MED ORDER — POLYETHYLENE GLYCOL 3350 17 G PO PACK
17.0000 g | PACK | Freq: Every day | ORAL | Status: DC
  Administered 2017-08-18 – 2017-08-20 (×3): 17 g via ORAL
  Filled 2017-08-18 (×3): qty 1

## 2017-08-18 MED ORDER — FLECAINIDE ACETATE 50 MG PO TABS: 150.0000 mg | ORAL_TABLET | Freq: Two times a day (BID) | ORAL | Status: DC

## 2017-08-18 MED ORDER — ASPIRIN EC 81 MG PO TBEC
81.0000 mg | DELAYED_RELEASE_TABLET | Freq: Every day | ORAL | Status: DC
  Administered 2017-08-18 – 2017-08-20 (×3): 81 mg via ORAL
  Filled 2017-08-18 (×3): qty 1

## 2017-08-18 MED ORDER — ASPIRIN 81 MG PO CHEW
324.0000 mg | CHEWABLE_TABLET | Freq: Once | ORAL | Status: AC
  Administered 2017-08-18: 324 mg via ORAL
  Filled 2017-08-18: qty 4

## 2017-08-18 MED ORDER — METOPROLOL TARTRATE 25 MG PO TABS
12.5000 mg | ORAL_TABLET | Freq: Two times a day (BID) | ORAL | Status: DC
Start: 2017-08-18 — End: 2017-08-20
  Administered 2017-08-18 – 2017-08-20 (×6): 12.5 mg via ORAL
  Filled 2017-08-18 (×6): qty 1

## 2017-08-18 MED ORDER — SODIUM CHLORIDE 0.9 % IV SOLN: 250.0000 mL | INTRAVENOUS | Status: DC | PRN

## 2017-08-18 MED ORDER — FLECAINIDE ACETATE 50 MG PO TABS
150.0000 mg | ORAL_TABLET | Freq: Two times a day (BID) | ORAL | Status: DC
  Administered 2017-08-18 – 2017-08-20 (×5): 150 mg via ORAL
  Filled 2017-08-18 (×6): qty 3

## 2017-08-18 MED ORDER — ONDANSETRON HCL 4 MG/2ML IJ SOLN: 4.0000 mg | Freq: Four times a day (QID) | INTRAMUSCULAR | Status: DC | PRN

## 2017-08-18 MED ORDER — GABAPENTIN 300 MG PO CAPS
600.0000 mg | ORAL_CAPSULE | Freq: Every day | ORAL | Status: DC
  Administered 2017-08-18 – 2017-08-19 (×2): 600 mg via ORAL
  Filled 2017-08-18 (×2): qty 2

## 2017-08-18 MED ORDER — TAMSULOSIN HCL 0.4 MG PO CAPS
0.4000 mg | ORAL_CAPSULE | Freq: Every day | ORAL | Status: DC
  Administered 2017-08-18 – 2017-08-20 (×3): 0.4 mg via ORAL
  Filled 2017-08-18 (×4): qty 1

## 2017-08-18 MED ORDER — MORPHINE SULFATE ER 15 MG PO TBCR
15.0000 mg | EXTENDED_RELEASE_TABLET | Freq: Two times a day (BID) | ORAL | Status: DC
  Administered 2017-08-18 – 2017-08-20 (×5): 15 mg via ORAL
  Filled 2017-08-18 (×5): qty 1

## 2017-08-18 MED ORDER — ACETAMINOPHEN 325 MG PO TABS
650.0000 mg | ORAL_TABLET | ORAL | Status: DC | PRN
  Administered 2017-08-18: 650 mg via ORAL
  Filled 2017-08-18: qty 2

## 2017-08-18 MED ORDER — FUROSEMIDE 10 MG/ML IJ SOLN
60.0000 mg | Freq: Once | INTRAMUSCULAR | Status: AC
  Administered 2017-08-18: 60 mg via INTRAVENOUS
  Filled 2017-08-18: qty 6

## 2017-08-18 MED ORDER — FUROSEMIDE 10 MG/ML IJ SOLN
40.0000 mg | Freq: Two times a day (BID) | INTRAMUSCULAR | Status: DC
  Administered 2017-08-18 – 2017-08-19 (×3): 40 mg via INTRAVENOUS
  Filled 2017-08-18 (×3): qty 4

## 2017-08-18 NOTE — ED Provider Notes (Signed)
[facility] EMERGENCY DEPARTMENT Provider Note   CSN: [id number] Arrival date & time: 08/18/17  0103     History   Chief Complaint Chief Complaint  Patient presents with  . Shortness of Breath    HPI [name]. is a 76 y.o. male.  Patient presents via EMS with a 4-day history of shortness of breath.  Recent admission for CHF exacerbation with change of his Lasix dose.  States compliance.  Denies chest pain, cough or fever.  Hypoxic in the 80s per EMS.  Improved with oxygen supplementation.  Normally is able to lay flat without difficulty.  Patient with history of tachybradycardia syndrome, Saint Jude pacemaker, atrial fibrillation on Eliquis and diastolic heart failure.   The history is provided by the patient and the EMS personnel. The history is limited by the condition of the patient.  Shortness of Breath     Past Medical History:  Diagnosis Date  . Atrial flutter ([place])    a. s/p ablation 01/2015.  Marland Kitchen BPH (benign prostatic hyperplasia)   . Cerebrovascular disease    carotid bruit; no focal stenosis by carotid ultrasound in 05/2008  . Congestive heart failure (CHF) ([place])   . Dysrhythmia   . General weakness    +malaise, exercise intolerance, and near syncope  . Hypertension   . Low back pain    history of traumax2  . Paroxysmal atrial fibrillation (HCC)    on Eliquis  . Presence of permanent cardiac pacemaker 2009  . Sleep apnea    c pap at night  . Tachy-brady syndrome ([place])    a. s/p St. Jude pacemaker 2009.    Patient Active Problem List   Diagnosis Date Noted  . Acute on chronic diastolic CHF (congestive heart failure) ([place]) 08/08/2017  . Hyperglycemia 08/08/2017  . Acute kidney injury superimposed on chronic kidney disease ([place]) 08/08/2017  . Lumbar radiculopathy 07/24/2017  . Pars defect of lumbar spine 07/24/2017  . Lumbar degenerative disc disease 07/24/2017  . Chronic diastolic CHF (congestive heart failure) ([place]) 06/06/2017  . Hx of  colonic polyps 12/28/2016  . Paroxysmal atrial fibrillation (HCC)   . Fatigue 11/18/2015  . Chronic anticoagulation 11/18/2015  . Atrial fibrillation ([place]) 03/15/2015  . Dyspnea 12/25/2014  . OSA (obstructive sleep apnea) 09/02/2013  . Atrial flutter ([place]) 11/28/2012  . Hypertension 11/21/2011  . Sick sinus syndrome ([place]) 06/13/2011  . Hyperlipidemia 02/17/2010  . ORTHOSTATIC DIZZINESS 02/17/2010  . Chest pain 02/17/2010  . PPM-St.Jude 06/16/2009  . LOW BACK PAIN, CHRONIC 07/31/2008  . Cerebrovascular disease 07/31/2008  . BENIGN PROSTATIC HYPERTROPHY, MILD, HX OF 07/31/2008    Past Surgical History:  Procedure Laterality Date  . ABLATION    . CATARACT EXTRACTION     Bilateral  . CERVICAL DISCECTOMY  2006  . COLONOSCOPY WITH PROPOFOL N/A 01/26/2017   Procedure: COLONOSCOPY WITH PROPOFOL;  Surgeon: [name], MD;  Location: [facility];  Service: Endoscopy;  Laterality: N/A;  8:30  . ELECTROPHYSIOLOGIC STUDY N/A 01/18/2015   Procedure: A-Flutter/A-Tach/Svt Ablation;  Surgeon: [name], MD;  Location: [place] INVASIVE CV LAB CUPID;  Service: Cardiovascular;  Laterality: N/A;  . KNEE ARTHROSCOPY WITH MEDIAL MENISECTOMY Left 06/03/2015   Procedure: KNEE ARTHROSCOPY WITH MEDIAL MENISECTOMY;  Surgeon: [name], MD;  Location: [facility] ORS;  Service: Orthopedics;  Laterality: Left;  . PACEMAKER INSERTION  1966  . POLYPECTOMY  01/26/2017   Procedure: POLYPECTOMY;  Surgeon: [name], MD;  Location: [facility];  Service:  Endoscopy;;  colon       Home Medications    Prior to Admission medications   Medication Sig Start Date End Date Taking? Authorizing Provider  acetaminophen (TYLENOL) 500 MG tablet Take 1,300 mg by mouth every 6 (six) hours as needed for mild pain.     [provider]  apixaban (ELIQUIS) 5 MG TABS tablet Take 1 tablet (5 mg total) by mouth 2 (two) times daily. 08/09/16   Erlene Quan, PA-C  aspirin EC 81 MG EC tablet Take 1 tablet (81 mg  total) by mouth daily. 08/12/17   Isaac Bliss, Rayford Halsted, MD  CALCIUM-MAGNESIUM-VITAMIN D PO Take 1 tablet by mouth at bedtime.     [provider]  diltiazem (TIAZAC) 120 MG 24 hr capsule Take 120 mg by mouth every evening.     [provider]  diltiazem (TIAZAC) 300 MG 24 hr capsule Take 300 mg by mouth daily.     [provider]  flecainide (TAMBOCOR) 100 MG tablet Take 1.5 tablets (150 mg total) by mouth 2 (two) times daily. 05/26/16   [name], MD  furosemide (LASIX) 20 MG tablet Take 3 tablets (60 mg total) by mouth daily. 08/11/17   Isaac Bliss, Rayford Halsted, MD  gabapentin (NEURONTIN) 300 MG capsule Take 300-600 mg by mouth 2 (two) times daily. 300 mg in the morning and 600 mg in the evening    [provider]  hydrocortisone (ANUSOL-HC) 2.5 % rectal cream Place 1 application rectally as needed for hemorrhoids or itching.     [provider]  ketoconazole (NIZORAL) 2 % cream Apply 1 application topically daily.    [provider]  lidocaine (LIDODERM) 5 % Place 1 patch onto the skin daily. Remove & Discard patch within 12 hours or as directed by MD    [provider]  metoprolol tartrate (LOPRESSOR) 25 MG tablet Take 0.5 tablets (12.5 mg total) by mouth 2 (two) times daily. 01/01/15   [name], MD  morphine (MS CONTIN) 15 MG 12 hr tablet Take 15 mg by mouth every 12 (twelve) hours.    [provider]  pantoprazole (PROTONIX) 40 MG tablet Take 40 mg by mouth daily.    [provider]  polyethylene glycol (MIRALAX / GLYCOLAX) packet Take 17 g by mouth daily.    [provider]  psyllium (METAMUCIL) 58.6 % powder Take 1 packet by mouth daily.     [provider]  Tamsulosin HCl (FLOMAX) 0.4 MG CAPS Take 0.4 mg by mouth daily.     [provider]  zinc oxide (BALMEX) 11.3 % CREA cream Apply 1 application topically 2 (two) times daily.    [provider]    Family  History Family History  Problem Relation Age of Onset  . Hypertension Unknown   . Neuropathy Neg Hx   . Stroke Neg Hx     Social History Social History   Tobacco Use  . Smoking status: Former Smoker    Packs/day: 1.00    Years: 0.50    Pack years: 0.50    Types: Cigarettes    Last attempt to quit: 03/27/1962    Years since quitting: 55.4  . Smokeless tobacco: Never Used  Substance Use Topics  . Alcohol use: No    Alcohol/week: 0.0 oz  . Drug use: No     Allergies   Bee venom; Shellfish allergy; and Penicillins   Review of Systems Review of Systems  Respiratory:  Positive for shortness of breath.      Physical Exam Updated Vital Signs BP 118/84 (BP Location: Right Arm)   Pulse (!) 117   Temp 97.9 F (36.6 C) (Oral)   Resp (!) 37   Ht 5\' 8"  (1.727 m)   Wt 107.5 kg (237 lb)   SpO2 92%   BMI 36.04 kg/m   Physical Exam  Constitutional: He is oriented to person, place, and time. He appears well-developed and well-nourished. He appears distressed.  Dyspneic with conversation  HENT:  Head: Normocephalic and atraumatic.  Right Ear: External ear normal.  Left Ear: External ear normal.  Mouth/Throat: Oropharynx is clear and moist. No oropharyngeal exudate.  Eyes: Conjunctivae and EOM are normal. Pupils are equal, round, and reactive to light.  Neck: Normal range of motion. Neck supple.  No meningismus.  Cardiovascular: Normal rate, regular rhythm, normal heart sounds and intact distal pulses.  No murmur heard. Wide complex tachycardia  Pulmonary/Chest: He is in respiratory distress. He has rales.  Abdominal: Soft. He exhibits no mass. There is no tenderness. There is no rebound and no guarding.  Musculoskeletal: Normal range of motion. He exhibits edema. He exhibits no tenderness.  Neurological: He is alert and oriented to person, place, and time. No cranial nerve deficit. He exhibits normal muscle tone. Coordination normal.  No ataxia on finger to nose  bilaterally. No pronator drift. 5/5 strength throughout. CN 2-12 intact.Equal grip strength. Sensation intact.   Skin: Skin is warm.  Psychiatric: He has a normal mood and affect. His behavior is normal.  Nursing note and vitals reviewed.    ED Treatments / Results  Labs (all labs ordered are listed, but only abnormal results are displayed) Labs Reviewed  CBC WITH DIFFERENTIAL/PLATELET - Abnormal; Notable for the following components:      Result Value   RBC 4.21 (*)    All other components within normal limits  BASIC METABOLIC PANEL - Abnormal; Notable for the following components:   Sodium 134 (*)    Chloride 97 (*)    Glucose, Bld 126 (*)    BUN 26 (*)    Creatinine, Ser 1.51 (*)    GFR calc non Af Amer 43 (*)    GFR calc Af Amer 50 (*)    All other components within normal limits  BRAIN NATRIURETIC PEPTIDE - Abnormal; Notable for the following components:   B Natriuretic Peptide 699.0 (*)    All other components within normal limits  TROPONIN I  TROPONIN I  TROPONIN I  I-STAT TROPONIN, ED    EKG  EKG Interpretation  Date/Time:  Saturday August 18 2017 01:22:30 EST Ventricular Rate:  117 PR Interval:    QRS Duration: 262 QT Interval:  440 QTC Calculation: 614 R Axis:   -80 Text Interpretation:  A-V dual-paced rhythm with some inhibition No further analysis attempted due to paced rhythm No significant change was found Confirmed by Ezequiel Essex [phone]) on 08/18/2017 4:13:37 AM       Radiology Dg Chest Portable 1 View  Result Date: 08/18/2017 CLINICAL DATA:  Shortness of breath for 3 days. EXAM: PORTABLE CHEST 1 VIEW COMPARISON:  08/08/2017 FINDINGS: Left-sided pacemaker remains in place. Cardiomegaly with increased from prior. Development of pulmonary edema from prior. Possible small pleural effusions. No confluent airspace disease. No pneumothorax. IMPRESSION: CHF. Electronically Signed   By: Jeb Levering M.D.   On: 08/18/2017 02:35     Procedures Procedures (including critical care time)  Medications Ordered in ED  Medications  furosemide (LASIX) injection 60 mg (60 mg Intravenous Given 08/18/17 0124)     Initial Impression / Assessment and Plan / ED Course  I have reviewed the triage vital signs and the nursing notes.  Pertinent labs & imaging results that were available during my care of the patient were reviewed by me and considered in my medical decision making (see chart for details).      Patient presents with a 4-day history of shortness of breath and nonproductive cough.  Denies chest pain.  Hypoxic for EMS.  Recent hospital admission for CHF with transition of his Lasix dose from 40 mg a day to 60 mg a day.  EKG discussed with Dr. Mliss Sax of cardiology.  He feels this is ventricular paced rhythm with wide complex and not ventricular tachycardia.  This is similar to previous EKG from November 21. Interrogation of the device at that time showed sinus tachycardia with intermittent episodes of atrial fibrillation.  Patient given IV Lasix on arrival.  He is distant with conversation but stable nasal cannula.  Labs are reassuring with stable creatinine.  Negative troponin.  Chest x-ray confirms pulmonary edema.  The patient's pacer device will be interrogated.  Representative is on the way.  Admission for pulmonary edema and CHF exacerbation discussed with Dr. Myna Hidalgo.  CRITICAL CARE Performed by: Ezequiel Essex Total critical care time: 32 minutes Critical care time was exclusive of separately billable procedures and treating other patients. Critical care was necessary to treat or prevent imminent or life-threatening deterioration. Critical care was time spent personally by me on the following activities: development of treatment plan with patient and/or surrogate as well as nursing, discussions with consultants, evaluation of patient's response to treatment, examination of patient, obtaining history from  patient or surrogate, ordering and performing treatments and interventions, ordering and review of laboratory studies, ordering and review of radiographic studies, pulse oximetry and re-evaluation of patient's condition.  Final Clinical Impressions(s) / ED Diagnoses   Final diagnoses:  Acute on chronic diastolic congestive heart failure [facility])    ED Discharge Orders    None       Ezequiel Essex, MD 08/18/17 [phone]

## 2017-08-18 NOTE — ED Triage Notes (Signed)
PT arrive via [facility] from home c/o SOB.

## 2017-08-18 NOTE — Progress Notes (Signed)
Patient admitted to the hospital earlier this morning by Dr. Myna Hidalgo.  Patient seen and examined.  Feels that his shortness of breath is improving.  He does not wear oxygen at home.  Exam indicates crackles at bases.  Heart rate is irregular.  He does not have any significant lower extremity edema.  The patient was admitted to the hospital with decompensated CHF.  He was started on IV Lasix.  Intake and output have not been accurately recorded.  Continue on IV Lasix for now.  Follow-up labs in the morning.  Follow volume status closely.  For atrial fibrillation, will be continued on diltiazem and flecainide.  He is anticoagulated with Eliquis.  [employer]

## 2017-08-18 NOTE — Progress Notes (Signed)
Set up CPAP in patients room. Once I got it set up he said he would rather sleep without it tonight. Explained importance of wearing a CPAP. Told patient to have RN call me if he changed his mind. RT will continue to monitor.

## 2017-08-18 NOTE — H&P (Signed)
History and Physical    [name]. PRF:[id number] DOB: 09-07-1941 DOA: 08/18/2017  PCP: [name], MD   Patient coming from: Home  Chief Complaint: SOB  HPI: [name]. is a 76 y.o. male with medical history significant for paroxysmal atrial fibrillation on Eliquis, chronic diastolic CHF, hypertension, chronic back pain, and tachybradycardia syndrome status post pacer placement, now presenting to the emergency department for evaluation of dyspnea.  Patient was discharged from the hospital 1 week ago after an admission for acute on chronic diastolic CHF, reports that he was doing well back at home initially, but noted a recurrence in dyspnea approximately 4 days ago.  Symptoms have continued to worsen since that time and include orthopnea.  Patient denies any fevers or chills and denies any significant cough.  There has not been any chest pain associated with this.  He reports continued adherence with his medications and denies any dietary indiscretion. Reports that weight has been stable at home.   ED Course: Upon arrival to the ED, patient is found to be afebrile, requiring 3 L of supplemental oxygen in order to maintain saturations in the low 90s,  tachypneic in the 30s, and tachycardic in the 110s.  EKG features in AV paced rhythm with rate 117.  Chest x-ray reveals increased cardiomegaly and new pulmonary edema.  Chemistry panel is notable for a sodium of 134, BUN 26, and creatinine 1.51, slightly up from his apparent baseline.  CBC is unremarkable.  Troponin is undetectable and BNP is elevated to 699.  Patient was treated with 324 mg of aspirin and 60 mg IV Lasix in the ED.  Cardiology reviewed the patient's EKG.  Patient remained dyspneic and slightly tachycardic in the ED, but with stable blood pressure and no acute distress.  He will be admitted to the telemetry unit for ongoing evaluation and management of acute on chronic diastolic CHF with acute hypoxic respiratory  failure.  Review of Systems:  All other systems reviewed and apart from HPI, are negative.  Past Medical History:  Diagnosis Date  . Atrial flutter ([place])    a. s/p ablation 01/2015.  Marland Kitchen BPH (benign prostatic hyperplasia)   . Cerebrovascular disease    carotid bruit; no focal stenosis by carotid ultrasound in 05/2008  . Congestive heart failure (CHF) ([place])   . Dysrhythmia   . General weakness    +malaise, exercise intolerance, and near syncope  . Hypertension   . Low back pain    history of traumax2  . Paroxysmal atrial fibrillation (HCC)    on Eliquis  . Presence of permanent cardiac pacemaker 2009  . Sleep apnea    c pap at night  . Tachy-brady syndrome ([place])    a. s/p [employer] pacemaker 2009.    Past Surgical History:  Procedure Laterality Date  . ABLATION    . CATARACT EXTRACTION     Bilateral  . CERVICAL DISCECTOMY  2006  . COLONOSCOPY WITH PROPOFOL N/A 01/26/2017   Procedure: COLONOSCOPY WITH PROPOFOL;  Surgeon: [name], MD;  Location: [facility];  Service: Endoscopy;  Laterality: N/A;  8:30  . ELECTROPHYSIOLOGIC STUDY N/A 01/18/2015   Procedure: A-Flutter/A-Tach/Svt Ablation;  Surgeon: [name], MD;  Location: [place];  Service: Cardiovascular;  Laterality: N/A;  . KNEE ARTHROSCOPY WITH MEDIAL MENISECTOMY Left 06/03/2015   Procedure: KNEE ARTHROSCOPY WITH MEDIAL MENISECTOMY;  Surgeon: [name], MD;  Location: [facility];  Service: Orthopedics;  Laterality: Left;  .  PACEMAKER INSERTION  1966  . POLYPECTOMY  01/26/2017   Procedure: POLYPECTOMY;  Surgeon: [name], MD;  Location: [facility];  Service: Endoscopy;;  colon     reports that he quit smoking about 55 years ago. His smoking use included cigarettes. He has a 0.50 pack-year smoking history. he has never used smokeless tobacco. He reports that he does not drink alcohol or use drugs.  Allergies  Allergen Reactions  . Bee Venom Anaphylaxis and Swelling  . Shellfish  Allergy Hives    Accelerated allergic reaction; questionable laryngospasm or laryngeal edema with some difficulty breathing prompting ED evaluation and [facility] at [facility]  . Penicillins Rash    Has patient had a PCN reaction causing immediate rash, facial/tongue/throat swelling, SOB or lightheadedness with hypotension: No Has patient had a PCN reaction causing severe rash involving mucus membranes or skin necrosis: No Has patient had a PCN reaction that required hospitalization No Has patient had a PCN reaction occurring within the last 10 years: No If all of the above answers are "NO", then may proceed with Cephalosporin use.     Family History  Problem Relation Age of Onset  . Hypertension Unknown   . Neuropathy Neg Hx   . Stroke Neg Hx      Prior to Admission medications   Medication Sig Start Date End Date Taking? Authorizing Provider  acetaminophen (TYLENOL) 500 MG tablet Take 1,300 mg by mouth every 6 (six) hours as needed for mild pain.     [provider]  apixaban (ELIQUIS) 5 MG TABS tablet Take 1 tablet (5 mg total) by mouth 2 (two) times daily. 08/09/16   Erlene Quan, PA-C  aspirin EC 81 MG EC tablet Take 1 tablet (81 mg total) by mouth daily. 08/12/17   Isaac Bliss, Rayford Halsted, MD  CALCIUM-MAGNESIUM-VITAMIN D PO Take 1 tablet by mouth at bedtime.     [provider]  diltiazem (TIAZAC) 120 MG 24 hr capsule Take 120 mg by mouth every evening.     [provider]  diltiazem (TIAZAC) 300 MG 24 hr capsule Take 300 mg by mouth daily.     [provider]  flecainide (TAMBOCOR) 100 MG tablet Take 1.5 tablets (150 mg total) by mouth 2 (two) times daily. 05/26/16   [name], MD  furosemide (LASIX) 20 MG tablet Take 3 tablets (60 mg total) by mouth daily. 08/11/17   Isaac Bliss, Rayford Halsted, MD  gabapentin (NEURONTIN) 300 MG capsule Take 300-600 mg by mouth 2 (two) times daily. 300 mg in the morning and 600 mg in the evening     [provider]  hydrocortisone (ANUSOL-HC) 2.5 % rectal cream Place 1 application rectally as needed for hemorrhoids or itching.     [provider]  ketoconazole (NIZORAL) 2 % cream Apply 1 application topically daily.    [provider]  lidocaine (LIDODERM) 5 % Place 1 patch onto the skin daily. Remove & Discard patch within 12 hours or as directed by MD    [provider]  metoprolol tartrate (LOPRESSOR) 25 MG tablet Take 0.5 tablets (12.5 mg total) by mouth 2 (two) times daily. 01/01/15   [name], MD  morphine (MS CONTIN) 15 MG 12 hr tablet Take 15 mg by mouth every 12 (twelve) hours.    [provider]  pantoprazole (PROTONIX) 40 MG tablet Take 40 mg by mouth daily.    [provider]  polyethylene glycol (MIRALAX / GLYCOLAX)  packet Take 17 g by mouth daily.    [provider]  psyllium (METAMUCIL) 58.6 % powder Take 1 packet by mouth daily.     [provider]  Tamsulosin HCl (FLOMAX) 0.4 MG CAPS Take 0.4 mg by mouth daily.     [provider]  zinc oxide (BALMEX) 11.3 % CREA cream Apply 1 application topically 2 (two) times daily.    [provider]    Physical Exam: Vitals:   08/18/17 0109 08/18/17 0110 08/18/17 0112 08/18/17 0114  BP:  118/84 118/84   Pulse:  (!) 117 (!) 117   Resp:  (!) 37 (!) 31   Temp:  97.9 F (36.6 C)    TempSrc:  Oral    SpO2: 92% 92% 92%   Weight:    107.5 kg (237 lb)  Height:    5\' 8"  (1.727 m)      Constitutional: NAD, calm, mild tachypnea Eyes: PERTLA, lids and conjunctivae normal ENMT: Mucous membranes are moist. Posterior pharynx clear of any exudate or lesions.   Neck: normal, supple, no masses, no thyromegaly Respiratory: Rales bilaterally. Tachypnea, dyspnea with speech. No accessory muscle use.  Cardiovascular: Rate ~110 and regular. 1+ pretibial edema bilaterally. JVP not well-visualized.  Abdomen: No distension, no tenderness, no masses  palpated. Bowel sounds normal.  Musculoskeletal: no clubbing / cyanosis. No joint deformity upper and lower extremities.    Skin: no significant rashes, lesions, ulcers. Warm, dry, well-perfused. Neurologic: CN 2-12 grossly intact. Sensation intact. Strength 5/5 in all 4 limbs.  Psychiatric: Alert and oriented x 3. Pleasant, cooperative.     Labs on Admission: I have personally reviewed following labs and imaging studies  CBC: Recent Labs  Lab 08/18/17 0121  WBC 9.1  NEUTROABS 5.5  HGB 13.2  HCT 41.1  MCV 97.6  PLT 831   Basic Metabolic Panel: Recent Labs  Lab 08/11/17 0430 08/18/17 0121  NA 137 134*  K 3.1* 4.1  CL 97* 97*  CO2 30 27  GLUCOSE 89 126*  BUN 26* 26*  CREATININE 1.46* 1.51*  CALCIUM 8.9 10.0   GFR: Estimated Creatinine Clearance: 49.4 mL/min (A) (by C-G formula based on SCr of 1.51 mg/dL (H)). Liver Function Tests: No results for input(s): AST, ALT, ALKPHOS, BILITOT, PROT, ALBUMIN in the last 168 hours. No results for input(s): LIPASE, AMYLASE in the last 168 hours. No results for input(s): AMMONIA in the last 168 hours. Coagulation Profile: No results for input(s): INR, PROTIME in the last 168 hours. Cardiac Enzymes: Recent Labs  Lab 08/18/17 0121  TROPONINI <0.03   BNP (last 3 results) No results for input(s): PROBNP in the last 8760 hours. HbA1C: No results for input(s): HGBA1C in the last 72 hours. CBG: Recent Labs  Lab 08/11/17 0734 08/11/17 1114  GLUCAP 91 116*   Lipid Profile: No results for input(s): CHOL, HDL, LDLCALC, TRIG, CHOLHDL, LDLDIRECT in the last 72 hours. Thyroid Function Tests: No results for input(s): TSH, T4TOTAL, FREET4, T3FREE, THYROIDAB in the last 72 hours. Anemia Panel: No results for input(s): VITAMINB12, FOLATE, FERRITIN, TIBC, IRON, RETICCTPCT in the last 72 hours. Urine analysis:    Component Value Date/Time   COLORURINE YELLOW 08/08/2017 [place] 08/08/2017 1307   LABSPEC 1.019  08/08/2017 1307   PHURINE 5.0 08/08/2017 1307   GLUCOSEU NEGATIVE 08/08/2017 1307   HGBUR NEGATIVE 08/08/2017 [place] 08/08/2017 [place] 08/08/2017 [place] 08/08/2017 1307  UROBILINOGEN 0.2 05/28/2015 1100   NITRITE NEGATIVE 08/08/2017 1307   LEUKOCYTESUR NEGATIVE 08/08/2017 1307   Sepsis Labs: @LABRCNTIP (procalcitonin:4,lacticidven:4) )No results found for this or any previous visit (from the past 240 hour(s)).   Radiological Exams on Admission: Dg Chest Portable 1 View  Result Date: 08/18/2017 CLINICAL DATA:  Shortness of breath for 3 days. EXAM: PORTABLE CHEST 1 VIEW COMPARISON:  08/08/2017 FINDINGS: Left-sided pacemaker remains in place. Cardiomegaly with increased from prior. Development of pulmonary edema from prior. Possible small pleural effusions. No confluent airspace disease. No pneumothorax. IMPRESSION: CHF. Electronically Signed   By: Jeb Levering M.D.   On: 08/18/2017 02:35    EKG: Independently reviewed. A-V paced rhythm, rate 117.   Assessment/Plan  1. Acute on chronic diastolic CHF; acute hypoxic respiratory failure  - Pt presents with dyspnea, found to be hypoxic with pulmonary edema and increased cardiomegaly on CXR  - Pt reports continued adherence with his medications and denies dietary indiscretions; no chest pain to suggest an ischemic etiology; rapid HR may be contributing, EKG reviewed by cardiologist, pacer will be interrogated  - He was treated with 60 mg IV Lasix in ED  - Plan to continue cardiac monitoring, have pacer interrogated, SLIV, fluid-restrict diet, follow daily wts and I/O's, continue diuresis with Lasix 40 mg IV q12h, continue beta-blocker, follow daily chem panel during diuresis - Echo just performed in October, will hold off on repeating for now    2. Atrial fibrillation  - Pt in a paced rhythm on admission  - CHADS-VASc is 46 (age x2, CHF, CAD)  - Continue Eliquis, diltiazem, and  flecainide    3. CKD stage III  - SCr is 1.51 on admission, slightly up from his apparent baseline  - Plan to renally-dose medications, follow daily chem panel during diuresis    4. Chronic back pain  - Stable, continue home regimen with long-acting morphine     DVT prophylaxis: Eliquis  Code Status: Full  Family Communication: Discussed with patient Disposition Plan: Admit to telemetry Consults called: Cardiology reviewed admission EKG  Admission status: Inpatient    Vianne Bulls, MD [facility] Pager [phone]  If 7PM-7AM, please contact night-coverage [email or web address] Password [email or web address]  08/18/2017, 3:16 AM

## 2017-08-19 DIAGNOSIS — J9601 Acute respiratory failure with hypoxia: Secondary | ICD-10-CM

## 2017-08-19 DIAGNOSIS — N183 Chronic kidney disease, stage 3 (moderate): Secondary | ICD-10-CM

## 2017-08-19 DIAGNOSIS — I480 Paroxysmal atrial fibrillation: Secondary | ICD-10-CM

## 2017-08-19 DIAGNOSIS — G8929 Other chronic pain: Secondary | ICD-10-CM

## 2017-08-19 LAB — CBC
HEMATOCRIT: 38 % — AB (ref 39.0–52.0)
HEMOGLOBIN: 11.8 g/dL — AB (ref 13.0–17.0)
MCH: 30.6 pg (ref 26.0–34.0)
MCHC: 31.1 g/dL (ref 30.0–36.0)
MCV: 98.7 fL (ref 78.0–100.0)
Platelets: 182 10*3/uL (ref 150–400)
RBC: 3.85 MIL/uL — ABNORMAL LOW (ref 4.22–5.81)
RDW: 15.2 % (ref 11.5–15.5)
WBC: 13.6 10*3/uL — ABNORMAL HIGH (ref 4.0–10.5)

## 2017-08-19 LAB — BASIC METABOLIC PANEL
ANION GAP: 8 (ref 5–15)
BUN: 28 mg/dL — ABNORMAL HIGH (ref 6–20)
CALCIUM: 8.9 mg/dL (ref 8.9–10.3)
CO2: 29 mmol/L (ref 22–32)
Chloride: 99 mmol/L — ABNORMAL LOW (ref 101–111)
Creatinine, Ser: 1.26 mg/dL — ABNORMAL HIGH (ref 0.61–1.24)
GFR, EST NON AFRICAN AMERICAN: 54 mL/min — AB (ref >60)
Glucose, Bld: 127 mg/dL — ABNORMAL HIGH (ref 65–99)
Potassium: 4.1 mmol/L (ref 3.5–5.1)
SODIUM: 136 mmol/L (ref 135–145)

## 2017-08-19 MED ORDER — MAGNESIUM HYDROXIDE 400 MG/5ML PO SUSP
30.0000 mL | Freq: Every day | ORAL | Status: DC | PRN
  Administered 2017-08-19: 30 mL via ORAL
  Filled 2017-08-19: qty 30

## 2017-08-19 MED ORDER — FUROSEMIDE 10 MG/ML IJ SOLN: 60.0000 mg | Freq: Two times a day (BID) | INTRAMUSCULAR | Status: DC

## 2017-08-19 MED ORDER — FUROSEMIDE 10 MG/ML IJ SOLN
60.0000 mg | Freq: Two times a day (BID) | INTRAMUSCULAR | Status: DC
Start: 2017-08-19 — End: 2017-08-20
  Administered 2017-08-19 – 2017-08-20 (×2): 60 mg via INTRAVENOUS
  Filled 2017-08-19 (×2): qty 6

## 2017-08-19 MED ORDER — SALINE SPRAY 0.65 % NA SOLN: 1.0000 | NASAL | Status: DC | PRN

## 2017-08-19 MED ORDER — BENZOCAINE 10 % MT GEL
Freq: Four times a day (QID) | OROMUCOSAL | Status: DC | PRN
  Administered 2017-08-19: 11:00:00 via OROMUCOSAL
  Filled 2017-08-19: qty 9

## 2017-08-19 NOTE — Progress Notes (Signed)
PROGRESS NOTE    [name].  QMG:[id number] DOB: 29-Sep-1940 DOA: 08/18/2017 PCP: [name], MD    Brief Narrative:  76 year old male with a history of diastolic heart failure and atrial fibrillation, presented to the hospital with complaints of shortness of breath.  Found to have decompensated heart failure and admitted to the hospital for intravenous diuresis.   Assessment & Plan:   Principal Problem:   Acute on chronic diastolic CHF (congestive heart failure) (HCC) Active Problems:   OSA (obstructive sleep apnea)   Paroxysmal atrial fibrillation (HCC)   CKD (chronic kidney disease), stage III (HCC)   Acute respiratory failure with hypoxia (HCC)   Chronic pain   1. Acute on chronic diastolic CHF.  Started on intravenous Lasix.  Overall volume status appears to be improving.  Intake and output may not have been accurately recorded.  Symptomatically and clinically the patient is improving.  We will continue with intravenous Lasix for now. 2. Acute respiratory failure with hypoxia.  Related to decompensated CHF.  Wean off oxygen as tolerated. 3. Atrial fibrillation.  Patient is in a paced rhythm.  Continue on diltiazem and flecainide.  Anticoagulated with Eliquis. 4. Chronic kidney disease stage III.  Creatinine 1.5 on admission, improving after receiving Lasix.  Continue to monitor while diuresing. 5. Chronic back pain.  Continue on home regimen of long-acting morphine.   DVT prophylaxis: Eliquis Code Status: Full code Family Communication: No family present Disposition Plan: Discharge home once improved   Consultants:     Procedures:     Antimicrobials:       Subjective: Feels her breathing is improving.  No complaints of chest pain.  Objective: Vitals:   08/18/17 1116 08/18/17 1435 08/18/17 2137 08/19/17 0352  BP: 125/66 (!) 104/55 114/76 108/65  Pulse: 72 69 76 74  Resp:  18 18 19   Temp:  98.4 F (36.9 C) 98 F (36.7 C) 98.7 F (37.1 C)    TempSrc:  Oral Oral Oral  SpO2:  95% 95% 94%  Weight:    108.5 kg (239 lb 3.2 oz)  Height:        Intake/Output Summary (Last 24 hours) at 08/19/2017 1332 Last data filed at 08/19/2017 1100 Gross per 24 hour  Intake 480 ml  Output 2300 ml  Net -1820 ml   Filed Weights   08/18/17 0114 08/18/17 0400 08/19/17 0352  Weight: 107.5 kg (237 lb) 115.5 kg (254 lb 11.2 oz) 108.5 kg (239 lb 3.2 oz)    Examination:  General exam: Appears calm and comfortable  Respiratory system: Crackles at bases. Respiratory effort normal. Cardiovascular system: S1 & S2 heard, RRR. No JVD, murmurs, rubs, gallops or clicks.  Trace pedal edema. Gastrointestinal system: Abdomen is nondistended, soft and nontender. No organomegaly or masses felt. Normal bowel sounds heard. Central nervous system: Alert and oriented. No focal neurological deficits. Extremities: Symmetric 5 x 5 power. Skin: No rashes, lesions or ulcers Psychiatry: Judgement and insight appear normal. Mood & affect appropriate.     Data Reviewed: I have personally reviewed following labs and imaging studies  CBC: Recent Labs  Lab 08/18/17 0121 08/19/17 0514  WBC 9.1 13.6*  NEUTROABS 5.5  --   HGB 13.2 11.8*  HCT 41.1 38.0*  MCV 97.6 98.7  PLT 190 326   Basic Metabolic Panel: Recent Labs  Lab 08/18/17 0121 08/19/17 0514  NA 134* 136  K 4.1 4.1  CL 97* 99*  CO2 27 29  GLUCOSE 126* 127*  BUN  26* 28*  CREATININE 1.51* 1.26*  CALCIUM 10.0 8.9   GFR: Estimated Creatinine Clearance: 59.5 mL/min (A) (by C-G formula based on SCr of 1.26 mg/dL (H)). Liver Function Tests: No results for input(s): AST, ALT, ALKPHOS, BILITOT, PROT, ALBUMIN in the last 168 hours. No results for input(s): LIPASE, AMYLASE in the last 168 hours. No results for input(s): AMMONIA in the last 168 hours. Coagulation Profile: No results for input(s): INR, PROTIME in the last 168 hours. Cardiac Enzymes: Recent Labs  Lab 08/18/17 0121 08/18/17 0646  08/18/17 1245  TROPONINI <0.03 <0.03 <0.03   BNP (last 3 results) No results for input(s): PROBNP in the last 8760 hours. HbA1C: No results for input(s): HGBA1C in the last 72 hours. CBG: No results for input(s): GLUCAP in the last 168 hours. Lipid Profile: No results for input(s): CHOL, HDL, LDLCALC, TRIG, CHOLHDL, LDLDIRECT in the last 72 hours. Thyroid Function Tests: No results for input(s): TSH, T4TOTAL, FREET4, T3FREE, THYROIDAB in the last 72 hours. Anemia Panel: No results for input(s): VITAMINB12, FOLATE, FERRITIN, TIBC, IRON, RETICCTPCT in the last 72 hours. Sepsis Labs: No results for input(s): PROCALCITON, LATICACIDVEN in the last 168 hours.  No results found for this or any previous visit (from the past 240 hour(s)).       Radiology Studies: Dg Chest Portable 1 View  Result Date: 08/18/2017 CLINICAL DATA:  Shortness of breath for 3 days. EXAM: PORTABLE CHEST 1 VIEW COMPARISON:  08/08/2017 FINDINGS: Left-sided pacemaker remains in place. Cardiomegaly with increased from prior. Development of pulmonary edema from prior. Possible small pleural effusions. No confluent airspace disease. No pneumothorax. IMPRESSION: CHF. Electronically Signed   By: Jeb Levering M.D.   On: 08/18/2017 02:35        Scheduled Meds: . apixaban  5 mg Oral BID  . aspirin EC  81 mg Oral Daily  . diltiazem  120 mg Oral QPM  . diltiazem  300 mg Oral Daily  . flecainide  150 mg Oral Q12H  . furosemide  60 mg Intravenous BID  . gabapentin  300 mg Oral Daily  . gabapentin  600 mg Oral QHS  . metoprolol tartrate  12.5 mg Oral BID  . morphine  15 mg Oral Q12H  . pantoprazole  40 mg Oral Daily  . polyethylene glycol  17 g Oral Daily  . sodium chloride flush  3 mL Intravenous Q12H  . tamsulosin  0.4 mg Oral Daily   Continuous Infusions: . sodium chloride       LOS: 1 day    Time spent: 1mins    Kathie Dike, MD [facility] Pager [phone]  If 7PM-7AM, please  contact night-coverage [email or web address] Password [email or web address] 08/19/2017, 1:32 PM

## 2017-08-20 ENCOUNTER — Other Ambulatory Visit: Payer: Self-pay

## 2017-08-20 DIAGNOSIS — G4733 Obstructive sleep apnea (adult) (pediatric): Secondary | ICD-10-CM

## 2017-08-20 LAB — BASIC METABOLIC PANEL
Anion gap: 8 (ref 5–15)
BUN: 32 mg/dL — AB (ref 6–20)
CHLORIDE: 98 mmol/L — AB (ref 101–111)
CO2: 32 mmol/L (ref 22–32)
Calcium: 8.8 mg/dL — ABNORMAL LOW (ref 8.9–10.3)
Creatinine, Ser: 1.32 mg/dL — ABNORMAL HIGH (ref 0.61–1.24)
GFR calc Af Amer: 59 mL/min — ABNORMAL LOW (ref >60)
GFR calc non Af Amer: 51 mL/min — ABNORMAL LOW (ref >60)
GLUCOSE: 89 mg/dL (ref 65–99)
POTASSIUM: 3.8 mmol/L (ref 3.5–5.1)
SODIUM: 138 mmol/L (ref 135–145)

## 2017-08-20 MED ORDER — FUROSEMIDE 40 MG PO TABS: 40.0000 mg | ORAL_TABLET | Freq: Two times a day (BID) | ORAL | 0 refills | Status: DC

## 2017-08-20 NOTE — Discharge Summary (Signed)
Physician Discharge Summary  [name]. WUJ:[id number] DOB: 1941/02/21 DOA: 08/18/2017  PCP: [name], MD  Admit date: 08/18/2017 Discharge date: 08/20/2017  Admitted From: home Disposition:  home  Recommendations for Outpatient Follow-up:  1. Follow up with PCP tomorrow as previously scheduled 2. Please obtain BMP/CBC in one week 3. Follow-up with cardiology on 12/17 as previously scheduled  Home Health: Equipment/Devices:  Discharge Condition:stable  CODE STATUS: full code Diet recommendation: Heart Healthy   Brief/Interim Summary: 76 year old male with a history of diastolic heart failure and atrial fibrillation, presented to the hospital with complaints of shortness of breath.  Found to have decompensated heart failure and admitted to the hospital for intravenous diuresis.  Discharge Diagnoses:  Principal Problem:   Acute on chronic diastolic CHF (congestive heart failure) (HCC) Active Problems:   OSA (obstructive sleep apnea)   Paroxysmal atrial fibrillation (HCC)   CKD (chronic kidney disease), stage III (HCC)   Acute respiratory failure with hypoxia (HCC)   Chronic pain   1. Acute on chronic diastolic CHF.    Treated with intravenous Lasix.  Overall volume status appears to be improving.  Intake and output may not have been accurately recorded.  Symptomatically and clinically the patient is improving.    He is able to ambulate on room air without difficulty.  Feels that breathing is back to baseline.  Creatinine is beginning to trend up with diuresis.  We will transition to oral Lasix.  Since he normally takes 60 mg daily at home, this dose will be increased to 40 mg twice daily.  Follow-up with cardiology on 12/17 as previously scheduled. 2. Acute respiratory failure with hypoxia.  Related to decompensated CHF.    Is been weaned off of oxygen and breathing comfortably on room air 3. Obstructive sleep apnea.  Continue CPAP 4. Atrial fibrillation.  Patient is in  a paced rhythm.  Continue on diltiazem and flecainide.  Anticoagulated with Eliquis. 5. Chronic kidney disease stage III.  Creatinine 1.5 on admission, improving after receiving Lasix.  Continue to monitor while diuresing. 6. Chronic back pain.  Continue on home regimen of long-acting morphine.  Discharge Instructions  Discharge Instructions    Diet - low sodium heart healthy   Complete by:  As directed    Increase activity slowly   Complete by:  As directed      Allergies as of 08/20/2017      Reactions   Bee Venom Anaphylaxis, Swelling   Shellfish Allergy Hives   Accelerated allergic reaction; questionable laryngospasm or laryngeal edema with some difficulty breathing prompting ED evaluation and treatment at [facility]   Penicillins Rash   Has patient had a PCN reaction causing immediate rash, facial/tongue/throat swelling, SOB or lightheadedness with hypotension: No Has patient had a PCN reaction causing severe rash involving mucus membranes or skin necrosis: No Has patient had a PCN reaction that required hospitalization No Has patient had a PCN reaction occurring within the last 10 years: No If all of the above answers are "NO", then may proceed with Cephalosporin use.      Medication List    TAKE these medications   acetaminophen 500 MG tablet Commonly known as:  TYLENOL Take 1,300 mg by mouth every 6 (six) hours as needed for mild pain.   apixaban 5 MG Tabs tablet Commonly known as:  ELIQUIS Take 1 tablet (5 mg total) by mouth 2 (two) times daily.   aspirin 81 MG EC tablet Take 1 tablet (81 mg total) by  mouth daily.   CALCIUM-MAGNESIUM-VITAMIN D PO Take 1 tablet by mouth at bedtime.   diltiazem 300 MG 24 hr capsule Commonly known as:  TIAZAC Take 300 mg by mouth daily.   diltiazem 120 MG 24 hr capsule Commonly known as:  TIAZAC Take 120 mg by mouth every evening.   flecainide 100 MG tablet Commonly known as:  TAMBOCOR Take 1.5 tablets (150 mg total) by mouth 2  (two) times daily.   furosemide 40 MG tablet Commonly known as:  LASIX Take 1 tablet (40 mg total) by mouth 2 (two) times daily. What changed:    medication strength  how much to take  when to take this   gabapentin 300 MG capsule Commonly known as:  NEURONTIN Take 300-600 mg by mouth 2 (two) times daily. 300 mg in the morning and 600 mg in the evening   hydrocortisone 2.5 % rectal cream Commonly known as:  ANUSOL-HC Place 1 application rectally as needed for hemorrhoids or itching.   ketoconazole 2 % cream Commonly known as:  NIZORAL Apply 1 application topically daily.   lidocaine 5 % Commonly known as:  LIDODERM Place 1 patch onto the skin daily. Remove & Discard patch within 12 hours or as directed by MD   metoprolol tartrate 25 MG tablet Commonly known as:  LOPRESSOR Take 0.5 tablets (12.5 mg total) by mouth 2 (two) times daily.   morphine 15 MG 12 hr tablet Commonly known as:  MS CONTIN Take 15 mg by mouth every 12 (twelve) hours.   pantoprazole 40 MG tablet Commonly known as:  PROTONIX Take 40 mg by mouth daily.   polyethylene glycol packet Commonly known as:  MIRALAX / GLYCOLAX Take 17 g by mouth daily.   psyllium 58.6 % powder Commonly known as:  METAMUCIL Take 1 packet by mouth daily.   tamsulosin 0.4 MG Caps capsule Commonly known as:  FLOMAX Take 0.4 mg by mouth daily.   zinc oxide 11.3 % Crea cream Commonly known as:  BALMEX Apply 1 application topically 2 (two) times daily.       Allergies  Allergen Reactions  . Bee Venom Anaphylaxis and Swelling  . Shellfish Allergy Hives    Accelerated allergic reaction; questionable laryngospasm or laryngeal edema with some difficulty breathing prompting ED evaluation and treatment at [facility]  . Penicillins Rash    Has patient had a PCN reaction causing immediate rash, facial/tongue/throat swelling, SOB or lightheadedness with hypotension: No Has patient had a PCN reaction causing severe rash  involving mucus membranes or skin necrosis: No Has patient had a PCN reaction that required hospitalization No Has patient had a PCN reaction occurring within the last 10 years: No If all of the above answers are "NO", then may proceed with Cephalosporin use.     Consultations:     Procedures/Studies: Dg Chest 2 View  Result Date: 08/08/2017 CLINICAL DATA:  Chest pain with abnormal electrocardiogram. Cardiac arrhythmia. EXAM: CHEST  2 VIEW COMPARISON:  August 06, 2017. FINDINGS: Pacemaker leads are attached to the right atrium and right ventricle, stable. There is slight cardiomegaly with mild pulmonary venous hypertension. There is fibrotic type change in the lung bases. There are areas of scattered scarring with peribronchial thickening. There is no frank edema or consolidation. There is a stable small calcified granuloma in the right base. No adenopathy. There is aortic atherosclerosis. There is postoperative change in the lower cervical spine. There is degenerative change in the thoracic spine. IMPRESSION: There is a degree of  underlying pulmonary vascular congestion. There is chronic interstitial thickening, primarily felt to be due to fibrosis. There is also evidence of chronic bronchitis. No airspace consolidation. There is a small calcified granuloma in the right base. There is aortic atherosclerosis. Pacemaker leads are attached the right atrium and right ventricle. No evident adenopathy. Aortic Atherosclerosis (ICD10-I70.0). Electronically Signed   By: Lowella Grip III M.D.   On: 08/08/2017 13:45   Dg Chest 2 View  Result Date: 08/06/2017 CLINICAL DATA:  Dyspnea worsening over the past few days. Former smoker. EXAM: CHEST  2 VIEW COMPARISON:  01/08/2017 and 09/03/2016 FINDINGS: Stable cardiomegaly. Left-sided pacemaker apparatus with right atrial and right ventricular leads are re- demonstrated. There is aortic atherosclerosis at the arch without aneurysm. Chronic bilateral  interstitial prominence may reflect chronic bronchitic change. Stable rounded 6 mm density is stable dating back to 2017 possibly representing a small granuloma or bone island versus vascular shadow on-end. Scarring and/or atelectasis along the right heart border and periphery of the right lower lobe. ACDF of the lower cervical spine. No acute nor suspicious osseous abnormality. IMPRESSION: Stable cardiomegaly with aortic atherosclerosis. Chronic bronchitic change of the lungs. 6 mm rounded nodular density in the lower lobe is stable dating back to 2017 likely representing a benign finding. Electronically Signed   By: Ashley Royalty M.D.   On: 08/06/2017 15:11   Dg Chest Portable 1 View  Result Date: 08/18/2017 CLINICAL DATA:  Shortness of breath for 3 days. EXAM: PORTABLE CHEST 1 VIEW COMPARISON:  08/08/2017 FINDINGS: Left-sided pacemaker remains in place. Cardiomegaly with increased from prior. Development of pulmonary edema from prior. Possible small pleural effusions. No confluent airspace disease. No pneumothorax. IMPRESSION: CHF. Electronically Signed   By: Jeb Levering M.D.   On: 08/18/2017 02:35       Subjective: Overall breathing is better.  Able to ambulate on room air without difficulty.  Feels that he slept better overnight.  Discharge Exam: Vitals:   08/20/17 0046 08/20/17 0600  BP:  (!) 95/55  Pulse: 75 70  Resp: 16 18  Temp:  97.8 F (36.6 C)  SpO2: 93% 96%   Vitals:   08/19/17 1506 08/19/17 2140 08/20/17 0046 08/20/17 0600  BP:  100/61  (!) 95/55  Pulse:  74 75 70  Resp:  20 16 18   Temp:  98.2 F (36.8 C)  97.8 F (36.6 C)  TempSrc:  Oral  Axillary  SpO2: 97% 93% 93% 96%  Weight:    107.5 kg (237 lb)  Height:        General: Pt is alert, awake, not in acute distress Cardiovascular: RRR, S1/S2 +, no rubs, no gallops Respiratory: Coarse breath sounds at bases Abdominal: Soft, NT, ND, bowel sounds + Extremities: no edema, no cyanosis    The results of  significant diagnostics from this hospitalization (including imaging, microbiology, ancillary and laboratory) are listed below for reference.     Microbiology: No results found for this or any previous visit (from the past 240 hour(s)).   Labs: BNP (last 3 results) Recent Labs    08/08/17 1230 08/18/17 0121  BNP 870.0* 235.3*   Basic Metabolic Panel: Recent Labs  Lab 08/18/17 0121 08/19/17 0514 08/20/17 0428  NA 134* 136 138  K 4.1 4.1 3.8  CL 97* 99* 98*  CO2 27 29 32  GLUCOSE 126* 127* 89  BUN 26* 28* 32*  CREATININE 1.51* 1.26* 1.32*  CALCIUM 10.0 8.9 8.8*   Liver Function Tests: No results for  input(s): AST, ALT, ALKPHOS, BILITOT, PROT, ALBUMIN in the last 168 hours. No results for input(s): LIPASE, AMYLASE in the last 168 hours. No results for input(s): AMMONIA in the last 168 hours. CBC: Recent Labs  Lab 08/18/17 0121 08/19/17 0514  WBC 9.1 13.6*  NEUTROABS 5.5  --   HGB 13.2 11.8*  HCT 41.1 38.0*  MCV 97.6 98.7  PLT 190 182   Cardiac Enzymes: Recent Labs  Lab 08/18/17 0121 08/18/17 0646 08/18/17 1245  TROPONINI <0.03 <0.03 <0.03   BNP: Invalid input(s): POCBNP CBG: No results for input(s): GLUCAP in the last 168 hours. D-Dimer No results for input(s): DDIMER in the last 72 hours. Hgb A1c No results for input(s): HGBA1C in the last 72 hours. Lipid Profile No results for input(s): CHOL, HDL, LDLCALC, TRIG, CHOLHDL, LDLDIRECT in the last 72 hours. Thyroid function studies No results for input(s): TSH, T4TOTAL, T3FREE, THYROIDAB in the last 72 hours.  Invalid input(s): FREET3 Anemia work up No results for input(s): VITAMINB12, FOLATE, FERRITIN, TIBC, IRON, RETICCTPCT in the last 72 hours. Urinalysis    Component Value Date/Time   COLORURINE YELLOW 08/08/2017 1307   APPEARANCEUR CLEAR 08/08/2017 1307   LABSPEC 1.019 08/08/2017 1307   PHURINE 5.0 08/08/2017 1307   GLUCOSEU NEGATIVE 08/08/2017 1307   HGBUR NEGATIVE 08/08/2017 [place] 08/08/2017 1307   KETONESUR NEGATIVE 08/08/2017 1307   PROTEINUR NEGATIVE 08/08/2017 1307   UROBILINOGEN 0.2 05/28/2015 1100   NITRITE NEGATIVE 08/08/2017 1307   LEUKOCYTESUR NEGATIVE 08/08/2017 1307   Sepsis Labs Invalid input(s): PROCALCITONIN,  WBC,  LACTICIDVEN Microbiology No results found for this or any previous visit (from the past 240 hour(s)).   Time coordinating discharge: Over 30 minutes  SIGNED:   Kathie Dike, MD  [facility] 08/20/2017, 2:10 PM Pager   If 7PM-7AM, please contact night-coverage [email or web address] Password [email or web address]

## 2017-08-20 NOTE — Progress Notes (Signed)
Patient IV removed, telemetry removed, tolerated well. Patient given discharge instructions at bedside.  

## 2017-08-20 NOTE — Care Management Important Message (Signed)
Important Message  Patient Details  Name: [name]. MRN: [id number] Date of Birth: [birth year]   Medicare Important Message Given:  Yes    Duru Reiger, Chauncey [place], RN 08/20/2017, 2:18 PM

## 2017-08-20 NOTE — Care Management Note (Signed)
Case Management Note  Patient Details  Name: [name]. MRN: [id number] Date of Birth: 05-04-1941    Expected Discharge Date:  08/20/17               Expected Discharge Plan:  [place]  In-House Referral:     Discharge planning Services  CM Consult  Post Acute Care Choice:  Home Health Choice offered to:  Patient  DME Arranged:    DME Agency:     HH Arranged:  RN [place] Agency:  [facility]  Status of Service:  Completed, signed off  If discussed at [place] of Stay Meetings, dates discussed:    Additional Comments: Patient discharging home today. RN ordered. Discussed with patient, he is agreeable. Would like [facility]. Vaughan Basta of [facility] notified and will obtain orders from [place]. RN assessing need for oxygen.   Minervia Osso, Chauncey [place], RN 08/20/2017, 2:19 PM

## 2017-08-20 NOTE — Patient Outreach (Signed)
[facility]) Care Management  08/20/2017  [name]. 03/05/1941 [id number]   RN Health Coach notified that the patient was admitted to the hospital  On 08/18/2017 for Acute on chronic diastolic CHF (congestive heart failure) per the record. Hospital Liaison notified.   Lazaro Arms RN, BSN, [place] Direct Dial:  [phone] Fax: [phone]

## 2017-08-21 ENCOUNTER — Other Ambulatory Visit: Payer: Self-pay | Admitting: *Deleted

## 2017-08-21 DIAGNOSIS — I5033 Acute on chronic diastolic (congestive) heart failure: Secondary | ICD-10-CM | POA: Diagnosis not present

## 2017-08-21 DIAGNOSIS — J9601 Acute respiratory failure with hypoxia: Secondary | ICD-10-CM | POA: Diagnosis not present

## 2017-08-21 NOTE — Consult Note (Signed)
  Received an in-basket alerting this liaison to patient's admission to [facility] from [place]. Unfortunately patient discharged before this liaison could engage.  Order rerouted to [place] related to patient's diagnosis of HF and for having 2 admits and 3 ED visits in the last 6 months. For questions or concerns please contact:  Era Parr RN, [facility] Hospital Liaison  [phone]) Business Mobile [phone]) Toll free office '

## 2017-08-22 ENCOUNTER — Other Ambulatory Visit: Payer: Self-pay | Admitting: *Deleted

## 2017-08-22 ENCOUNTER — Encounter: Payer: Self-pay | Admitting: *Deleted

## 2017-08-22 NOTE — Patient Outreach (Addendum)
Referral received from hospital liason, pt hospitalized 12/1-12/3/18 for CHF exacerbation, telephone call to pt for transition of care week 1, spoke with pt, HIPAA verified, pt reports he lives alone and has support of his 2 daughters who assist as needed, pt has transportation, has all medications and verbalizes able to afford, pt weighs daily with weight today 232 pounds, home health ([facility]) has contacted pt and will be seeing him tomorrow. Pt verbalizes he is attending all scheduled MD appointments. Pt agreeable to weekly transition of care calls and home visit.  Outpatient Encounter Medications as of 08/22/2017  Medication Sig  . acetaminophen (TYLENOL) 500 MG tablet Take 1,300 mg by mouth every 6 (six) hours as needed for mild pain.   Marland Kitchen apixaban (ELIQUIS) 5 MG TABS tablet Take 1 tablet (5 mg total) by mouth 2 (two) times daily.  Marland Kitchen CALCIUM-MAGNESIUM-VITAMIN D PO Take 1 tablet by mouth at bedtime.   Marland Kitchen diltiazem (TIAZAC) 120 MG 24 hr capsule Take 120 mg by mouth every evening.   . diltiazem (TIAZAC) 300 MG 24 hr capsule Take 300 mg by mouth daily.   . flecainide (TAMBOCOR) 100 MG tablet Take 1.5 tablets (150 mg total) by mouth 2 (two) times daily.  . furosemide (LASIX) 40 MG tablet Take 1 tablet (40 mg total) by mouth 2 (two) times daily.  Marland Kitchen gabapentin (NEURONTIN) 300 MG capsule Take 300-600 mg by mouth 2 (two) times daily. 300 mg in the morning and 600 mg in the evening  . hydrocortisone (ANUSOL-HC) 2.5 % rectal cream Place 1 application rectally as needed for hemorrhoids or itching.   Marland Kitchen ketoconazole (NIZORAL) 2 % cream Apply 1 application topically daily.  Marland Kitchen lidocaine (LIDODERM) 5 % Place 1 patch onto the skin daily. Remove & Discard patch within 12 hours or as directed by MD  . metoprolol tartrate (LOPRESSOR) 25 MG tablet Take 0.5 tablets (12.5 mg total) by mouth 2 (two) times daily.  Marland Kitchen morphine (MS CONTIN) 15 MG 12 hr tablet Take 15 mg by mouth every 12 (twelve) hours.  .  pantoprazole (PROTONIX) 40 MG tablet Take 40 mg by mouth daily.  . polyethylene glycol (MIRALAX / GLYCOLAX) packet Take 17 g by mouth daily.  . psyllium (METAMUCIL) 58.6 % powder Take 1 packet by mouth daily.   . Tamsulosin HCl (FLOMAX) 0.4 MG CAPS Take 0.4 mg by mouth daily.   Marland Kitchen zinc oxide (BALMEX) 11.3 % CREA cream Apply 1 application topically 2 (two) times daily.  Marland Kitchen aspirin EC 81 MG EC tablet Take 1 tablet (81 mg total) by mouth daily. (Patient not taking: Reported on 08/22/2017)   No facility-administered encounter medications on file as of 08/22/2017.     THN CM Care Plan Problem One     Most Recent Value  Care Plan Problem One  Knowledge deficit related to CHF  Role Documenting the Problem One  Care Management Coordinator  Care Plan for Problem One  Active  THN Long Term Goal   pt will verbalize/ demonstrate improved self care for CHF to facilitate better outcomes within 60 days  THN Long Term Goal Start Date  08/22/17  Interventions for Problem One Long Term Goal  RN CM gave [facility] contact number, reviewed medications over the phone, faxed barrier letter to primary MD Dr. Willey Blade and ask if pt is supposed to take aspirin 81 mg, it is on discharge summary and pt has on hand but was not sure whether to take.  THN CM Short Term  Goal #1   pt will verbalize CHF zones within 30 days  THN CM Short Term Goal #1 Start Date  08/22/17  Interventions for Short Term Goal #1  RN CM reviewed CHF zones, importance of calling MD for change in health status.      PLAN See pt for initial home visit next week Continue weekly transition of care calls  Jacqlyn Larsen [facility], [place] Coordinator [phone]

## 2017-08-23 ENCOUNTER — Encounter: Payer: Self-pay | Admitting: *Deleted

## 2017-08-23 NOTE — Telephone Encounter (Signed)
This encounter was created in error - please disregard.

## 2017-08-28 ENCOUNTER — Other Ambulatory Visit: Payer: Self-pay | Admitting: *Deleted

## 2017-08-28 DIAGNOSIS — Z79899 Other long term (current) drug therapy: Secondary | ICD-10-CM | POA: Diagnosis not present

## 2017-08-28 DIAGNOSIS — I4891 Unspecified atrial fibrillation: Secondary | ICD-10-CM | POA: Diagnosis not present

## 2017-08-28 DIAGNOSIS — J9601 Acute respiratory failure with hypoxia: Secondary | ICD-10-CM | POA: Diagnosis not present

## 2017-08-28 DIAGNOSIS — I5033 Acute on chronic diastolic (congestive) heart failure: Secondary | ICD-10-CM | POA: Diagnosis not present

## 2017-08-28 DIAGNOSIS — I5030 Unspecified diastolic (congestive) heart failure: Secondary | ICD-10-CM | POA: Diagnosis not present

## 2017-08-28 NOTE — Patient Outreach (Signed)
Red EMMI received, pt weight 230 pounds (ranges 233, 226, 230 pounds), spoke with pt, HIPAA verified, pt reports " that's not correct, my weight is 228 pounds"  Pt states he saw primary MD today "and everything checked out good, no changes made"  PLAN See pt for initial home visit this week  Jacqlyn Larsen [facility], [place] Coordinator [phone]

## 2017-08-30 ENCOUNTER — Encounter: Payer: Self-pay | Admitting: *Deleted

## 2017-08-30 ENCOUNTER — Other Ambulatory Visit: Payer: Self-pay | Admitting: *Deleted

## 2017-08-30 NOTE — Patient Outreach (Signed)
[facility]) Care Management   08/30/2017  [name]. 27-Nov-1940 [id number]  [name]. is an 76 y.o. male  Subjective: Initial home visit with pt, HIPAA verified, pt reports " I'm weighing daily and keeping up with that and I check my blood pressure"  Pt states his adult daughters assist him if needed, pt lives alone and is mostly independent with ADL's/ IADL's, Pt reports home health discharged because " I drive now, not home bound"    Objective:   Vitals:   08/30/17 1247  BP: 128/64  Pulse: 80  Resp: 16  SpO2: 96%  Weight: 230 lb (104.3 kg)  Height: 1.727 m (5\' 8" )   ROS  Physical Exam  Constitutional: He is oriented to person, place, and time. He appears well-developed and well-nourished.  HENT:  Head: Normocephalic.  Neck: Normal range of motion. Neck supple.  Cardiovascular: Normal rate and regular rhythm.  Respiratory: Effort normal.  GI: Soft. Bowel sounds are normal.  Musculoskeletal: Normal range of motion. He exhibits no edema.  Neurological: He is alert and oriented to person, place, and time.  Skin: Skin is warm and dry.  Psychiatric: He has a normal mood and affect. His behavior is normal. Thought content normal.    Encounter Medications:   Outpatient Encounter Medications as of 08/30/2017  Medication Sig  . acetaminophen (TYLENOL) 500 MG tablet Take 1,300 mg by mouth every 6 (six) hours as needed for mild pain.   Marland Kitchen apixaban (ELIQUIS) 5 MG TABS tablet Take 1 tablet (5 mg total) by mouth 2 (two) times daily.  Marland Kitchen CALCIUM-MAGNESIUM-VITAMIN D PO Take 1 tablet by mouth at bedtime.   Marland Kitchen diltiazem (TIAZAC) 120 MG 24 hr capsule Take 120 mg by mouth every evening.   . diltiazem (TIAZAC) 300 MG 24 hr capsule Take 300 mg by mouth daily.   . flecainide (TAMBOCOR) 100 MG tablet Take 1.5 tablets (150 mg total) by mouth 2 (two) times daily.  . furosemide (LASIX) 40 MG tablet Take 1 tablet (40 mg total) by mouth 2 (two) times daily.   Marland Kitchen gabapentin (NEURONTIN) 300 MG capsule Take 300-600 mg by mouth 2 (two) times daily. 300 mg in the morning and 600 mg in the evening  . ketoconazole (NIZORAL) 2 % cream Apply 1 application topically daily.  . metoprolol tartrate (LOPRESSOR) 25 MG tablet Take 0.5 tablets (12.5 mg total) by mouth 2 (two) times daily.  Marland Kitchen morphine (MS CONTIN) 15 MG 12 hr tablet Take 15 mg by mouth every 12 (twelve) hours.  . pantoprazole (PROTONIX) 40 MG tablet Take 40 mg by mouth daily.  . polyethylene glycol (MIRALAX / GLYCOLAX) packet Take 17 g by mouth daily.  . psyllium (METAMUCIL) 58.6 % powder Take 1 packet by mouth daily.   . Tamsulosin HCl (FLOMAX) 0.4 MG CAPS Take 0.4 mg by mouth daily.   Marland Kitchen zinc oxide (BALMEX) 11.3 % CREA cream Apply 1 application topically 2 (two) times daily.  Marland Kitchen aspirin EC 81 MG EC tablet Take 1 tablet (81 mg total) by mouth daily. (Patient not taking: Reported on 08/22/2017)  . hydrocortisone (ANUSOL-HC) 2.5 % rectal cream Place 1 application rectally as needed for hemorrhoids or itching.   . lidocaine (LIDODERM) 5 % Place 1 patch onto the skin daily. Remove & Discard patch within 12 hours or as directed by MD   No facility-administered encounter medications on file as of 08/30/2017.     Functional Status:   In your present  state of health, do you have any difficulty performing the following activities: 08/30/2017 08/18/2017  Hearing? N N  Vision? N N  Difficulty concentrating or making decisions? Tempie Donning  Walking or climbing stairs? N N  Dressing or bathing? N N  Doing errands, shopping? N N  Preparing Food and eating ? N -  Using the Toilet? N -  In the past six months, have you accidently leaked urine? N -  Do you have problems with loss of bowel control? N -  Managing your Medications? N -  Managing your Finances? N -  Some recent data might be hidden    Fall/Depression Screening:    Fall Risk  08/30/2017 07/24/2017  Falls in the past year? Yes Yes  Number falls in past  yr: 1 1  Injury with Fall? No Yes  Comment - hurt head and wrist.    Risk for fall due to : History of fall(s);Medication side effect -  Follow up Falls evaluation completed;Education provided (No Data)  Comment - fell jumping for a limb   PHQ 2/9 Scores 08/30/2017 08/17/2017 07/24/2017  PHQ - 2 Score 0 0 0    Assessment:  RN CM observed medication bottles and reviewed with pt, reviewed importance of daily weights and CHF action plan.  Gave pt medication box for better organization, pt verbalizes good understanding of medications.  RN CM reviewed safety precautions.  RN CM faxed barrier letter and initial home visit to primary MD Dr. Willey Blade.  THN CM Care Plan Problem One     Most Recent Value  Care Plan Problem One  Knowledge deficit related to CHF  Role Documenting the Problem One  Care Management Coordinator  Care Plan for Problem One  Active  THN Long Term Goal   pt will verbalize/ demonstrate improved self care for CHF to facilitate better outcomes within 60 days  THN Long Term Goal Start Date  08/22/17  Interventions for Problem One Long Term Goal  RN CM gave 24 hour nurse line magnet, CHF folder/ materials with CHF zones/ action plan, [facility] calendar with section for daily weights  THN CM Short Term Goal #1   pt will verbalize CHF zones within 30 days  THN CM Short Term Goal #1 Start Date  08/22/17  Interventions for Short Term Goal #1  RN CM gave copy CHF zones and reviewed with pt, importance of calling MD for change in health status.    [facility] CM Care Plan Problem Two     Most Recent Value  Care Plan Problem Two  Difficulty with managing chronic pain  Role Documenting the Problem Two  Care Management Coordinator  Care Plan for Problem Two  Active  THN CM Short Term Goal #1   Pt will contact [facility] to discuss injections for back pain within 30 days  THN CM Short Term Goal #1 Start Date  08/30/17  Interventions for Short Term Goal #2   RN CM talked with pt about  techniques such as relaxation for better pain control, pt states he is going to discuss injections (as had been previously mentioned by MD) for better control of back pain.      Plan: continue weekly transition of care calls See pt for home visit next month  Jacqlyn Larsen [facility], [place] Coordinator [phone]

## 2017-09-03 ENCOUNTER — Encounter: Payer: Self-pay | Admitting: Internal Medicine

## 2017-09-03 ENCOUNTER — Ambulatory Visit: Payer: Medicare Other | Admitting: Internal Medicine

## 2017-09-03 ENCOUNTER — Telehealth: Payer: Self-pay | Admitting: Internal Medicine

## 2017-09-03 DIAGNOSIS — I495 Sick sinus syndrome: Secondary | ICD-10-CM

## 2017-09-03 LAB — CUP PACEART INCLINIC DEVICE CHECK
Battery Voltage: 2.73 V
Implantable Lead Implant Date: 20090903
Implantable Lead Location: 753860
Implantable Pulse Generator Implant Date: 20090903
Lead Channel Impedance Value: 399 Ohm
Lead Channel Pacing Threshold Amplitude: 1.25 V
Lead Channel Sensing Intrinsic Amplitude: 0.8 mV
Lead Channel Setting Pacing Amplitude: 2.5 V
Lead Channel Setting Pacing Amplitude: 2.5 V
Lead Channel Setting Pacing Pulse Width: 0.8 ms
Lead Channel Setting Sensing Sensitivity: 2 mV
MDC IDC LEAD IMPLANT DT: 20090903
MDC IDC LEAD LOCATION: 753859
MDC IDC MSMT BATTERY IMPEDANCE: 3700 Ohm
MDC IDC MSMT LEADCHNL RV IMPEDANCE VALUE: 481 Ohm
MDC IDC MSMT LEADCHNL RV PACING THRESHOLD PULSEWIDTH: 0.8 ms
MDC IDC MSMT LEADCHNL RV SENSING INTR AMPL: 6.4 mV
MDC IDC SESS DTM: 20181217143948
Pulse Gen Serial Number: 1269181

## 2017-09-03 NOTE — Telephone Encounter (Signed)
Patient calling to confirm that Dr.Taylor stated he did not convert to NSR / tg

## 2017-09-03 NOTE — Patient Instructions (Signed)
Medication Instructions:  Your physician recommends that you continue on your current medications as directed. Please refer to the Current Medication list given to you today.   You may take an Extra Lasix on the days that your weight is above 230 lbs.   Labwork: NONE   Testing/Procedures: NONE   Follow-Up: Your physician recommends that you schedule a follow-up appointment in: 6 Months with the [facility].   Your physician wants you to follow-up in: 1 Year with Dr. Lovena Le. You will receive a reminder letter in the mail two months in advance. If you don't receive a letter, please call our office to schedule the follow-up appointment.    Any Other Special Instructions Will Be Listed Below (If Applicable).     If you need a refill on your cardiac medications before your next appointment, please call your pharmacy.  Thank you for choosing [place]!

## 2017-09-03 NOTE — Progress Notes (Signed)
HPI Mr. Keaney returns today for ongoing evaluation and management of diastolic heart failure, paroxysmal atrial arrhythmias, obesity, and complete heart block status post pacemaker insertion.  He has a history of paroxysmal/persistent atrial fibrillation and flutter.  The patient was seen in the emergency room 2 weeks ago with acute diastolic heart failure and treated with intravenous diuretic therapy.  He is improved but notes that his weight has trended higher.  He does admit to dietary indiscretion.  He does not have palpitations. Allergies  Allergen Reactions  . Bee Venom Anaphylaxis and Swelling  . Shellfish Allergy Hives    Accelerated allergic reaction; questionable laryngospasm or laryngeal edema with some difficulty breathing prompting ED evaluation and treatment at [facility]  . Penicillins Rash    Has patient had a PCN reaction causing immediate rash, facial/tongue/throat swelling, SOB or lightheadedness with hypotension: No Has patient had a PCN reaction causing severe rash involving mucus membranes or skin necrosis: No Has patient had a PCN reaction that required hospitalization No Has patient had a PCN reaction occurring within the last 10 years: No If all of the above answers are "NO", then may proceed with Cephalosporin use.      Current Outpatient Medications  Medication Sig Dispense Refill  . acetaminophen (TYLENOL) 650 MG CR tablet Take 650 mg by mouth every 8 (eight) hours as needed for pain.    Marland Kitchen apixaban (ELIQUIS) 5 MG TABS tablet Take 1 tablet (5 mg total) by mouth 2 (two) times daily. 60 tablet 1  . CALCIUM-MAGNESIUM-VITAMIN D PO Take 1 tablet by mouth at bedtime.     Marland Kitchen diltiazem (TIAZAC) 120 MG 24 hr capsule Take 120 mg by mouth every evening.     . diltiazem (TIAZAC) 300 MG 24 hr capsule Take 300 mg by mouth daily.     . flecainide (TAMBOCOR) 100 MG tablet Take 1.5 tablets (150 mg total) by mouth 2 (two) times daily. 270 tablet 3  . furosemide (LASIX) 40  MG tablet Take 1 tablet (40 mg total) by mouth 2 (two) times daily. 60 tablet 0  . gabapentin (NEURONTIN) 300 MG capsule Take 600 mg by mouth 2 (two) times daily.     . hydrocortisone (ANUSOL-HC) 2.5 % rectal cream Place 1 application rectally as needed for hemorrhoids or itching.     Marland Kitchen ketoconazole (NIZORAL) 2 % cream Apply 1 application topically daily.    . metoprolol tartrate (LOPRESSOR) 25 MG tablet Take 0.5 tablets (12.5 mg total) by mouth 2 (two) times daily. 90 tablet 3  . morphine (MS CONTIN) 15 MG 12 hr tablet Take 15 mg by mouth every 12 (twelve) hours.    . pantoprazole (PROTONIX) 40 MG tablet Take 40 mg by mouth daily.    . polyethylene glycol (MIRALAX / GLYCOLAX) packet Take 17 g by mouth daily.    . psyllium (METAMUCIL) 58.6 % powder Take 1 packet by mouth daily.     . Tamsulosin HCl (FLOMAX) 0.4 MG CAPS Take 0.4 mg by mouth every other day.     . zinc oxide (BALMEX) 11.3 % CREA cream Apply 1 application topically 2 (two) times daily as needed.      No current facility-administered medications for this visit.      Past Medical History:  Diagnosis Date  . Atrial flutter ([place])    a. s/p ablation 01/2015.  Marland Kitchen BPH (benign prostatic hyperplasia)   . Cerebrovascular disease    carotid bruit; no focal stenosis by carotid ultrasound  in 05/2008  . Congestive heart failure (CHF) ([place])   . Dysrhythmia   . General weakness    +malaise, exercise intolerance, and near syncope  . Hypertension   . Low back pain    history of traumax2  . Paroxysmal atrial fibrillation (HCC)    on Eliquis  . Presence of permanent cardiac pacemaker 2009  . Sleep apnea    c pap at night  . Tachy-brady syndrome ([place])    a. s/p St. Jude pacemaker 2009.    ROS:   All systems reviewed and negative except as noted in the HPI.   Past Surgical History:  Procedure Laterality Date  . ABLATION    . CATARACT EXTRACTION     Bilateral  . CERVICAL DISCECTOMY  2006  . COLONOSCOPY WITH PROPOFOL N/A  01/26/2017   Procedure: COLONOSCOPY WITH PROPOFOL;  Surgeon: [name], MD;  Location: [facility];  Service: Endoscopy;  Laterality: N/A;  8:30  . ELECTROPHYSIOLOGIC STUDY N/A 01/18/2015   Procedure: A-Flutter/A-Tach/Svt Ablation;  Surgeon: [name], MD;  Location: [facility]ASIVE CV LAB CUPID;  Service: Cardiovascular;  Laterality: N/A;  . KNEE ARTHROSCOPY WITH MEDIAL MENISECTOMY Left 06/03/2015   Procedure: KNEE ARTHROSCOPY WITH MEDIAL MENISECTOMY;  Surgeon: [name], MD;  Location: [facility] ORS;  Service: Orthopedics;  Laterality: Left;  . PACEMAKER INSERTION  1966  . POLYPECTOMY  01/26/2017   Procedure: POLYPECTOMY;  Surgeon: [name], MD;  Location: [facility];  Service: Endoscopy;;  colon     Family History  Problem Relation Age of Onset  . Hypertension Unknown   . Neuropathy Neg Hx   . Stroke Neg Hx      Social History   Socioeconomic History  . Marital status: Divorced    Spouse name: Not on file  . Number of children: 3  . Years of education: 12+  . Highest education level: Not on file  Social Needs  . Financial resource strain: Not on file  . Food insecurity - worry: Not on file  . Food insecurity - inability: Not on file  . Transportation needs - medical: Not on file  . Transportation needs - non-medical: Not on file  Occupational History  . Occupation: Retired  Tobacco Use  . Smoking status: Former Smoker    Packs/day: 1.00    Years: 0.50    Pack years: 0.50    Types: Cigarettes    Last attempt to quit: 03/27/1962    Years since quitting: 55.4  . Smokeless tobacco: Never Used  Substance and Sexual Activity  . Alcohol use: No    Alcohol/week: 0.0 oz  . Drug use: No  . Sexual activity: Not Currently  Other Topics Concern  . Not on file  Social History Narrative   Divorced with 3 adult children. Semi retired.    Caffeine use: once daily     BP 108/64   Pulse 75   Ht 5\' 8"  (1.727 m)   Wt 240 lb (108.9 kg)   SpO2 94% Comment: on  room air  BMI 36.49 kg/m   Physical Exam:  Well appearing obese, 76 year old man, NAD HEENT: Unremarkable Neck: 6 cm JVD, no thyromegally Lymphatics:  No adenopathy Back:  No CVA tenderness Lungs:  Clear, with no wheezes, rales, or rhonchi HEART:  Regular rate rhythm, no murmurs, no rubs, no clicks Abd:  soft, positive bowel sounds, no organomegally, no rebound, no guarding Ext:  2 plus pulses, trace peripheral edema, no cyanosis, no clubbing  Skin:  No rashes no nodules Neuro:  CN II through XII intact, motor grossly intact  DEVICE  Normal device function.  See PaceArt for details.  Underlying rhythm is atrial fib/flutter  Assess/Plan: 1.  Acute on chronic diastolic heart failure -his weight is up a couple of pounds today and I asked the patient to go home and take an additional Lasix.  His instructions are to take 2 Lasix in the morning and 1 in the evening until his weight is down to under 230 pounds.  Then he will take 40 mg in the morning and 40 mg in the evening.  He is strongly encouraged to reduce his salt intake and lose weight. 2.  Persistent atrial arrhythmias -he has both atrial fibrillation and atypical flutter.  We tried to pace him into sinus rhythm today in the office and this was unsuccessful.  He will continue flecainide.  If he remains out of rhythm, we would consider abandoning this medication and switching to another antiarrhythmic or allowing him to be rate controlled alone. 3.  Obesity -he is encouraged to lose weight. 4.  Chronic renal insufficiency -he is encouraged to maintain a low-sodium low-protein diet.  Crissie Sickles, MD

## 2017-09-03 NOTE — Telephone Encounter (Signed)
Pt informed that he did not convert to NSR during visit today.

## 2017-09-04 ENCOUNTER — Encounter: Payer: Self-pay | Admitting: Internal Medicine

## 2017-09-17 ENCOUNTER — Other Ambulatory Visit: Payer: Self-pay | Admitting: *Deleted

## 2017-09-17 NOTE — Patient Outreach (Signed)
Telephone call to pt for follow up on Red EMMI, weight 09/14/17 232 pounds, pt states he took "extra lasix per MD order", weight today 231 pounds, pt verbalizes CHF action plan.  PLAN See pt for visit next week  Jacqlyn Larsen [facility], BSN [place] Coordinator [phone]

## 2017-09-24 ENCOUNTER — Other Ambulatory Visit: Payer: Self-pay | Admitting: *Deleted

## 2017-09-24 DIAGNOSIS — I480 Paroxysmal atrial fibrillation: Secondary | ICD-10-CM | POA: Diagnosis not present

## 2017-09-24 DIAGNOSIS — I5032 Chronic diastolic (congestive) heart failure: Secondary | ICD-10-CM | POA: Diagnosis not present

## 2017-09-24 DIAGNOSIS — Z79899 Other long term (current) drug therapy: Secondary | ICD-10-CM | POA: Diagnosis not present

## 2017-09-24 NOTE — Patient Outreach (Signed)
Red EMMI flag for 09/22/17 stating pt has new or worsening symptoms, telephone call to pt for follow up regarding this information, spoke with pt, HIPAA verified, pt reports " No, that's not right, everything is fine today and has been ok"  No new concerns or problems reported.  PLAN See pt for home visit this week  Jacqlyn Larsen [facility], [place] Coordinator [phone]

## 2017-09-26 ENCOUNTER — Other Ambulatory Visit: Payer: Self-pay | Admitting: *Deleted

## 2017-09-26 NOTE — Patient Outreach (Signed)
Received 2 Red EMMI flags for 09/25/17, weight 233 pounds (231 the day before), answered question as yes having difficulty with medications, telephone call to pt, spoke with pt, HIPAA verified, pt reports weight ranges 231-233 pounds and pt states he is having no issues with medications and states " that call is aggravating and puts wrong answers that I didn't say"  Pt requests to be removed from [facility] calls, RN CM sent in basket to Michie requesting pt be removed.  PLAN See pt for home visit tomorrow  Jacqlyn Larsen [facility], [place] Coordinator [phone]

## 2017-09-27 ENCOUNTER — Other Ambulatory Visit: Payer: Self-pay | Admitting: *Deleted

## 2017-09-27 ENCOUNTER — Encounter: Payer: Self-pay | Admitting: *Deleted

## 2017-09-27 NOTE — Patient Outreach (Signed)
[facility]) Care Management   09/27/2017  [name]. [birth year] [id number]  [name]. is an 77 y.o. male  Subjective: Routine home visit with pt, HIPAA verified, pt reports he has seen primary care MD with no changes and saw Dr. Lovena Le and lasix increased, pt states he is taking medications as prescribed.  Objective:   Vitals:   09/27/17 1225  BP: 116/60  Pulse: 70  Resp: 18  SpO2: 96%  Weight: 229 lb (103.9 kg)   ROS  Physical Exam  Constitutional: He is oriented to person, place, and time. He appears well-developed and well-nourished.  HENT:  Head: Normocephalic.  Neck: Normal range of motion. Neck supple.  Cardiovascular: Normal rate.  Respiratory: Effort normal and breath sounds normal.  Dyspnea with exertion  GI: Soft. Bowel sounds are normal.  Musculoskeletal: Normal range of motion. He exhibits no edema.  Neurological: He is alert and oriented to person, place, and time.  Skin: Skin is warm and dry.  Psychiatric: He has a normal mood and affect. His behavior is normal. Judgment and thought content normal.    Encounter Medications:   Outpatient Encounter Medications as of 09/27/2017  Medication Sig  . acetaminophen (TYLENOL) 650 MG CR tablet Take 650 mg by mouth every 8 (eight) hours as needed for pain.  Marland Kitchen apixaban (ELIQUIS) 5 MG TABS tablet Take 1 tablet (5 mg total) by mouth 2 (two) times daily.  Marland Kitchen diltiazem (TIAZAC) 120 MG 24 hr capsule Take 120 mg by mouth every evening.   . diltiazem (TIAZAC) 300 MG 24 hr capsule Take 300 mg by mouth daily.   . flecainide (TAMBOCOR) 100 MG tablet Take 1.5 tablets (150 mg total) by mouth 2 (two) times daily.  . furosemide (LASIX) 40 MG tablet Take 1 tablet (40 mg total) by mouth 2 (two) times daily.  Marland Kitchen gabapentin (NEURONTIN) 300 MG capsule Take 600 mg by mouth 2 (two) times daily.   . hydrocortisone (ANUSOL-HC) 2.5 % rectal cream Place 1 application rectally as needed for hemorrhoids or  itching.   Marland Kitchen ketoconazole (NIZORAL) 2 % cream Apply 1 application topically daily.  . metoprolol tartrate (LOPRESSOR) 25 MG tablet Take 0.5 tablets (12.5 mg total) by mouth 2 (two) times daily.  Marland Kitchen morphine (MS CONTIN) 15 MG 12 hr tablet Take 15 mg by mouth every 12 (twelve) hours.  . pantoprazole (PROTONIX) 40 MG tablet Take 40 mg by mouth daily.  . polyethylene glycol (MIRALAX / GLYCOLAX) packet Take 17 g by mouth daily.  . psyllium (METAMUCIL) 58.6 % powder Take 1 packet by mouth daily.   . Tamsulosin HCl (FLOMAX) 0.4 MG CAPS Take 0.4 mg by mouth every other day.   . zinc oxide (BALMEX) 11.3 % CREA cream Apply 1 application topically 2 (two) times daily as needed.   Marland Kitchen CALCIUM-MAGNESIUM-VITAMIN D PO Take 1 tablet by mouth at bedtime.    No facility-administered encounter medications on file as of 09/27/2017.     Functional Status:   In your present state of health, do you have any difficulty performing the following activities: 08/30/2017 08/18/2017  Hearing? N N  Vision? N N  Difficulty concentrating or making decisions? Tempie Donning  Walking or climbing stairs? N N  Dressing or bathing? N N  Doing errands, shopping? N N  Preparing Food and eating ? N -  Using the Toilet? N -  In the past six months, have you accidently leaked urine? N -  Do you  have problems with loss of bowel control? N -  Managing your Medications? N -  Managing your Finances? N -  Some recent data might be hidden    Fall/Depression Screening:    Fall Risk  09/27/2017 08/30/2017 07/24/2017  Falls in the past year? Yes Yes Yes  Number falls in past yr: 1 1 1   Injury with Fall? No No Yes  Comment - - hurt head and wrist.    Risk for fall due to : History of fall(s);Medication side effect History of fall(s);Medication side effect -  Follow up Falls evaluation completed Falls evaluation completed;Education provided (No Data)  Comment - - fell jumping for a limb   PHQ 2/9 Scores 08/30/2017 08/17/2017 07/24/2017  PHQ - 2  Score 0 0 0    Assessment:  RN CM reviewed medications with pt, reviewed plan of care and discharge plan with pt and will see pt next month and plan to discharge, pt feels the reinforcement has helped.  THN CM Care Plan Problem One     Most Recent Value  Care Plan Problem One  Knowledge deficit related to CHF  Role Documenting the Problem One  Care Management Coordinator  Care Plan for Problem One  Active  THN Long Term Goal   pt will verbalize/ demonstrate improved self care for CHF to facilitate better outcomes within 60 days  THN Long Term Goal Start Date  08/22/17  Interventions for Problem One Long Term Goal  RN CM observed daily weights and encouraged pt for positive changes made  THN CM Short Term Goal #1   pt will verbalize CHF zones within 30 days  THN CM Short Term Goal #1 Start Date  08/22/17  Interventions for Short Term Goal #1  RN CM reinforced CHF zones and action plan    [facility] CM Care Plan Problem Two     Most Recent Value  Care Plan Problem Two  Difficulty with managing chronic pain  Role Documenting the Problem Two  Care Management Coordinator  Care Plan for Problem Two  Active  THN CM Short Term Goal #1   Pt will contact [facility] to discuss injections for back pain within 30 days  THN CM Short Term Goal #1 Start Date  09/30/17 [goal restarted]  Interventions for Short Term Goal #2   pt plans to discuss further with MD regarding pain/ injections.  Pt denies pain at present and varies day to day, Rn CM reviewed alternate techniques such as relaxation.      Plan: see pt for home visit next month  Jacqlyn Larsen [facility], [place] Coordinator [phone]

## 2017-10-01 DIAGNOSIS — I5033 Acute on chronic diastolic (congestive) heart failure: Secondary | ICD-10-CM | POA: Diagnosis not present

## 2017-10-01 DIAGNOSIS — N183 Chronic kidney disease, stage 3 (moderate): Secondary | ICD-10-CM | POA: Diagnosis not present

## 2017-10-02 ENCOUNTER — Telehealth: Payer: Self-pay | Admitting: Internal Medicine

## 2017-10-02 NOTE — Telephone Encounter (Signed)
Per phone call from pt--saw PCP today states he's having some pain in his chest, not currently having chest pain, please give pt a call @ [phone]

## 2017-10-02 NOTE — Telephone Encounter (Signed)
Patient thinks he has reflux.Ate bacon today which causes him to suffer. He says pain was when he lays down.When he is active, he feels no pain.He will call back if anything changes

## 2017-10-23 DIAGNOSIS — I5032 Chronic diastolic (congestive) heart failure: Secondary | ICD-10-CM | POA: Diagnosis not present

## 2017-10-30 ENCOUNTER — Encounter: Payer: Self-pay | Admitting: *Deleted

## 2017-10-30 ENCOUNTER — Other Ambulatory Visit: Payer: Self-pay | Admitting: *Deleted

## 2017-10-30 NOTE — Patient Outreach (Signed)
[facility]) Care Management   10/30/2017  [name]. 1941/07/08 [id number]  [name]. is an 77 y.o. male  Subjective: Routine home visit with pt, HIPAA verified, pt reports no changes in medications, pt states he has gained some weight "but I'm eating a lot more"  Pt reports he saw primary MD on 10/23/17 after calling MD for weight gain and following action plan, MD aware of weight, pt states cardiologist states base weight is 234 pounds and weight ranges 233-238 pounds.  Pt is taking extra diuretic on occasion as needed for weight gain and instructed by MD.  Objective:   Vitals:   10/30/17 1214  BP: 116/60  Pulse: 68  Resp: 18  Weight: 238 lb (108 kg)   ROS  Physical Exam  Constitutional: He is oriented to person, place, and time. He appears well-developed and well-nourished.  HENT:  Head: Normocephalic.  Neck: Normal range of motion. Neck supple.  Cardiovascular: Normal rate.  Respiratory: Effort normal and breath sounds normal.  GI: Soft. Bowel sounds are normal.  Musculoskeletal: Normal range of motion. He exhibits no edema.  Neurological: He is alert and oriented to person, place, and time.  Skin: Skin is warm and dry.  Psychiatric: He has a normal mood and affect. His behavior is normal. Thought content normal.    Encounter Medications:   Outpatient Encounter Medications as of 10/30/2017  Medication Sig  . acetaminophen (TYLENOL) 650 MG CR tablet Take 650 mg by mouth every 8 (eight) hours as needed for pain.  Marland Kitchen apixaban (ELIQUIS) 5 MG TABS tablet Take 1 tablet (5 mg total) by mouth 2 (two) times daily.  Marland Kitchen CALCIUM-MAGNESIUM-VITAMIN D PO Take 1 tablet by mouth at bedtime.   Marland Kitchen diltiazem (TIAZAC) 120 MG 24 hr capsule Take 120 mg by mouth every evening.   . diltiazem (TIAZAC) 300 MG 24 hr capsule Take 300 mg by mouth daily.   . flecainide (TAMBOCOR) 100 MG tablet Take 1.5 tablets (150 mg total) by mouth 2 (two) times daily.  .  furosemide (LASIX) 40 MG tablet Take 1 tablet (40 mg total) by mouth 2 (two) times daily.  Marland Kitchen gabapentin (NEURONTIN) 300 MG capsule Take 600 mg by mouth 2 (two) times daily.   . hydrocortisone (ANUSOL-HC) 2.5 % rectal cream Place 1 application rectally as needed for hemorrhoids or itching.   Marland Kitchen ketoconazole (NIZORAL) 2 % cream Apply 1 application topically daily.  . metoprolol tartrate (LOPRESSOR) 25 MG tablet Take 0.5 tablets (12.5 mg total) by mouth 2 (two) times daily.  Marland Kitchen morphine (MS CONTIN) 15 MG 12 hr tablet Take 15 mg by mouth every 12 (twelve) hours.  . pantoprazole (PROTONIX) 40 MG tablet Take 40 mg by mouth daily.  . polyethylene glycol (MIRALAX / GLYCOLAX) packet Take 17 g by mouth daily.  . psyllium (METAMUCIL) 58.6 % powder Take 1 packet by mouth daily.   . Tamsulosin HCl (FLOMAX) 0.4 MG CAPS Take 0.4 mg by mouth every other day.   . zinc oxide (BALMEX) 11.3 % CREA cream Apply 1 application topically 2 (two) times daily as needed.    No facility-administered encounter medications on file as of 10/30/2017.     Functional Status:   In your present state of health, do you have any difficulty performing the following activities: 08/30/2017 08/18/2017  Hearing? N N  Vision? N N  Difficulty concentrating or making decisions? Tempie Donning  Walking or climbing stairs? N N  Dressing or bathing?  N N  Doing errands, shopping? N N  Preparing Food and eating ? N -  Using the Toilet? N -  In the past six months, have you accidently leaked urine? N -  Do you have problems with loss of bowel control? N -  Managing your Medications? N -  Managing your Finances? N -  Some recent data might be hidden    Fall/Depression Screening:    Fall Risk  09/27/2017 08/30/2017 07/24/2017  Falls in the past year? Yes Yes Yes  Number falls in past yr: _0 Injury with Fall? No No Yes  Comment - - hurt head and wrist.    Risk for fall due to : History of fall(s);Medication side effect History of  fall(s);Medication side effect -  Follow up Falls evaluation completed Falls evaluation completed;Education provided (No Data)  Comment - - fell jumping for a limb   PHQ 2/9 Scores 08/30/2017 08/17/2017 07/24/2017  PHQ - 2 Score 0 0 0    Assessment:  RN CM reviewed medications with pt, reviewed weight log, gave positive reinforcement for following action plan and weighing daily.  Pt to see cardiologist at [facility] 11/26/17, primary MD at [facility] 11/15/17, follow up with Dr. Willey Blade 11/20/17.  THN CM Care Plan Problem One     Most Recent Value  Care Plan Problem One  Knowledge deficit related to CHF  Role Documenting the Problem One  Care Management Coordinator  Care Plan for Problem One  Active  THN Long Term Goal   pt will verbalize/ demonstrate improved self care for CHF to facilitate better outcomes within 60 days  THN Long Term Goal Start Date  08/22/17  [facility] Long Term Goal Met Date  10/30/17  Interventions for Problem One Long Term Goal  RN CM reviewed daily weights and encouraged pt for positive changes made  THN CM Short Term Goal #1   pt will verbalize CHF zones within 30 days  THN CM Short Term Goal #1 Start Date  10/24/16 [goal re-established]  Interventions for Short Term Goal #1  RN CM reinforced CHF zones and action plan with emphasis on yellow zone    [facility] CM Care Plan Problem Two     Most Recent Value  Care Plan Problem Two  Difficulty with managing chronic pain  Role Documenting the Problem Two  Care Management Coordinator  Care Plan for Problem Two  Active  THN CM Short Term Goal #1   Pt will contact  Pain Management to discuss injections for back pain within 30 days  THN CM Short Term Goal #1 Start Date  09/30/17 [goal restarted]  THN CM Short Term Goal #1 Met Date   10/30/17  Interventions for Short Term Goal #2   Pt states he discussed with pain management and has decided not to proceed forward with injections      Plan: see pt for home visit next month Assess  weight Reinforce CHF action plan  Jacqlyn Larsen [facility], [place] Coordinator [phone]

## 2017-11-20 DIAGNOSIS — I480 Paroxysmal atrial fibrillation: Secondary | ICD-10-CM | POA: Diagnosis not present

## 2017-11-20 DIAGNOSIS — I5032 Chronic diastolic (congestive) heart failure: Secondary | ICD-10-CM | POA: Diagnosis not present

## 2017-11-27 ENCOUNTER — Encounter: Payer: Self-pay | Admitting: *Deleted

## 2017-11-27 ENCOUNTER — Other Ambulatory Visit: Payer: Self-pay | Admitting: *Deleted

## 2017-11-27 NOTE — Patient Outreach (Signed)
[facility]) Care Management   11/27/2017  [name]. 10/20/1940 [id number]  [name]. is an 77 y.o. male  Subjective: Routine home visit with pt, HIPAA verified, pt reports he saw cardiologist at [facility] on 11/26/17 and pacemaker checked, no changes made, saw primary MD at [facility] on 11/15/17 and no changes made, pt to have echocardiogram on 11/29/17 at [facility].  Pt states he continues to weigh daily and record.   Objective:   Vitals:   11/27/17 1207  BP: 110/60  Pulse: 70  Resp: 16  SpO2: 96%  Weight: 237 lb 9.6 oz (107.8 kg)   ROS  Physical Exam  Constitutional: He is oriented to person, place, and time. He appears well-developed and well-nourished.  HENT:  Head: Normocephalic.  Neck: Normal range of motion. Neck supple.  Cardiovascular: Normal rate.  Respiratory: Effort normal and breath sounds normal.  GI: Soft. Bowel sounds are normal.  Musculoskeletal: Normal range of motion.  Neurological: He is alert and oriented to person, place, and time.  Skin: Skin is warm and dry.  Psychiatric: He has a normal mood and affect. His behavior is normal. Judgment and thought content normal.    Encounter Medications:   Outpatient Encounter Medications as of 11/27/2017  Medication Sig  . acetaminophen (TYLENOL) 650 MG CR tablet Take 650 mg by mouth every 8 (eight) hours as needed for pain.  Marland Kitchen apixaban (ELIQUIS) 5 MG TABS tablet Take 1 tablet (5 mg total) by mouth 2 (two) times daily.  Marland Kitchen diltiazem (TIAZAC) 120 MG 24 hr capsule Take 120 mg by mouth every evening.   . diltiazem (TIAZAC) 300 MG 24 hr capsule Take 300 mg by mouth daily.   . flecainide (TAMBOCOR) 100 MG tablet Take 1.5 tablets (150 mg total) by mouth 2 (two) times daily.  . furosemide (LASIX) 40 MG tablet Take 1 tablet (40 mg total) by mouth 2 (two) times daily. (Patient taking differently: Take 40 mg by mouth 2 (two) times daily. )  . gabapentin (NEURONTIN) 300 MG capsule Take 600 mg by mouth 2  (two) times daily.   . hydrocortisone (ANUSOL-HC) 2.5 % rectal cream Place 1 application rectally as needed for hemorrhoids or itching.   Marland Kitchen ketoconazole (NIZORAL) 2 % cream Apply 1 application topically daily.  . metoprolol tartrate (LOPRESSOR) 25 MG tablet Take 0.5 tablets (12.5 mg total) by mouth 2 (two) times daily.  Marland Kitchen morphine (MS CONTIN) 15 MG 12 hr tablet Take 15 mg by mouth every 12 (twelve) hours.  . pantoprazole (PROTONIX) 40 MG tablet Take 40 mg by mouth daily.  . polyethylene glycol (MIRALAX / GLYCOLAX) packet Take 17 g by mouth daily.  . psyllium (METAMUCIL) 58.6 % powder Take 1 packet by mouth daily.   . Tamsulosin HCl (FLOMAX) 0.4 MG CAPS Take 0.4 mg by mouth every other day.   . zinc oxide (BALMEX) 11.3 % CREA cream Apply 1 application topically 2 (two) times daily as needed.   Marland Kitchen CALCIUM-MAGNESIUM-VITAMIN D PO Take 1 tablet by mouth at bedtime.    No facility-administered encounter medications on file as of 11/27/2017.     Functional Status:   In your present state of health, do you have any difficulty performing the following activities: 08/30/2017 08/18/2017  Hearing? N N  Vision? N N  Difficulty concentrating or making decisions? Tempie Donning  Walking or climbing stairs? N N  Dressing or bathing? N N  Doing errands, shopping? N N  Conservation officer, nature and  eating ? N -  Using the Toilet? N -  In the past six months, have you accidently leaked urine? N -  Do you have problems with loss of bowel control? N -  Managing your Medications? N -  Managing your Finances? N -  Some recent data might be hidden    Fall/Depression Screening:    Fall Risk  09/27/2017 08/30/2017 07/24/2017  Falls in the past year? Yes Yes Yes  Number falls in past yr: _0 Injury with Fall? No No Yes  Comment - - hurt head and wrist.    Risk for fall due to : History of fall(s);Medication side effect History of fall(s);Medication side effect -  Follow up Falls evaluation completed Falls evaluation  completed;Education provided (No Data)  Comment - - fell jumping for a limb   PHQ 2/9 Scores 08/30/2017 08/17/2017 07/24/2017  PHQ - 2 Score 0 0 0    Assessment:  RN CM reviewed medications with pt, praised pt for his efforts with weighing daily and recording, attending all MD appointments, RN CM discussed discharge plans with pt and offered RN health coach services and pt declines, mailed case closure letter to patient's home, faxed case closure letter to primary MD Dr. Willey Blade.  THN CM Care Plan Problem One     Most Recent Value  Care Plan Problem One  Knowledge deficit related to CHF  Role Documenting the Problem One  Care Management [place] for Problem One  Active  THN Long Term Goal   pt will verbalize/ demonstrate improved self care for CHF to facilitate better outcomes within 60 days  THN Long Term Goal Start Date  08/22/17  [facility] Long Term Goal Met Date  10/30/17  Interventions for Problem One Long Term Goal  RN CM reviewed daily weights in [facility] calendar and encouraged pt for positive changes made  THN CM Short Term Goal #1   pt will verbalize CHF zones within 30 days  THN CM Short Term Goal #1 Start Date  10/24/16 [goal re-established]  THN CM Short Term Goal #1 Met Date  11/27/17  Interventions for Short Term Goal #1  RN CM reviewed CHF zones and action plan with emphasis on yellow zone    [facility] CM Care Plan Problem Two     Most Recent Value  Care Plan Problem Two  Difficulty with managing chronic pain  Role Documenting the Problem Two  Care Management [place] for Problem Two  Active  THN CM Short Term Goal #1   Pt will contact [place] [facility] to discuss injections for back pain within 30 days  THN CM Short Term Goal #1 Start Date  09/30/17 [goal restarted]  [facility] CM Short Term Goal #1 Met Date   10/30/17      Plan: discharge pt today  Jacqlyn Larsen [facility], BSN [place] Coordinator [phone]

## 2017-12-14 ENCOUNTER — Telehealth: Payer: Self-pay | Admitting: Internal Medicine

## 2017-12-14 NOTE — Telephone Encounter (Signed)
Went to lobby to discuss patient's concerns.  Patient c/o feeling tired.  No chest pain.  Dizziness - states he always has this, but no worse.  SOB - normal for him, no worse.  Stated that he has had a weight gain of about 10 pound in the last month, but his pmd Willey Blade) was aware of this.  Informed that I do not have a provider in house nor had he ever seen any of our routine cardiologist.  Only sees Dr. Lovena Le in [place] office for his device.  Advised he call his pmd as he was already aware of his situation.  If symptoms worsen, advised to seek ED. Patient verbalized understanding.

## 2017-12-14 NOTE — Telephone Encounter (Signed)
[name] walked into the [facility] office stating that he is not feeling well. States that he has a heavy fullness . States he takes 2 Lasix's per day.

## 2017-12-17 ENCOUNTER — Telehealth: Payer: Self-pay

## 2017-12-17 NOTE — Telephone Encounter (Signed)
Pt walked in office complaining that he had a 3 lb weight gain from Saturday to Sunday. He did say that he took an extra lasix and it came back down. He did admit to eating rather heavy on Saturday which he stated that it was probably the cause of his weight gain. He is taking 80 mg of lasix in the AM and 40 mg of lasix in the pm, which is different then his LOV stated. He also complains that he hears some crackling in his chest when he breathes. I advised him to go to see his pcp today . He stated he has an appointment @ 12 today but wanted to stop by here to see if we could see him as well. I informed pt we did not have any openings today and to be sure to keep the one with his pcp. He agreed with plan.

## 2017-12-20 DIAGNOSIS — Z961 Presence of intraocular lens: Secondary | ICD-10-CM | POA: Diagnosis not present

## 2017-12-20 DIAGNOSIS — H43813 Vitreous degeneration, bilateral: Secondary | ICD-10-CM | POA: Diagnosis not present

## 2017-12-20 DIAGNOSIS — H00025 Hordeolum internum left lower eyelid: Secondary | ICD-10-CM | POA: Diagnosis not present

## 2017-12-20 DIAGNOSIS — Z9849 Cataract extraction status, unspecified eye: Secondary | ICD-10-CM | POA: Diagnosis not present

## 2018-01-02 DIAGNOSIS — H52223 Regular astigmatism, bilateral: Secondary | ICD-10-CM | POA: Diagnosis not present

## 2018-01-02 DIAGNOSIS — H524 Presbyopia: Secondary | ICD-10-CM | POA: Diagnosis not present

## 2018-01-02 DIAGNOSIS — H5202 Hypermetropia, left eye: Secondary | ICD-10-CM | POA: Diagnosis not present

## 2018-01-02 DIAGNOSIS — Z961 Presence of intraocular lens: Secondary | ICD-10-CM | POA: Diagnosis not present

## 2018-01-09 DIAGNOSIS — I480 Paroxysmal atrial fibrillation: Secondary | ICD-10-CM | POA: Diagnosis not present

## 2018-01-09 DIAGNOSIS — Z79899 Other long term (current) drug therapy: Secondary | ICD-10-CM | POA: Diagnosis not present

## 2018-01-09 DIAGNOSIS — I5032 Chronic diastolic (congestive) heart failure: Secondary | ICD-10-CM | POA: Diagnosis not present

## 2018-01-16 DIAGNOSIS — N183 Chronic kidney disease, stage 3 (moderate): Secondary | ICD-10-CM | POA: Diagnosis not present

## 2018-01-16 DIAGNOSIS — I5032 Chronic diastolic (congestive) heart failure: Secondary | ICD-10-CM | POA: Diagnosis not present

## 2018-01-16 DIAGNOSIS — I480 Paroxysmal atrial fibrillation: Secondary | ICD-10-CM | POA: Diagnosis not present

## 2018-01-25 DIAGNOSIS — I5032 Chronic diastolic (congestive) heart failure: Secondary | ICD-10-CM | POA: Diagnosis not present

## 2018-02-01 DIAGNOSIS — I5032 Chronic diastolic (congestive) heart failure: Secondary | ICD-10-CM | POA: Diagnosis not present

## 2018-02-15 ENCOUNTER — Telehealth: Payer: Self-pay | Admitting: *Deleted

## 2018-02-15 NOTE — Telephone Encounter (Signed)
Spoke with patient to move 03/14/18 [facility] appt to 3:00pm.  Patient is agreeable to this change.  Will mail updated appt calendar per his request.

## 2018-03-14 ENCOUNTER — Ambulatory Visit (INDEPENDENT_AMBULATORY_CARE_PROVIDER_SITE_OTHER): Payer: Medicare Other | Admitting: *Deleted

## 2018-03-14 DIAGNOSIS — Z950 Presence of cardiac pacemaker: Secondary | ICD-10-CM

## 2018-03-14 DIAGNOSIS — I495 Sick sinus syndrome: Secondary | ICD-10-CM

## 2018-03-14 LAB — CUP PACEART INCLINIC DEVICE CHECK
Battery Impedance: 6300 Ohm
Battery Voltage: 2.68 V
Brady Statistic RA Percent Paced: 99 %
Brady Statistic RV Percent Paced: 99 %
Implantable Lead Location: 753859
Implantable Lead Location: 753860
Lead Channel Pacing Threshold Amplitude: 1 V
Lead Channel Pacing Threshold Amplitude: 1.25 V
Lead Channel Pacing Threshold Pulse Width: 0.5 ms
Lead Channel Setting Pacing Amplitude: 2.5 V
Lead Channel Setting Pacing Pulse Width: 0.4 ms
MDC IDC LEAD IMPLANT DT: 20090903
MDC IDC LEAD IMPLANT DT: 20090903
MDC IDC MSMT LEADCHNL RA IMPEDANCE VALUE: 400 Ohm
MDC IDC MSMT LEADCHNL RV IMPEDANCE VALUE: 507 Ohm
MDC IDC MSMT LEADCHNL RV PACING THRESHOLD PULSEWIDTH: 0.4 ms
MDC IDC MSMT LEADCHNL RV SENSING INTR AMPL: 7.2 mV
MDC IDC PG IMPLANT DT: 20090903
MDC IDC PG SERIAL: 1269181
MDC IDC SESS DTM: 20190627152855
MDC IDC SET LEADCHNL RA PACING AMPLITUDE: 2.5 V
MDC IDC SET LEADCHNL RV SENSING SENSITIVITY: 2 mV

## 2018-03-14 NOTE — Progress Notes (Signed)
Pacemaker check in clinic. Normal device function. Thresholds, sensing, and impedances consistent with previous measurements. Device programmed to maximize longevity. 30% mode switched, +Eliquis and flecainide, peak A 549bpm, longest 2hr 49min. No episode triggers enabled. Device programmed at appropriate safety margins. Histogram distribution appropriate for patient activity level. Device programmed to optimize intrinsic conduction. Estimated longevity 1.25-1.5 years. Patient education completed. ROV with GT/R in 08/2018.

## 2018-03-28 DIAGNOSIS — N183 Chronic kidney disease, stage 3 (moderate): Secondary | ICD-10-CM | POA: Diagnosis not present

## 2018-03-28 DIAGNOSIS — I480 Paroxysmal atrial fibrillation: Secondary | ICD-10-CM | POA: Diagnosis not present

## 2018-03-28 DIAGNOSIS — I5032 Chronic diastolic (congestive) heart failure: Secondary | ICD-10-CM | POA: Diagnosis not present

## 2018-04-04 DIAGNOSIS — I480 Paroxysmal atrial fibrillation: Secondary | ICD-10-CM | POA: Diagnosis not present

## 2018-04-04 DIAGNOSIS — I5032 Chronic diastolic (congestive) heart failure: Secondary | ICD-10-CM | POA: Diagnosis not present

## 2018-04-24 ENCOUNTER — Telehealth: Payer: Self-pay | Admitting: Internal Medicine

## 2018-04-24 NOTE — Telephone Encounter (Signed)
Pt has an older dual chamber pacemaker that does not transmit remotely and does not have capabilities to monitor fluid level, will forward to triage for further recommendations.

## 2018-04-24 NOTE — Telephone Encounter (Signed)
Spoke with patient and he informed nurse that after taking his furosemide dosage this morning of 80 mg, his weight decreased 4 lbs between 6:30 am and 10:30 am. Patient said sob improved with weight loss. Admits to eating a heavier meal on last night including a BIG burger and french fries. No c/o chest pain, swelling,  palpitations or fast heart rate or increased dizziness. Patient said he has a h/o of being dizzy when changing positions. Patient said that he went to the [place] office so that he could get someone to listen to his lungs and heart to make sure he didn't have fluid in his lungs and that his heart wasn't out of rhythm. Patient said he feels better now. Patient advised to limit sodium in his diet, continue daily weights, same scale, same time and same amount of clothes. Patient advise to contact our office back if his symptoms return or go to the ED if they get worse. Verbalized understanding of plan.

## 2018-04-24 NOTE — Telephone Encounter (Signed)
Pt says for the last 2 days has been more SOB and has gained 3lbs since yesterday with swelling in feet/legs - also c/o weakness - pt doesn't have pcp cardiologist and only sees Dr Lovena Le - pt wanting to come in for device check and nurse visit to check on rhythm and fluid - will forward to device clinic

## 2018-04-24 NOTE — Telephone Encounter (Signed)
Patient called wanting to know if he could come by [facility] location (currently in [place]) and get his heart and lungs checked.   He has been very weak and thinks he has gained fluid over the past few weeks.

## 2018-05-23 DIAGNOSIS — I4891 Unspecified atrial fibrillation: Secondary | ICD-10-CM | POA: Diagnosis not present

## 2018-05-24 DIAGNOSIS — J019 Acute sinusitis, unspecified: Secondary | ICD-10-CM | POA: Diagnosis not present

## 2018-05-24 DIAGNOSIS — I5032 Chronic diastolic (congestive) heart failure: Secondary | ICD-10-CM | POA: Diagnosis not present

## 2018-07-02 ENCOUNTER — Ambulatory Visit: Payer: Medicare Other | Admitting: Urology

## 2018-07-02 DIAGNOSIS — N401 Enlarged prostate with lower urinary tract symptoms: Secondary | ICD-10-CM

## 2018-07-02 DIAGNOSIS — R351 Nocturia: Secondary | ICD-10-CM | POA: Diagnosis not present

## 2018-07-02 DIAGNOSIS — B356 Tinea cruris: Secondary | ICD-10-CM | POA: Diagnosis not present

## 2018-08-02 DIAGNOSIS — I5032 Chronic diastolic (congestive) heart failure: Secondary | ICD-10-CM | POA: Diagnosis not present

## 2018-08-09 DIAGNOSIS — I5032 Chronic diastolic (congestive) heart failure: Secondary | ICD-10-CM | POA: Diagnosis not present

## 2018-08-09 DIAGNOSIS — I480 Paroxysmal atrial fibrillation: Secondary | ICD-10-CM | POA: Diagnosis not present

## 2018-08-30 IMAGING — CR DG CHEST 1V PORT
1 series · 1 of 1 positions shown · non-contrast
Comparison: 08/08/2017

CLINICAL DATA: Shortness of breath for 3 days.

EXAM:
PORTABLE CHEST 1 VIEW

[portable]
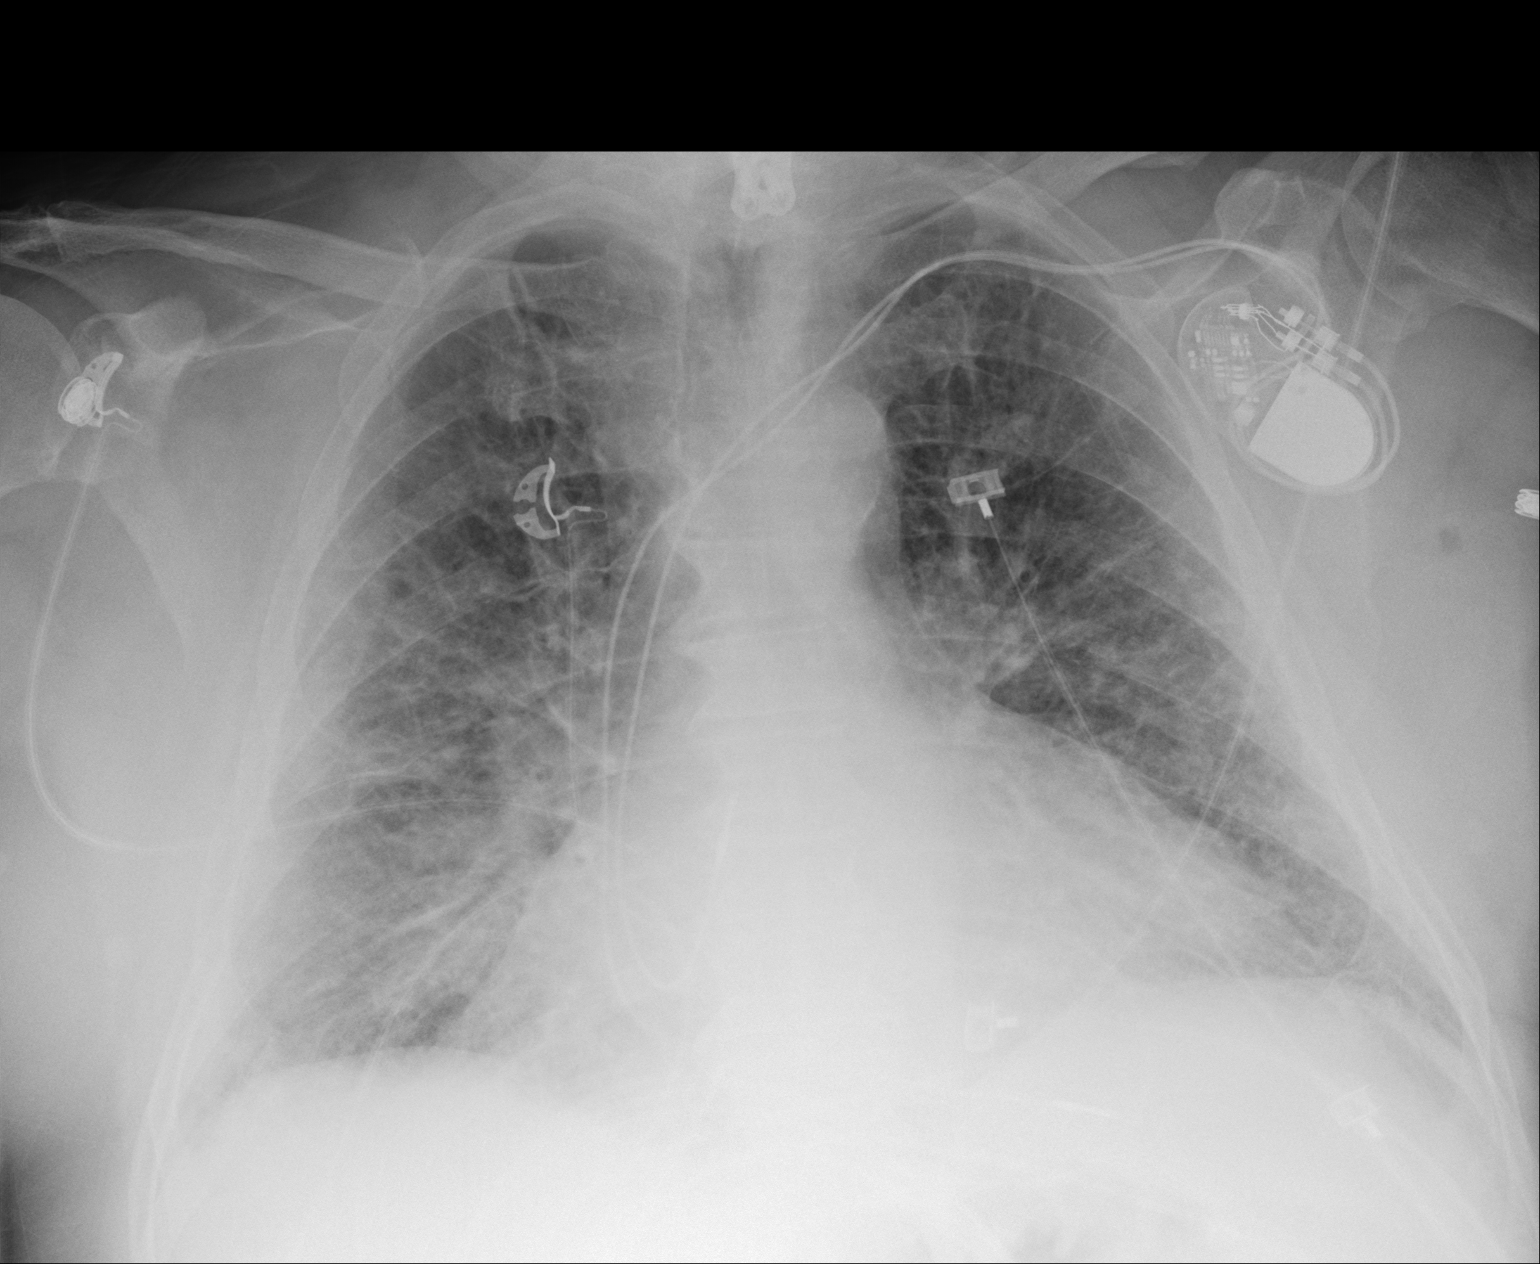

[1 of 1 positions shown; findings below may reference images not displayed]

FINDINGS: Left-sided pacemaker remains in place. Cardiomegaly with increased
from prior. Development of pulmonary edema from prior. Possible
small pleural effusions. No confluent airspace disease. No
pneumothorax.
IMPRESSION: CHF.

## 2018-10-03 DIAGNOSIS — Z79899 Other long term (current) drug therapy: Secondary | ICD-10-CM | POA: Diagnosis not present

## 2018-10-03 DIAGNOSIS — I480 Paroxysmal atrial fibrillation: Secondary | ICD-10-CM | POA: Diagnosis not present

## 2018-10-03 DIAGNOSIS — I5032 Chronic diastolic (congestive) heart failure: Secondary | ICD-10-CM | POA: Diagnosis not present

## 2018-10-11 DIAGNOSIS — I495 Sick sinus syndrome: Secondary | ICD-10-CM | POA: Diagnosis not present

## 2018-10-11 DIAGNOSIS — I5032 Chronic diastolic (congestive) heart failure: Secondary | ICD-10-CM | POA: Diagnosis not present

## 2018-10-14 ENCOUNTER — Encounter: Payer: Self-pay | Admitting: Internal Medicine

## 2018-10-14 ENCOUNTER — Encounter: Payer: Self-pay | Admitting: *Deleted

## 2018-10-14 ENCOUNTER — Ambulatory Visit: Payer: Medicare Other | Admitting: Internal Medicine

## 2018-10-14 VITALS — BP 117/68 | HR 70 | Ht 68.0 in | Wt 255.0 lb

## 2018-10-14 DIAGNOSIS — Z950 Presence of cardiac pacemaker: Secondary | ICD-10-CM | POA: Diagnosis not present

## 2018-10-14 DIAGNOSIS — I495 Sick sinus syndrome: Secondary | ICD-10-CM

## 2018-10-14 DIAGNOSIS — I480 Paroxysmal atrial fibrillation: Secondary | ICD-10-CM | POA: Diagnosis not present

## 2018-10-14 LAB — CUP PACEART INCLINIC DEVICE CHECK
Battery Impedance: 19100 Ohm
Battery Voltage: 2.6 V
Brady Statistic RV Percent Paced: 99 %
Implantable Lead Implant Date: 20090903
Implantable Lead Location: 753860
Lead Channel Impedance Value: 557 Ohm
Lead Channel Sensing Intrinsic Amplitude: 1.6 mV
Lead Channel Setting Pacing Amplitude: 3.5 V
Lead Channel Setting Pacing Pulse Width: 1 ms
MDC IDC LEAD IMPLANT DT: 20090903
MDC IDC LEAD LOCATION: 753859
MDC IDC MSMT BATTERY REMAINING LONGEVITY: 0 mo
MDC IDC MSMT LEADCHNL RA IMPEDANCE VALUE: 419 Ohm
MDC IDC MSMT LEADCHNL RV PACING THRESHOLD AMPLITUDE: 1.25 V
MDC IDC MSMT LEADCHNL RV PACING THRESHOLD PULSEWIDTH: 0.4 ms
MDC IDC MSMT LEADCHNL RV SENSING INTR AMPL: 8.2 mV
MDC IDC PG IMPLANT DT: 20090903
MDC IDC PG SERIAL: 1269181
MDC IDC SESS DTM: 20200127110002
MDC IDC SET LEADCHNL RA PACING AMPLITUDE: 3.5 V
MDC IDC SET LEADCHNL RV SENSING SENSITIVITY: 2 mV
MDC IDC STAT BRADY RA PERCENT PACED: 99 % — AB

## 2018-10-14 NOTE — H&P (View-Only) (Signed)
HPI [name] returns today for ongoing evaluation and management of diastolic heart failure, paroxysmal atrial arrhythmias, obesity, and complete heart block status post pacemaker insertion.  He has a history of paroxysmal/persistent atrial fibrillation and flutter as well as sinus node dysfunction. He is improved but notes that his weight has trended higher.  He does admit to dietary indiscretion.  He does not have palpitations. Allergies  Allergen Reactions  . Bee Venom Anaphylaxis and Swelling  . Shellfish Allergy Hives    Accelerated allergic reaction; questionable laryngospasm or laryngeal edema with some difficulty breathing prompting ED evaluation and treatment at [facility]  . Penicillins Rash    Has patient had a PCN reaction causing immediate rash, facial/tongue/throat swelling, SOB or lightheadedness with hypotension: No Has patient had a PCN reaction causing severe rash involving mucus membranes or skin necrosis: No Has patient had a PCN reaction that required hospitalization No Has patient had a PCN reaction occurring within the last 10 years: No If all of the above answers are "NO", then may proceed with Cephalosporin use.      Current Outpatient Medications  Medication Sig Dispense Refill  . acetaminophen (TYLENOL) 650 MG CR tablet Take 650 mg by mouth every 8 (eight) hours as needed for pain.    Marland Kitchen apixaban (ELIQUIS) 5 MG TABS tablet Take 1 tablet (5 mg total) by mouth 2 (two) times daily. 60 tablet 1  . CALCIUM-MAGNESIUM-VITAMIN D PO Take 1 tablet by mouth at bedtime.     Marland Kitchen diltiazem (TIAZAC) 120 MG 24 hr capsule Take 120 mg by mouth every evening.     . diltiazem (TIAZAC) 300 MG 24 hr capsule Take 300 mg by mouth daily.     . flecainide (TAMBOCOR) 100 MG tablet Take 1.5 tablets (150 mg total) by mouth 2 (two) times daily. 270 tablet 3  . furosemide (LASIX) 40 MG tablet Take 1 tablet (40 mg total) by mouth 2 (two) times daily. (Patient taking differently: Take 40  mg by mouth 2 (two) times daily. ) 60 tablet 0  . gabapentin (NEURONTIN) 300 MG capsule Take 1,200 mg by mouth 2 (two) times daily.     . hydrocortisone (ANUSOL-HC) 2.5 % rectal cream Place 1 application rectally as needed for hemorrhoids or itching.     Marland Kitchen ketoconazole (NIZORAL) 2 % cream Apply 1 application topically daily.    . metoprolol tartrate (LOPRESSOR) 25 MG tablet Take 0.5 tablets (12.5 mg total) by mouth 2 (two) times daily. 90 tablet 3  . morphine (MS CONTIN) 15 MG 12 hr tablet Take 15 mg by mouth every other day.     . pantoprazole (PROTONIX) 40 MG tablet Take 40 mg by mouth daily.    . polyethylene glycol (MIRALAX / GLYCOLAX) packet Take 17 g by mouth daily as needed.     . psyllium (METAMUCIL) 58.6 % powder Take 1 packet by mouth daily.     . Tamsulosin HCl (FLOMAX) 0.4 MG CAPS Take 0.4 mg by mouth every other day.     . zinc oxide (BALMEX) 11.3 % CREA cream Apply 1 application topically 2 (two) times daily as needed.      No current facility-administered medications for this visit.      Past Medical History:  Diagnosis Date  . Atrial flutter ([place])    a. s/p ablation 01/2015.  Marland Kitchen BPH (benign prostatic hyperplasia)   . Cerebrovascular disease    carotid bruit; no focal stenosis by carotid ultrasound in 05/2008  .  Congestive heart failure (CHF) ([place])   . Dysrhythmia   . General weakness    +malaise, exercise intolerance, and near syncope  . Hypertension   . Low back pain    history of traumax2  . Paroxysmal atrial fibrillation (HCC)    on Eliquis  . Presence of permanent cardiac pacemaker 2009  . Sleep apnea    c pap at night  . Tachy-brady syndrome ([place])    a. s/p St. Jude pacemaker 2009.    ROS:   All systems reviewed and negative except as noted in the HPI.   Past Surgical History:  Procedure Laterality Date  . ABLATION    . CATARACT EXTRACTION     Bilateral  . CERVICAL DISCECTOMY  2006  . COLONOSCOPY WITH PROPOFOL N/A 01/26/2017   Procedure: COLONOSCOPY  WITH PROPOFOL;  Surgeon: [name], MD;  [facility];  Service: Endoscopy;  Laterality: N/A;  8:30  . ELECTROPHYSIOLOGIC STUDY N/A 01/18/2015   Procedure: A-Flutter/A-Tach/Svt Ablation;  Surgeon: [name], MD;  Location: [facility];  Service: Cardiovascular;  Laterality: N/A;  . KNEE ARTHROSCOPY WITH MEDIAL MENISECTOMY Left 06/03/2015   Procedure: KNEE ARTHROSCOPY WITH MEDIAL MENISECTOMY;  Surgeon: [name], MD;  Location: [facility];  Service: Orthopedics;  Laterality: Left;  . PACEMAKER INSERTION  1966  . POLYPECTOMY  01/26/2017   Procedure: POLYPECTOMY;  Surgeon: [name], MD;  [facility];  Service: Endoscopy;;  colon     Family History  Problem Relation Age of Onset  . Hypertension Other   . Neuropathy Neg Hx   . Stroke Neg Hx      Social History   Socioeconomic History  . Marital status: Divorced    Spouse name: Not on file  . Number of children: 3  . Years of education: 12+  . Highest education level: Not on file  Occupational History  . Occupation: Retired  Scientific laboratory technician  . Financial resource strain: Not on file  . Food insecurity:    Worry: Not on file    Inability: Not on file  . Transportation needs:    Medical: Not on file    Non-medical: Not on file  Tobacco Use  . Smoking status: Former Smoker    Packs/day: 1.00    Years: 0.50    Pack years: 0.50    Types: Cigarettes    Last attempt to quit: 03/27/1962    Years since quitting: 56.5  . Smokeless tobacco: Never Used  Substance and Sexual Activity  . Alcohol use: No    Alcohol/week: 0.0 standard drinks  . Drug use: No  . Sexual activity: Not Currently  Lifestyle  . Physical activity:    Days per week: Not on file    Minutes per session: Not on file  . Stress: Not on file  Relationships  . Social connections:    Talks on phone: Not on file    Gets together: Not on file    Attends religious service: Not on file    Active member of club or  organization: Not on file    Attends meetings of clubs or organizations: Not on file    Relationship status: Not on file  . Intimate partner violence:    Fear of current or ex partner: Not on file    Emotionally abused: Not on file    Physically abused: Not on file    Forced sexual activity: Not on file  Other Topics Concern  .  Not on file  Social History Narrative   Divorced with 3 adult children. Semi retired.    Caffeine use: once daily     BP 117/68   Pulse 70   Ht 5\' 8"  (1.727 m)   Wt 255 lb (115.7 kg)   SpO2 94%   BMI 38.77 kg/m   Physical Exam:  Well appearing NAD HEENT: Unremarkable Neck:  No JVD, no thyromegally Lymphatics:  No adenopathy Back:  No CVA tenderness Lungs:  Clear HEART:  Regular rate rhythm, no murmurs, no rubs, no clicks Abd:  soft, positive bowel sounds, no organomegally, no rebound, no guarding Ext:  2 plus pulses, no edema, no cyanosis, no clubbing Skin:  No rashes no nodules Neuro:  CN II through XII intact, motor grossly intact   DEVICE  Normal device function.  See PaceArt for details. Impending ERI.  Assess/Plan: 1. Sinus node dysfunction - he is s/p PPM.  2. PAF - he has both atrial fib and flutter which is paroxysmal. He is out of rhythm today.  3. PPM - his St. Jude DDD PPM is impending ERI. As he cannot get down to [facility] easily and cannot be checked over the phone, we will schedule him a PPM gen change today, expecting he has reached ERI in 4 weeks. Outputs have been increased. 4. Obesity - I discussed his strategy for losing weight. We will follow.  Mikle Bosworth.D.

## 2018-10-14 NOTE — Progress Notes (Signed)
HPI [name] returns today for ongoing evaluation and management of diastolic heart failure, paroxysmal atrial arrhythmias, obesity, and complete heart block status post pacemaker insertion.  He has a history of paroxysmal/persistent atrial fibrillation and flutter as well as sinus node dysfunction. He is improved but notes that his weight has trended higher.  He does admit to dietary indiscretion.  He does not have palpitations. Allergies  Allergen Reactions  . Bee Venom Anaphylaxis and Swelling  . Shellfish Allergy Hives    Accelerated allergic reaction; questionable laryngospasm or laryngeal edema with some difficulty breathing prompting ED evaluation and treatment at [facility]  . Penicillins Rash    Has patient had a PCN reaction causing immediate rash, facial/tongue/throat swelling, SOB or lightheadedness with hypotension: No Has patient had a PCN reaction causing severe rash involving mucus membranes or skin necrosis: No Has patient had a PCN reaction that required hospitalization No Has patient had a PCN reaction occurring within the last 10 years: No If all of the above answers are "NO", then may proceed with Cephalosporin use.      Current Outpatient Medications  Medication Sig Dispense Refill  . acetaminophen (TYLENOL) 650 MG CR tablet Take 650 mg by mouth every 8 (eight) hours as needed for pain.    Marland Kitchen apixaban (ELIQUIS) 5 MG TABS tablet Take 1 tablet (5 mg total) by mouth 2 (two) times daily. 60 tablet 1  . CALCIUM-MAGNESIUM-VITAMIN D PO Take 1 tablet by mouth at bedtime.     Marland Kitchen diltiazem (TIAZAC) 120 MG 24 hr capsule Take 120 mg by mouth every evening.     . diltiazem (TIAZAC) 300 MG 24 hr capsule Take 300 mg by mouth daily.     . flecainide (TAMBOCOR) 100 MG tablet Take 1.5 tablets (150 mg total) by mouth 2 (two) times daily. 270 tablet 3  . furosemide (LASIX) 40 MG tablet Take 1 tablet (40 mg total) by mouth 2 (two) times daily. (Patient taking differently: Take 40  mg by mouth 2 (two) times daily. ) 60 tablet 0  . gabapentin (NEURONTIN) 300 MG capsule Take 1,200 mg by mouth 2 (two) times daily.     . hydrocortisone (ANUSOL-HC) 2.5 % rectal cream Place 1 application rectally as needed for hemorrhoids or itching.     Marland Kitchen ketoconazole (NIZORAL) 2 % cream Apply 1 application topically daily.    . metoprolol tartrate (LOPRESSOR) 25 MG tablet Take 0.5 tablets (12.5 mg total) by mouth 2 (two) times daily. 90 tablet 3  . morphine (MS CONTIN) 15 MG 12 hr tablet Take 15 mg by mouth every other day.     . pantoprazole (PROTONIX) 40 MG tablet Take 40 mg by mouth daily.    . polyethylene glycol (MIRALAX / GLYCOLAX) packet Take 17 g by mouth daily as needed.     . psyllium (METAMUCIL) 58.6 % powder Take 1 packet by mouth daily.     . Tamsulosin HCl (FLOMAX) 0.4 MG CAPS Take 0.4 mg by mouth every other day.     . zinc oxide (BALMEX) 11.3 % CREA cream Apply 1 application topically 2 (two) times daily as needed.      No current facility-administered medications for this visit.      Past Medical History:  Diagnosis Date  . Atrial flutter ([place])    a. s/p ablation 01/2015.  Marland Kitchen BPH (benign prostatic hyperplasia)   . Cerebrovascular disease    carotid bruit; no focal stenosis by carotid ultrasound in 05/2008  .  Congestive heart failure (CHF) ([place])   . Dysrhythmia   . General weakness    +malaise, exercise intolerance, and near syncope  . Hypertension   . Low back pain    history of traumax2  . Paroxysmal atrial fibrillation (HCC)    on Eliquis  . Presence of permanent cardiac pacemaker 2009  . Sleep apnea    c pap at night  . Tachy-brady syndrome ([place])    a. s/p St. Jude pacemaker 2009.    ROS:   All systems reviewed and negative except as noted in the HPI.   Past Surgical History:  Procedure Laterality Date  . ABLATION    . CATARACT EXTRACTION     Bilateral  . CERVICAL DISCECTOMY  2006  . COLONOSCOPY WITH PROPOFOL N/A 01/26/2017   Procedure: COLONOSCOPY  WITH PROPOFOL;  Surgeon: [name], MD;  [facility];  Service: Endoscopy;  Laterality: N/A;  8:30  . ELECTROPHYSIOLOGIC STUDY N/A 01/18/2015   Procedure: A-Flutter/A-Tach/Svt Ablation;  Surgeon: [name], MD;  Location: [facility];  Service: Cardiovascular;  Laterality: N/A;  . KNEE ARTHROSCOPY WITH MEDIAL MENISECTOMY Left 06/03/2015   Procedure: KNEE ARTHROSCOPY WITH MEDIAL MENISECTOMY;  Surgeon: [name], MD;  Location: [facility];  Service: Orthopedics;  Laterality: Left;  . PACEMAKER INSERTION  1966  . POLYPECTOMY  01/26/2017   Procedure: POLYPECTOMY;  Surgeon: [name], MD;  [facility];  Service: Endoscopy;;  colon     Family History  Problem Relation Age of Onset  . Hypertension Other   . Neuropathy Neg Hx   . Stroke Neg Hx      Social History   Socioeconomic History  . Marital status: Divorced    Spouse name: Not on file  . Number of children: 3  . Years of education: 12+  . Highest education level: Not on file  Occupational History  . Occupation: Retired  Scientific laboratory technician  . Financial resource strain: Not on file  . Food insecurity:    Worry: Not on file    Inability: Not on file  . Transportation needs:    Medical: Not on file    Non-medical: Not on file  Tobacco Use  . Smoking status: Former Smoker    Packs/day: 1.00    Years: 0.50    Pack years: 0.50    Types: Cigarettes    Last attempt to quit: 03/27/1962    Years since quitting: 56.5  . Smokeless tobacco: Never Used  Substance and Sexual Activity  . Alcohol use: No    Alcohol/week: 0.0 standard drinks  . Drug use: No  . Sexual activity: Not Currently  Lifestyle  . Physical activity:    Days per week: Not on file    Minutes per session: Not on file  . Stress: Not on file  Relationships  . Social connections:    Talks on phone: Not on file    Gets together: Not on file    Attends religious service: Not on file    Active member of club or  organization: Not on file    Attends meetings of clubs or organizations: Not on file    Relationship status: Not on file  . Intimate partner violence:    Fear of current or ex partner: Not on file    Emotionally abused: Not on file    Physically abused: Not on file    Forced sexual activity: Not on file  Other Topics Concern  .  Not on file  Social History Narrative   Divorced with 3 adult children. Semi retired.    Caffeine use: once daily     BP 117/68   Pulse 70   Ht 5\' 8"  (1.727 m)   Wt 255 lb (115.7 kg)   SpO2 94%   BMI 38.77 kg/m   Physical Exam:  Well appearing NAD HEENT: Unremarkable Neck:  No JVD, no thyromegally Lymphatics:  No adenopathy Back:  No CVA tenderness Lungs:  Clear HEART:  Regular rate rhythm, no murmurs, no rubs, no clicks Abd:  soft, positive bowel sounds, no organomegally, no rebound, no guarding Ext:  2 plus pulses, no edema, no cyanosis, no clubbing Skin:  No rashes no nodules Neuro:  CN II through XII intact, motor grossly intact   DEVICE  Normal device function.  See PaceArt for details. Impending ERI.  Assess/Plan: 1. Sinus node dysfunction - he is s/p PPM.  2. PAF - he has both atrial fib and flutter which is paroxysmal. He is out of rhythm today.  3. PPM - his St. Jude DDD PPM is impending ERI. As he cannot get down to [facility] easily and cannot be checked over the phone, we will schedule him a PPM gen change today, expecting he has reached ERI in 4 weeks. Outputs have been increased. 4. Obesity - I discussed his strategy for losing weight. We will follow.  Mikle Bosworth.D.

## 2018-10-14 NOTE — Patient Instructions (Signed)
Medication Instructions:  Your physician recommends that you continue on your current medications as directed. Please refer to the Current Medication list given to you today.  If you need a refill on your cardiac medications before your next appointment, please call your pharmacy.   Lab work: Your physician recommends that you return for lab work in: 11/04/2018   If you have labs (blood work) drawn today and your tests are completely normal, you will receive your results only by: Marland Kitchen MyChart Message (if you have MyChart) OR . A paper copy in the mail If you have any lab test that is abnormal or we need to change your treatment, we will call you to review the results.  Testing/Procedures: NONE   Follow-Up: At [facility], you and your health needs are our priority.  As part of our continuing mission to provide you with exceptional heart care, we have created designated Provider Care Teams.  These Care Teams include your primary Cardiologist (physician) and Advanced Practice Providers (APPs -  Physician Assistants and Nurse Practitioners) who all work together to provide you with the care you need, when you need it. You will need a follow up appointment after Pacemaker gen change .  Please call our office 2 months in advance to schedule this appointment.  You may see None or one of the following Advanced Practice Providers on your designated Care Team:   Chanetta Marshall, NP . Tommye Standard, PA-C .   Any Other Special Instructions Will Be Listed Below (If Applicable).

## 2018-10-16 ENCOUNTER — Telehealth: Payer: Self-pay | Admitting: Internal Medicine

## 2018-10-16 NOTE — Telephone Encounter (Signed)
Patient states that HR has been in 70's for years and after OV on 1/27 it is now in 50. Patient would like return phone call./ tg

## 2018-10-16 NOTE — Telephone Encounter (Signed)
See phone note from 904 am   Patient has walked into [facility] stating that he is very weak and dizzy. Wanted to see if someone can check him.

## 2018-10-16 NOTE — Telephone Encounter (Signed)
Pt was recently seen Monday with Dr Lovena Le (gen change scheduled 2/24) - walked into office today concerned about HR say he took it at home was 75 - says he usually runs in the 70s - denies dizziness/CP/SOB - no new/worsening symptoms - pt requesting we check BP/HR and send this to device triage - BP 108/58 HR 63

## 2018-10-16 NOTE — Telephone Encounter (Signed)
Spoke with pt informed him that it is likely his device has reached ERI which is like the gas light coming on in a car. Informed pt that once the device reached this the device would enable battery conserving measures which entails rate response would turn off as well as the pacing rate is lowered to 60bpm which would explain the reason why his heart rate has been 62-65, pt voiced understanding.

## 2018-10-22 DIAGNOSIS — R05 Cough: Secondary | ICD-10-CM | POA: Diagnosis not present

## 2018-10-22 DIAGNOSIS — I495 Sick sinus syndrome: Secondary | ICD-10-CM | POA: Diagnosis not present

## 2018-10-28 ENCOUNTER — Other Ambulatory Visit: Payer: Self-pay | Admitting: Internal Medicine

## 2018-11-04 ENCOUNTER — Other Ambulatory Visit (HOSPITAL_COMMUNITY)
Admission: RE | Admit: 2018-11-04 | Discharge: 2018-11-04 | Disposition: A | Discharge status: Home / Self Care | Payer: Medicare Other | Source: Ambulatory Visit | Attending: Internal Medicine | Admitting: Internal Medicine

## 2018-11-04 DIAGNOSIS — I480 Paroxysmal atrial fibrillation: Secondary | ICD-10-CM | POA: Diagnosis not present

## 2018-11-04 DIAGNOSIS — I495 Sick sinus syndrome: Secondary | ICD-10-CM | POA: Diagnosis not present

## 2018-11-04 LAB — CBC WITH DIFFERENTIAL/PLATELET
Abs Immature Granulocytes: 0.02 10*3/uL (ref 0.00–0.07)
BASOS ABS: 0.1 10*3/uL (ref 0.0–0.1)
Basophils Relative: 1 %
EOS ABS: 0.1 10*3/uL (ref 0.0–0.5)
Eosinophils Relative: 2 %
HCT: 38.4 % — ABNORMAL LOW (ref 39.0–52.0)
HEMOGLOBIN: 12.2 g/dL — AB (ref 13.0–17.0)
Immature Granulocytes: 0 %
LYMPHS PCT: 26 %
Lymphs Abs: 1.8 10*3/uL (ref 0.7–4.0)
MCH: 31.7 pg (ref 26.0–34.0)
MCHC: 31.8 g/dL (ref 30.0–36.0)
MCV: 99.7 fL (ref 80.0–100.0)
Monocytes Absolute: 0.7 10*3/uL (ref 0.1–1.0)
Monocytes Relative: 10 %
NRBC: 0 % (ref 0.0–0.2)
Neutro Abs: 4.3 10*3/uL (ref 1.7–7.7)
Neutrophils Relative %: 61 %
Platelets: 189 10*3/uL (ref 150–400)
RBC: 3.85 MIL/uL — AB (ref 4.22–5.81)
RDW: 13.5 % (ref 11.5–15.5)
WBC: 7 10*3/uL (ref 4.0–10.5)

## 2018-11-04 LAB — BASIC METABOLIC PANEL
Anion gap: 10 (ref 5–15)
BUN: 21 mg/dL (ref 8–23)
CALCIUM: 9.3 mg/dL (ref 8.9–10.3)
CO2: 26 mmol/L (ref 22–32)
Chloride: 102 mmol/L (ref 98–111)
Creatinine, Ser: 1.36 mg/dL — ABNORMAL HIGH (ref 0.61–1.24)
GFR, EST AFRICAN AMERICAN: 58 mL/min — AB (ref >60)
GFR, EST NON AFRICAN AMERICAN: 50 mL/min — AB (ref >60)
Glucose, Bld: 102 mg/dL — ABNORMAL HIGH (ref 70–99)
Potassium: 4.1 mmol/L (ref 3.5–5.1)
SODIUM: 138 mmol/L (ref 135–145)

## 2018-11-11 ENCOUNTER — Ambulatory Visit (HOSPITAL_COMMUNITY)
Admission: RE | Admit: 2018-11-11 | Discharge: 2018-11-11 | Disposition: A | Discharge status: Home / Self Care | Payer: Medicare Other | Attending: Internal Medicine | Admitting: Internal Medicine

## 2018-11-11 ENCOUNTER — Other Ambulatory Visit: Payer: Self-pay

## 2018-11-11 ENCOUNTER — Encounter (HOSPITAL_COMMUNITY): Payer: Self-pay | Admitting: Internal Medicine

## 2018-11-11 ENCOUNTER — Ambulatory Visit (HOSPITAL_COMMUNITY)
Admission: RE | Disposition: A | Discharge status: Home / Self Care | Payer: Self-pay | Source: Home / Self Care | Attending: Internal Medicine

## 2018-11-11 DIAGNOSIS — Z880 Allergy status to penicillin: Secondary | ICD-10-CM | POA: Insufficient documentation

## 2018-11-11 DIAGNOSIS — Z4502 Encounter for adjustment and management of automatic implantable cardiac defibrillator: Secondary | ICD-10-CM | POA: Insufficient documentation

## 2018-11-11 DIAGNOSIS — G4730 Sleep apnea, unspecified: Secondary | ICD-10-CM | POA: Insufficient documentation

## 2018-11-11 DIAGNOSIS — Z87891 Personal history of nicotine dependence: Secondary | ICD-10-CM | POA: Diagnosis not present

## 2018-11-11 DIAGNOSIS — I480 Paroxysmal atrial fibrillation: Secondary | ICD-10-CM | POA: Diagnosis not present

## 2018-11-11 DIAGNOSIS — E669 Obesity, unspecified: Secondary | ICD-10-CM | POA: Diagnosis not present

## 2018-11-11 DIAGNOSIS — I495 Sick sinus syndrome: Secondary | ICD-10-CM | POA: Diagnosis not present

## 2018-11-11 DIAGNOSIS — Z8249 Family history of ischemic heart disease and other diseases of the circulatory system: Secondary | ICD-10-CM | POA: Diagnosis not present

## 2018-11-11 DIAGNOSIS — Z7901 Long term (current) use of anticoagulants: Secondary | ICD-10-CM | POA: Insufficient documentation

## 2018-11-11 DIAGNOSIS — I110 Hypertensive heart disease with heart failure: Secondary | ICD-10-CM | POA: Diagnosis not present

## 2018-11-11 DIAGNOSIS — Z6838 Body mass index (BMI) 38.0-38.9, adult: Secondary | ICD-10-CM | POA: Diagnosis not present

## 2018-11-11 DIAGNOSIS — I442 Atrioventricular block, complete: Secondary | ICD-10-CM | POA: Insufficient documentation

## 2018-11-11 DIAGNOSIS — I509 Heart failure, unspecified: Secondary | ICD-10-CM | POA: Diagnosis not present

## 2018-11-11 DIAGNOSIS — Z45010 Encounter for checking and testing of cardiac pacemaker pulse generator [battery]: Secondary | ICD-10-CM | POA: Diagnosis not present

## 2018-11-11 DIAGNOSIS — N400 Enlarged prostate without lower urinary tract symptoms: Secondary | ICD-10-CM | POA: Diagnosis not present

## 2018-11-11 DIAGNOSIS — I4892 Unspecified atrial flutter: Secondary | ICD-10-CM | POA: Insufficient documentation

## 2018-11-11 DIAGNOSIS — Z79899 Other long term (current) drug therapy: Secondary | ICD-10-CM | POA: Insufficient documentation

## 2018-11-11 HISTORY — PX: PPM GENERATOR CHANGEOUT: EP1233

## 2018-11-11 LAB — SURGICAL PCR SCREEN
MRSA, PCR: NEGATIVE
Staphylococcus aureus: POSITIVE — AB

## 2018-11-11 SURGERY — PPM GENERATOR CHANGEOUT

## 2018-11-11 MED ORDER — MIDAZOLAM HCL 5 MG/5ML IJ SOLN
INTRAMUSCULAR | Status: AC
  Filled 2018-11-11: qty 5

## 2018-11-11 MED ORDER — MUPIROCIN 2 % EX OINT
1.0000 "application " | TOPICAL_OINTMENT | Freq: Once | CUTANEOUS | Status: DC
  Filled 2018-11-11 (×2): qty 22

## 2018-11-11 MED ORDER — SODIUM CHLORIDE 0.9 % IV SOLN
INTRAVENOUS | Status: AC
  Filled 2018-11-11: qty 2

## 2018-11-11 MED ORDER — LIDOCAINE HCL (PF) 1 % IJ SOLN
INTRAMUSCULAR | Status: DC | PRN
  Administered 2018-11-11: 45 mL

## 2018-11-11 MED ORDER — CHLORHEXIDINE GLUCONATE 4 % EX LIQD
60.0000 mL | Freq: Once | CUTANEOUS | Status: DC
  Filled 2018-11-11: qty 60

## 2018-11-11 MED ORDER — VANCOMYCIN HCL IN DEXTROSE 1-5 GM/200ML-% IV SOLN
INTRAVENOUS | Status: AC
  Filled 2018-11-11: qty 200

## 2018-11-11 MED ORDER — SODIUM CHLORIDE 0.9 % IV SOLN
80.0000 mg | INTRAVENOUS | Status: AC
  Administered 2018-11-11: 80 mg
  Filled 2018-11-11: qty 2

## 2018-11-11 MED ORDER — VANCOMYCIN HCL IN DEXTROSE 1-5 GM/200ML-% IV SOLN
1000.0000 mg | INTRAVENOUS | Status: DC
  Administered 2018-11-11: 1000 mg via INTRAVENOUS
  Filled 2018-11-11: qty 200

## 2018-11-11 MED ORDER — SODIUM CHLORIDE 0.9 % IV SOLN
INTRAVENOUS | Status: DC
  Administered 2018-11-11: 06:00:00 via INTRAVENOUS

## 2018-11-11 MED ORDER — ONDANSETRON HCL 4 MG/2ML IJ SOLN: 4.0000 mg | Freq: Four times a day (QID) | INTRAMUSCULAR | Status: DC | PRN

## 2018-11-11 MED ORDER — ACETAMINOPHEN 325 MG PO TABS
325.0000 mg | ORAL_TABLET | ORAL | Status: DC | PRN
  Filled 2018-11-11: qty 2

## 2018-11-11 MED ORDER — FENTANYL CITRATE (PF) 100 MCG/2ML IJ SOLN
INTRAMUSCULAR | Status: AC
  Filled 2018-11-11: qty 2

## 2018-11-11 SURGICAL SUPPLY — 4 items
CABLE SURGICAL S-101-97-12 (CABLE) ×2 IMPLANT
PACEMAKER ASSURITY DR-RF (Pacemaker) ×1 IMPLANT
PAD PRO RADIOLUCENT 2001M-C (PAD) ×2 IMPLANT
TRAY PACEMAKER INSERTION (PACKS) ×2 IMPLANT

## 2018-11-11 NOTE — Discharge Instructions (Signed)
Re-Start Eliquis Thurs Morning No shower for 1 week   Pacemaker Battery Change, Care After This sheet gives you information about how to care for yourself after your procedure. Your health care provider may also give you more specific instructions. If you have problems or questions, contact your health care provider. What can I expect after the procedure? After your procedure, it is common to have:  Pain or soreness at the site where the pacemaker was inserted.  Swelling at the site where the pacemaker was inserted. Follow these instructions at home: Incision care   Keep the incision clean and dry. ? Do not take baths, swim, or use a hot tub until your health care provider approves. ? You may shower the day after your procedure, or as directed by your health care provider. ? Pat the area dry with a clean towel. Do not rub the area. This may cause bleeding.  Follow instructions from your health care provider about how to take care of your incision. Make sure you: ? Wash your hands with soap and water before you change your bandage (dressing). If soap and water are not available, use hand sanitizer. ? Change your dressing as told by your health care provider. ? Leave stitches (sutures), skin glue, or adhesive strips in place. These skin closures may need to stay in place for 2 weeks or longer. If adhesive strip edges start to loosen and curl up, you may trim the loose edges. Do not remove adhesive strips completely unless your health care provider tells you to do that.  Check your incision area every day for signs of infection. Check for: ? More redness, swelling, or pain. ? More fluid or blood. ? Warmth. ? Pus or a bad smell. Activity  Do not lift anything that is heavier than 10 lb (4.5 kg) until your health care provider says it is okay to do so.  For the first 2 weeks, or as long as told by your health care provider: ? Avoid lifting your left arm higher than your shoulder. ? Be  gentle when you move your arms over your head. It is okay to raise your arm to comb your hair. ? Avoid strenuous exercise.  Ask your health care provider when it is okay to: ? Resume your normal activities. ? Return to work or school. ? Resume sexual activity. Eating and drinking  Eat a heart-healthy diet. This should include plenty of fresh fruits and vegetables, whole grains, low-fat dairy products, and lean protein like chicken and fish.  Limit alcohol intake to no more than 1 drink a day for non-pregnant women and 2 drinks a day for men. One drink equals 12 oz of beer, 5 oz of wine, or 1 oz of hard liquor.  Check ingredients and nutrition facts on packaged foods and beverages. Avoid the following types of food: ? Food that is high in salt (sodium). ? Food that is high in saturated fat, like full-fat dairy or red meat. ? Food that is high in trans fat, like fried food. ? Food and drinks that are high in sugar. Lifestyle  Do not use any products that contain nicotine or tobacco, such as cigarettes and e-cigarettes. If you need help quitting, ask your health care provider.  Take steps to manage and control your weight.  Get regular exercise. Aim for 150 minutes of moderate-intensity exercise (such as walking or yoga) or 75 minutes of vigorous exercise (such as running or swimming) each week.  Manage other health  problems, such as diabetes or high blood pressure. Ask your health care provider how you can manage these conditions. General instructions  Do not drive for 24 hours after your procedure if you were given a medicine to help you relax (sedative).  Take over-the-counter and prescription medicines only as told by your health care provider.  Avoid putting pressure on the area where the pacemaker was placed.  If you need an MRI after your pacemaker has been placed, be sure to tell the health care provider who orders the MRI that you have a pacemaker.  Avoid close and  prolonged exposure to electrical devices that have strong magnetic fields. These include: ? Cell phones. Avoid keeping them in a pocket near the pacemaker, and try using the ear opposite the pacemaker. ? MP3 players. ? Household appliances, like microwaves. ? Metal detectors. ? Electric generators. ? High-tension wires.  Keep all follow-up visits as directed by your health care provider. This is important. Contact a health care provider if:  You have pain at the incision site that is not relieved by over-the-counter or prescription medicines.  You have any of these around your incision site or coming from it: ? More redness, swelling, or pain. ? Fluid or blood. ? Warmth to the touch. ? Pus or a bad smell.  You have a fever.  You feel brief, occasional palpitations, light-headedness, or any symptoms that you think might be related to your heart. Get help right away if:  You experience chest pain that is different from the pain at the pacemaker site.  You develop a red streak that extends above or below the incision site.  You experience shortness of breath.  You have palpitations or an irregular heartbeat.  You have light-headedness that does not go away quickly.  You faint or have dizzy spells.  Your pulse suddenly drops or increases rapidly and does not return to normal.  You begin to gain weight and your legs and ankles swell. Summary  After your procedure, it is common to have pain, soreness, and some swelling where the pacemaker was inserted.  Make sure to keep your incision clean and dry. Follow instructions from your health care provider about how to take care of your incision.  Check your incision every day for signs of infection, such as more pain or swelling, pus or a bad smell, warmth, or leaking fluid and blood.  Avoid strenuous exercise and lifting your left arm higher than your shoulder for 2 weeks, or as long as told by your health care provider. This  information is not intended to replace advice given to you by your health care provider. Make sure you discuss any questions you have with your health care provider. Document Released: 06/25/2013 Document Revised: 07/27/2016 Document Reviewed: 07/27/2016 Elsevier Interactive Patient Education  2019 [employer].

## 2018-11-11 NOTE — Interval H&P Note (Signed)
History and Physical Interval Note:  11/11/2018 7:34 AM  [name].  has presented today for surgery, with the diagnosis of eol  The various methods of treatment have been discussed with the patient and family. After consideration of risks, benefits and other options for treatment, the patient has consented to  Procedure(s): PPM GENERATOR CHANGEOUT (N/A) as a surgical intervention .  The patient's history has been reviewed, patient examined, no change in status, stable for surgery.  I have reviewed the patient's chart and labs.  Questions were answered to the patient's satisfaction.     [name]

## 2018-11-11 NOTE — Progress Notes (Signed)
Discharge instructions reviewed with pt and daughter. Also reviewed PCR results and mupirocin instructions. Voices understanding.

## 2018-11-12 MED FILL — Fentanyl Citrate Preservative Free (PF) Inj 100 MCG/2ML: INTRAMUSCULAR | Qty: 2 | Status: AC

## 2018-11-12 MED FILL — Midazolam HCl Inj 5 MG/5ML (Base Equivalent): INTRAMUSCULAR | Qty: 5 | Status: AC

## 2018-11-13 ENCOUNTER — Telehealth: Payer: Self-pay | Admitting: Internal Medicine

## 2018-11-13 NOTE — Telephone Encounter (Signed)
Informed pt that he can remove the top bandage but the steri - strips will need to remain in place until his wound check appt. No shower 7 days. Pt verbalized understanding.

## 2018-11-13 NOTE — Telephone Encounter (Signed)
Wanting to speak w/ someone about his device, wants to make sure he's doing everything correct.

## 2018-11-14 ENCOUNTER — Telehealth: Payer: Self-pay | Admitting: Internal Medicine

## 2018-11-14 NOTE — Telephone Encounter (Signed)
Tiajuana Amass, [place]     11/13/18 3:43 PM  Note    Informed pt that he can remove the top bandage but the steri - strips will need to remain in place until his wound check appt. No shower 7 days. Pt verbalized understanding.   -------------------------------------------------------------------------------------------------------------------     Wound site checked for patient.  Took top bandage off when he was supposed to.  (See note above)  All steri strips appeared to be intact.  No oozing noted on steri strips.  No redness or increased warmth to area around site.  Patient stated that the area was not sore.  Wound check scheduled for 11/25/2018 in [place] office.

## 2018-11-14 NOTE — Telephone Encounter (Signed)
[name] walked into the office stating that he wanted to see if someone could look at the wound site from procedure performed Monday by Dr. Lovena Le.

## 2018-11-25 ENCOUNTER — Ambulatory Visit (INDEPENDENT_AMBULATORY_CARE_PROVIDER_SITE_OTHER): Payer: Medicare Other | Admitting: *Deleted

## 2018-11-25 ENCOUNTER — Telehealth: Payer: Self-pay | Admitting: Cardiology

## 2018-11-25 DIAGNOSIS — I480 Paroxysmal atrial fibrillation: Secondary | ICD-10-CM | POA: Diagnosis not present

## 2018-11-25 DIAGNOSIS — I495 Sick sinus syndrome: Secondary | ICD-10-CM

## 2018-11-25 DIAGNOSIS — Z950 Presence of cardiac pacemaker: Secondary | ICD-10-CM

## 2018-11-25 LAB — CUP PACEART INCLINIC DEVICE CHECK
Battery Voltage: 3.08 V
Brady Statistic RA Percent Paced: 34 %
Brady Statistic RV Percent Paced: 99.84 %
Date Time Interrogation Session: 20200309144627
Implantable Lead Implant Date: 20090903
Implantable Lead Implant Date: 20090903
Implantable Lead Location: 753859
Implantable Lead Location: 753860
Implantable Pulse Generator Implant Date: 20200224
Lead Channel Impedance Value: 425 Ohm
Lead Channel Impedance Value: 512.5 Ohm
Lead Channel Pacing Threshold Amplitude: 0.875 V
Lead Channel Pacing Threshold Pulse Width: 0.5 ms
Lead Channel Sensing Intrinsic Amplitude: 1 mV
Lead Channel Sensing Intrinsic Amplitude: 12 mV
Lead Channel Setting Pacing Amplitude: 1.125
Lead Channel Setting Pacing Amplitude: 1.875
Lead Channel Setting Pacing Pulse Width: 0.5 ms
Lead Channel Setting Sensing Sensitivity: 4 mV
MDC IDC MSMT BATTERY REMAINING LONGEVITY: 115 mo
Pulse Gen Model: 2272
Pulse Gen Serial Number: 9107097

## 2018-11-25 NOTE — Patient Instructions (Signed)
We will contact you in about 2 weeks regarding your heart rhythm. Call the [facility] at [phone] with any questions.

## 2018-11-25 NOTE — Telephone Encounter (Signed)
Spoke with patient. Discussed his symptoms--feels like he "gives out" while walking his dog, but feels this is more likely related to his back pain that causes bilateral leg pain. Denies any fluid retention symptoms, weight stable within 1-2lbs, compliant with furosemide. Pt reports he held two days of apixaban prior to his PPM gen change on 11/11/18. Pt thinks he is more fatigued than usual today, but he isn't sure if this correlates with his increased AF/A-flutter burden for the past two weeks. Reports compliance with flecainide.  Offered pt appointment in the AF Clinic, but he declines at this time. Plan for remote transmission in 2 weeks to reassess rhythm. Pt agrees to call in the interim with any questions/concerns, or if he wishes to schedule an AF Clinic appointment.

## 2018-11-25 NOTE — Progress Notes (Signed)
Device Clinic pacemaker wound check. Steri-strips removed. Wound without redness or edema. Incision edges approximated, wound well healed. Normal device function. Thresholds, sensing, and impedances consistent with implant measurements. AutoCapture programmed on in both RA and RV, chronic leads. Histogram distribution appropriate for patient and level of activity. 66% AT/AF burden since gen change, +apixaban and flecainide, patient reports some fatigue over the past few days but denies any other symptoms at this time. No high ventricular rates noted. HVR detection increased to 175bpm per protocol. Patient educated about wound care, arm mobility, and Merlin monitor. Plan to reassess rhythm in 2 weeks via remote transmission, ROV with GT/R on 02/19/19.

## 2018-11-25 NOTE — Telephone Encounter (Signed)
Patient called and stated that he was seen in the office this morning and was told that he had been out of rhythm a lot. He wanted more information about this. Was he out of rhythm today. Patient states that he feels a little dizzy, and fatigue. He stated that it is more than usual. Instructed pt that I would send message to Device Tech RN and someone will call him back. Pt verbalized understanding.

## 2018-12-06 DIAGNOSIS — I5032 Chronic diastolic (congestive) heart failure: Secondary | ICD-10-CM | POA: Diagnosis not present

## 2018-12-06 DIAGNOSIS — Z79899 Other long term (current) drug therapy: Secondary | ICD-10-CM | POA: Diagnosis not present

## 2018-12-06 DIAGNOSIS — I495 Sick sinus syndrome: Secondary | ICD-10-CM | POA: Diagnosis not present

## 2018-12-09 ENCOUNTER — Other Ambulatory Visit: Payer: Self-pay

## 2018-12-09 ENCOUNTER — Ambulatory Visit: Payer: Medicare Other | Admitting: *Deleted

## 2018-12-11 ENCOUNTER — Telehealth: Payer: Self-pay | Admitting: *Deleted

## 2018-12-11 LAB — CUP PACEART REMOTE DEVICE CHECK
Battery Remaining Longevity: 112 mo
Battery Remaining Percentage: 95.5 %
Battery Voltage: 3.04 V
Brady Statistic AP VP Percent: 99 %
Brady Statistic AP VS Percent: 1 %
Brady Statistic AS VP Percent: 1 %
Brady Statistic AS VS Percent: 0 %
Brady Statistic RA Percent Paced: 21 %
Brady Statistic RV Percent Paced: 99 %
Date Time Interrogation Session: 20200323060014
Implantable Lead Implant Date: 20090903
Implantable Lead Implant Date: 20090903
Implantable Lead Location: 753859
Implantable Lead Location: 753860
Implantable Pulse Generator Implant Date: 20200224
Lead Channel Impedance Value: 410 Ohm
Lead Channel Impedance Value: 490 Ohm
Lead Channel Pacing Threshold Amplitude: 1 V
Lead Channel Pacing Threshold Amplitude: 1 V
Lead Channel Pacing Threshold Pulse Width: 0.5 ms
Lead Channel Pacing Threshold Pulse Width: 0.5 ms
Lead Channel Sensing Intrinsic Amplitude: 12 mV
Lead Channel Sensing Intrinsic Amplitude: 3.4 mV
Lead Channel Setting Pacing Amplitude: 1.25 V
Lead Channel Setting Pacing Amplitude: 2 V
Lead Channel Setting Pacing Pulse Width: 0.5 ms
Lead Channel Setting Sensing Sensitivity: 4 mV
Pulse Gen Model: 2272
Pulse Gen Serial Number: 9107097

## 2018-12-11 NOTE — Telephone Encounter (Signed)
Remote transmission reviewed for assessment of rhythm and atrial arrhythmia burden since PPM gen change on 11/11/18. AT/AF burden 79% since wound check on 11/25/18. Available EGMs show AT/A-flutter, +Eliquis and flecainide. Patient's main concern at wound check was increased fatigue.  Spoke with patient, he reports feeling at his baseline. Fatigue improved. Weight up 1lb overnight, but he consumed many snacks and drinks yesterday. Took AM furosemide and has been urinating frequently. No other fluid symptoms. Reviewed sodium/fluid restriction. Encouraged pt to call our office if any new or worsening symptoms. Remote transmission routed to Dr. Lovena Le for review. Advised we will call back if any new recommendations. Pt verbalizes understanding and denies questions or concerns at this time.

## 2018-12-12 ENCOUNTER — Telehealth: Payer: Self-pay

## 2018-12-12 NOTE — Telephone Encounter (Signed)
See phone note opened 12/12/18.

## 2018-12-12 NOTE — Telephone Encounter (Signed)
Spoke with patient. Discussed results from PPM transmission on 12/09/18. Pt reports dizziness is fleeting, occurs when he first stands up after sitting for awhile. Feels changing positions slowly helps and he tries to remember to do this. BPs 110s/60s at rest. Advised pt to discuss with Dr. Willey Blade at his appointment tomorrow. Pt verbalizes understanding and thanked me for my call.

## 2018-12-12 NOTE — Telephone Encounter (Signed)
Pt wanted Raquel Sarna to go over what she seen on his transmission again. He will be seeing his primary care Dr. And wants to be accurate. He also wanted to know if we could send the information to his Primary Care.

## 2018-12-12 NOTE — Telephone Encounter (Signed)
Pt called wanting to speak w/ device again. States he's dizzy when he gets up to walk around and would like to speak with who he talked to yesterday.

## 2018-12-13 DIAGNOSIS — I480 Paroxysmal atrial fibrillation: Secondary | ICD-10-CM | POA: Diagnosis not present

## 2018-12-13 DIAGNOSIS — I5032 Chronic diastolic (congestive) heart failure: Secondary | ICD-10-CM | POA: Diagnosis not present

## 2019-02-11 ENCOUNTER — Encounter: Payer: Self-pay | Admitting: Internal Medicine

## 2019-02-11 DIAGNOSIS — Z79899 Other long term (current) drug therapy: Secondary | ICD-10-CM | POA: Diagnosis not present

## 2019-02-11 DIAGNOSIS — I5032 Chronic diastolic (congestive) heart failure: Secondary | ICD-10-CM | POA: Diagnosis not present

## 2019-02-11 DIAGNOSIS — I480 Paroxysmal atrial fibrillation: Secondary | ICD-10-CM | POA: Diagnosis not present

## 2019-02-13 ENCOUNTER — Telehealth: Payer: Self-pay | Admitting: Internal Medicine

## 2019-02-13 NOTE — Telephone Encounter (Signed)
Virtual Visit Pre-Appointment Phone Call  "(Name), I am calling you today to discuss your upcoming appointment. We are currently trying to limit exposure to the virus that causes COVID-19 by seeing patients at home rather than in the office."  1. "What is the BEST phone number to call the day of the visit?" - include this in appointment notes  2. Do you have or have access to (through a family member/friend) a smartphone with video capability that we can use for your visit?" a. If yes - list this number in appt notes as cell (if different from BEST phone #) and list the appointment type as a VIDEO visit in appointment notes b. If no - list the appointment type as a PHONE visit in appointment notes  3. Confirm consent - "In the setting of the current Covid19 crisis, you are scheduled for a (phone or video) visit with your provider on (date) at (time).  Just as we do with many in-office visits, in order for you to participate in this visit, we must obtain consent.  If you'd like, I can send this to your mychart (if signed up) or email for you to review.  Otherwise, I can obtain your verbal consent now.  All virtual visits are billed to your insurance company just like a normal visit would be.  By agreeing to a virtual visit, we'd like you to understand that the technology does not allow for your provider to perform an examination, and thus may limit your provider's ability to fully assess your condition. If your provider identifies any concerns that need to be evaluated in person, we will make arrangements to do so.  Finally, though the technology is pretty good, we cannot assure that it will always work on either your or our end, and in the setting of a video visit, we may have to convert it to a phone-only visit.  In either situation, we cannot ensure that we have a secure connection.  Are you willing to proceed?" STAFF: Did the patient verbally acknowledge consent to telehealth visit? Document  YES/NO here: Yes  4. Advise patient to be prepared - "Two hours prior to your appointment, go ahead and check your blood pressure, pulse, oxygen saturation, and your weight (if you have the equipment to check those) and write them all down. When your visit starts, your provider will ask you for this information. If you have an Apple Watch or Kardia device, please plan to have heart rate information ready on the day of your appointment. Please have a pen and paper handy nearby the day of the visit as well."  5. Give patient instructions for MyChart download to smartphone OR [email or web address] as below if video visit (depending on what platform provider is using)  6. Inform patient they will receive a phone call 15 minutes prior to their appointment time (may be from unknown caller ID) so they should be prepared to answer    Gregory Victoria Jr. has been deemed a candidate for a follow-up tele-health visit to limit community exposure during the Covid-19 pandemic. I spoke with the patient via phone to ensure availability of phone/video source, confirm preferred email & phone number, and discuss instructions and expectations.  I reminded [name]. to be prepared with any vital sign and/or heart rhythm information that could potentially be obtained via home monitoring, at the time of his visit. I reminded [name]. to expect a phone  call prior to his visit.  [name] 02/13/2019 11:55 AM

## 2019-02-17 ENCOUNTER — Encounter: Payer: Medicare Other | Admitting: Internal Medicine

## 2019-02-17 DIAGNOSIS — I480 Paroxysmal atrial fibrillation: Secondary | ICD-10-CM | POA: Diagnosis not present

## 2019-02-17 DIAGNOSIS — I5032 Chronic diastolic (congestive) heart failure: Secondary | ICD-10-CM | POA: Diagnosis not present

## 2019-02-19 ENCOUNTER — Telehealth (INDEPENDENT_AMBULATORY_CARE_PROVIDER_SITE_OTHER): Payer: Medicare Other | Admitting: Internal Medicine

## 2019-02-19 ENCOUNTER — Telehealth: Payer: Self-pay | Admitting: Internal Medicine

## 2019-02-19 ENCOUNTER — Other Ambulatory Visit: Payer: Self-pay

## 2019-02-19 DIAGNOSIS — I480 Paroxysmal atrial fibrillation: Secondary | ICD-10-CM | POA: Diagnosis not present

## 2019-02-19 DIAGNOSIS — I495 Sick sinus syndrome: Secondary | ICD-10-CM | POA: Diagnosis not present

## 2019-02-19 DIAGNOSIS — Z950 Presence of cardiac pacemaker: Secondary | ICD-10-CM | POA: Diagnosis not present

## 2019-02-19 NOTE — Progress Notes (Signed)
Electrophysiology TeleHealth Note   Due to national recommendations of social distancing due to COVID 19, an audio/video telehealth visit is felt to be most appropriate for this patient at this time.  See MyChart message from today for the patient's consent to telehealth for [name].   Date:  02/19/2019   ID:  [name], DOB 16-Mar-1941, MRN [id number]  Location: patient's home  Provider location: [address], [place]  Evaluation Performed: Follow-up visit  PCP:  [name], MD  Cardiologist:  [name], MD  Electrophysiologist:  Dr Lovena Le  Chief Complaint:  "I am fairly well but my lower back is bad."  History of Present Illness:    Gregory Millirons. is a 78 y.o. male who presents via audio/video conferencing for a telehealth visit today. He is a pleasant 78 yo man with PAF, sinus node dysfunction, s/p PPM insertion. He underwent gen change out 3 months ago. He has been bothered by his back. Since last being seen in our clinic, the patient reports doing very well.  Today, he denies symptoms of palpitations, chest pain, shortness of breath,  lower extremity edema, dizziness, presyncope, or syncope.  The patient is otherwise without complaint today.  The patient denies symptoms of fevers, chills, cough, or new SOB worrisome for COVID 19.  Past Medical History:  Diagnosis Date  . Atrial flutter ([place])    a. s/p ablation 01/2015.  Marland Kitchen BPH (benign prostatic hyperplasia)   . Cerebrovascular disease    carotid bruit; no focal stenosis by carotid ultrasound in 05/2008  . Congestive heart failure (CHF) ([place])   . Dysrhythmia   . General weakness    +malaise, exercise intolerance, and near syncope  . Hypertension   . Low back pain    history of traumax2  . Paroxysmal atrial fibrillation ([facility])    on Eliquis  . Presence of permanent cardiac pacemaker 2009  . Sleep apnea    c pap at night  . Tachy-brady syndrome ([place])    a. s/p St. Jude pacemaker  2009.    Past Surgical History:  Procedure Laterality Date  . ABLATION    . CATARACT EXTRACTION     Bilateral  . CERVICAL DISCECTOMY  2006  . COLONOSCOPY WITH PROPOFOL N/A 01/26/2017   Procedure: COLONOSCOPY WITH PROPOFOL;  Surgeon: [name], MD;  Location: [facility];  Service: Endoscopy;  Laterality: N/A;  8:30  . ELECTROPHYSIOLOGIC STUDY N/A 01/18/2015   Procedure: A-Flutter/A-Tach/Svt Ablation;  Surgeon: [name], MD;  Location: [facility];  Service: Cardiovascular;  Laterality: N/A;  . KNEE ARTHROSCOPY WITH MEDIAL MENISECTOMY Left 06/03/2015   Procedure: KNEE ARTHROSCOPY WITH MEDIAL MENISECTOMY;  Surgeon: [name], MD;  Location: [facility] ORS;  Service: Orthopedics;  Laterality: Left;  . PACEMAKER INSERTION  1966  . POLYPECTOMY  01/26/2017   Procedure: POLYPECTOMY;  Surgeon: [name], MD;  Location: [facility];  Service: Endoscopy;;  colon  . PPM GENERATOR CHANGEOUT N/A 11/11/2018   Procedure: PPM GENERATOR CHANGEOUT;  Surgeon: [name], MD;  Location: [facility] CV [facility];  Service: Cardiovascular;  Laterality: N/A;    Current Outpatient Medications  Medication Sig Dispense Refill  . acetaminophen (TYLENOL) 500 MG tablet Take 1,000 mg by mouth 2 (two) times daily.    Marland Kitchen antiseptic oral rinse (BIOTENE) LIQD 15 mLs by Mouth Rinse route as needed for dry mouth.    Marland Kitchen apixaban (ELIQUIS) 5 MG TABS  tablet Take 1 tablet (5 mg total) by mouth 2 (two) times daily. 60 tablet 1  . CALCIUM-MAGNESIUM-VITAMIN D PO Take 1 tablet by mouth at bedtime.     Marland Kitchen diltiazem (TIAZAC) 120 MG 24 hr capsule Take 120 mg by mouth every evening.     . diltiazem (TIAZAC) 300 MG 24 hr capsule Take 300 mg by mouth daily.     . flecainide (TAMBOCOR) 100 MG tablet Take 1.5 tablets (150 mg total) by mouth 2 (two) times daily. 270 tablet 3  . furosemide (LASIX) 40 MG tablet Take 1 tablet (40 mg total) by mouth 2 (two) times daily. (Patient taking differently: Take 40-80 mg by  mouth 2 (two) times daily. Take 2 tablets (80 mg) by mouth in the morning & take 1 tablet (40 mg) by mouth in the evening.) 60 tablet 0  . gabapentin (NEURONTIN) 300 MG capsule Take 1,200 mg by mouth 2 (two) times daily.     . hydrocortisone (ANUSOL-HC) 2.5 % rectal cream Place 1 application rectally as needed for hemorrhoids (after each bowel movement).     Marland Kitchen ketoconazole (NIZORAL) 2 % cream Apply 1 application topically daily as needed (skin irritation).     Marland Kitchen lidocaine (LIDODERM) 5 % Place 1 patch onto the skin daily as needed (back pain.). Remove & Discard patch within 12 hours or as directed by MD    . lidocaine (XYLOCAINE) 5 % ointment Apply 1 application topically 3 (three) times daily as needed (back pain.).    Marland Kitchen metoprolol tartrate (LOPRESSOR) 25 MG tablet Take 0.5 tablets (12.5 mg total) by mouth 2 (two) times daily. 90 tablet 3  . pantoprazole (PROTONIX) 40 MG tablet Take 40 mg by mouth daily.    . Tamsulosin HCl (FLOMAX) 0.4 MG CAPS Take 0.4 mg by mouth daily.     . traMADol (ULTRAM) 50 MG tablet Take by mouth every 6 (six) hours as needed.    . zinc oxide (BALMEX) 11.3 % CREA cream Apply 1 application topically 2 (two) times daily as needed (skin breakdown).      No current facility-administered medications for this visit.     Allergies:   Bee venom; Shellfish allergy; and Penicillins   Social History:  The patient  reports that he quit smoking about 56 years ago. His smoking use included cigarettes. He has a 0.50 pack-year smoking history. He has never used smokeless tobacco. He reports that he does not drink alcohol or use drugs.   Family History:  The patient's  family history includes Hypertension in an other family member.   ROS:  Please see the history of present illness.   All other systems are personally reviewed and negative.    Exam:    Vital Signs:  Oxygen 95%, BP - 1123/75, P - 70 Wt - 250 lb   Labs/Other Tests and Data Reviewed:    Recent Labs: 11/04/2018:  BUN 21; Creatinine, Ser 1.36; Hemoglobin 12.2; Platelets 189; Potassium 4.1; Sodium 138   Wt Readings from Last 3 Encounters:  11/11/18 247 lb (112 kg)  10/14/18 255 lb (115.7 kg)  11/27/17 237 lb 9.6 oz (107.8 kg)     Other studies personally reviewed:  Last device remote is reviewed from [facility] PDF dated 12/09/18 which reveals normal device function, no arrhythmias except atrial fib   ASSESSMENT & PLAN:    1.  Atrial fib - he is minimally symptomatic. He will continue his current meds. 2. HTN - his pressure have been controlled.  3. PPM - his medtronic DDD PM is working normally.  4.COVID 19 screen The patient denies symptoms of COVID 19 at this time.  The importance of social distancing was discussed today.  Follow-up:  6 months in [place] Next remote: 6/20  Current medicines are reviewed at length with the patient today.   The patient does not have concerns regarding his medicines.  The following changes were made today:  none  Labs/ tests ordered today include: none No orders of the defined types were placed in this encounter.    Patient Risk:  after full review of this patients clinical status, I feel that they are at moderate risk at this time.  Today, I have spent 15 minutes with the patient with telehealth technology discussing all of the above.    Signed, [name], MD  02/19/2019 9:55 AM     [address] [place] [address] [phone] (office) [phone] (fax)

## 2019-02-19 NOTE — Telephone Encounter (Signed)
Patient  just had a Telemedicine visit with Dr. Lovena Le. He was calling the office because he had another question for Dr. Lovena Le after the phone call ended.

## 2019-02-19 NOTE — Telephone Encounter (Signed)
Returned pt call. He said that he forgot to ask Dr.Taylor if his heart being out of rhythm the majority of the time, causes his constant dizziness and off balance. He said even when he stands up slowly from a sitting position and he is up and walking carefully, he is still constantly dizzy. Please advise.

## 2019-02-19 NOTE — Patient Instructions (Signed)
Medication Instructions:  Your physician recommends that you continue on your current medications as directed. Please refer to the Current Medication list given to you today.   Labwork: NONE  Testing/Procedures: NONE   Follow-Up: Your physician wants you to follow-up in: 6 Months with Dr. Taylor.  You will receive a reminder letter in the mail two months in advance. If you don't receive a letter, please call our office to schedule the follow-up appointment.   Any Other Special Instructions Will Be Listed Below (If Applicable).     If you need a refill on your cardiac medications before your next appointment, please call your pharmacy.  Thank you for choosing [facility]!   

## 2019-03-07 NOTE — Telephone Encounter (Signed)
I will forward to Dr. Tanna Furry nurse @ [address] to see if she can get an answer for the pt's question.

## 2019-03-11 ENCOUNTER — Encounter: Payer: Self-pay | Admitting: Internal Medicine

## 2019-03-11 NOTE — Telephone Encounter (Signed)
Call placed to Pt.  Per Dr. Sherron Ales Pt stop flecainide.  Return in one week for an EKG.  Pt scheduled for office visit next Tuesday for EKG and to discuss tx options.

## 2019-03-11 NOTE — Telephone Encounter (Signed)
Error

## 2019-03-17 ENCOUNTER — Telehealth: Payer: Self-pay | Admitting: Internal Medicine

## 2019-03-17 NOTE — Telephone Encounter (Signed)
New Message         COVID-19 Pre-Screening Questions:   In the past 7 to 10 days have you had a cough,  shortness of breath, headache, congestion, fever (100 or greater) body aches, chills, sore throat, or sudden loss of taste or sense of smell? He said he has a dry cough, fatigue  Have you been around anyone with known Covid 19. NO  Have you been around anyone who is awaiting Covid 19 test results in the past 7 to 10 days? NO  Have you been around anyone who has been exposed to Covid 19, or has mentioned symptoms of Covid 19 within the past 7 to 10 days? NO  If you have any concerns/questions about symptoms patients report during screening (either on the phone or at threshold). Contact the provider seeing the patient or DOD for further guidance.  If neither are available contact a member of the leadership team.

## 2019-03-18 ENCOUNTER — Other Ambulatory Visit: Payer: Self-pay

## 2019-03-18 ENCOUNTER — Encounter: Payer: Self-pay | Admitting: Internal Medicine

## 2019-03-18 ENCOUNTER — Ambulatory Visit (INDEPENDENT_AMBULATORY_CARE_PROVIDER_SITE_OTHER): Payer: Medicare Other | Admitting: Internal Medicine

## 2019-03-18 VITALS — BP 148/80 | HR 70 | Ht 68.0 in | Wt 250.0 lb

## 2019-03-18 DIAGNOSIS — Z950 Presence of cardiac pacemaker: Secondary | ICD-10-CM | POA: Diagnosis not present

## 2019-03-18 DIAGNOSIS — I495 Sick sinus syndrome: Secondary | ICD-10-CM | POA: Diagnosis not present

## 2019-03-18 DIAGNOSIS — I480 Paroxysmal atrial fibrillation: Secondary | ICD-10-CM | POA: Diagnosis not present

## 2019-03-18 LAB — CUP PACEART INCLINIC DEVICE CHECK
Battery Remaining Longevity: 118 mo
Battery Voltage: 3.01 V
Brady Statistic RA Percent Paced: 23 %
Brady Statistic RV Percent Paced: 99.85 %
Date Time Interrogation Session: 20200630161430
Implantable Lead Implant Date: 20090903
Implantable Lead Implant Date: 20090903
Implantable Lead Location: 753859
Implantable Lead Location: 753860
Implantable Pulse Generator Implant Date: 20200224
Lead Channel Impedance Value: 425 Ohm
Lead Channel Impedance Value: 512.5 Ohm
Lead Channel Pacing Threshold Amplitude: 0.75 V
Lead Channel Pacing Threshold Amplitude: 0.875 V
Lead Channel Pacing Threshold Pulse Width: 0.5 ms
Lead Channel Pacing Threshold Pulse Width: 0.5 ms
Lead Channel Sensing Intrinsic Amplitude: 1.8 mV
Lead Channel Sensing Intrinsic Amplitude: 12 mV
Lead Channel Setting Pacing Amplitude: 1 V
Lead Channel Setting Pacing Amplitude: 1.875
Lead Channel Setting Pacing Pulse Width: 0.5 ms
Lead Channel Setting Sensing Sensitivity: 4 mV
Pulse Gen Model: 2272
Pulse Gen Serial Number: 9107097

## 2019-03-18 NOTE — Progress Notes (Signed)
Electrophysiology Office Note Date: 03/18/2019  ID:  Gregory Sciulli., DOB 1940-10-10, MRN [id number]  PCP: [name], MD Primary Cardiologist: [name], MD Electrophysiologist: None  CC: Pacemaker follow-up  [name]. is a 78 y.o. male seen today for routine electrophysiology followup. He is seen for continued management of diastolic heart failure,paroxysmal atrial arrhythmias, obesity, and complete heart block status post pacemaker insertion. Stable s/p recent gen change 10/2018.  Since last being seen in our clinic, the patient reports doing very well. Flecainide recently stopped with lengthened QRS. Now improved. He has occasional lightheadedness with rapid standing, but this is not marked or limiting. He occasionally has palpitations, but not often. He denies chest pain, dyspnea, PND, orthopnea, nausea, vomiting, dizziness, syncope, edema, weight gain, or early satiety.  Device History: St Jude dual chamber PPM implanted 2009 for tachy-brady syndrome S/p Gen change 10/2018   Past Medical History:  Diagnosis Date  . Atrial flutter ([place])    a. s/p ablation 01/2015.  Marland Kitchen BPH (benign prostatic hyperplasia)   . Cerebrovascular disease    carotid bruit; no focal stenosis by carotid ultrasound in 05/2008  . Congestive heart failure (CHF) ([place])   . Dysrhythmia   . General weakness    +malaise, exercise intolerance, and near syncope  . Hypertension   . Low back pain    history of traumax2  . Paroxysmal atrial fibrillation (HCC)    on Eliquis  . Presence of permanent cardiac pacemaker 2009  . Sleep apnea    c pap at night  . Tachy-brady syndrome ([place])    a. s/p St. Jude pacemaker 2009.   Past Surgical History:  Procedure Laterality Date  . ABLATION    . CATARACT EXTRACTION     Bilateral  . CERVICAL DISCECTOMY  2006  . COLONOSCOPY WITH PROPOFOL N/A 01/26/2017   Procedure: COLONOSCOPY WITH PROPOFOL;  Surgeon: [name], MD;  [facility];   Service: Endoscopy;  Laterality: N/A;  8:30  . ELECTROPHYSIOLOGIC STUDY N/A 01/18/2015   Procedure: A-Flutter/A-Tach/Svt Ablation;  Surgeon: [name], MD;  Location: [facility];  Service: Cardiovascular;  Laterality: N/A;  . KNEE ARTHROSCOPY WITH MEDIAL MENISECTOMY Left 06/03/2015   Procedure: KNEE ARTHROSCOPY WITH MEDIAL MENISECTOMY;  Surgeon: [name], MD;  Location: [facility];  Service: Orthopedics;  Laterality: Left;  . PACEMAKER INSERTION  1966  . POLYPECTOMY  01/26/2017   Procedure: POLYPECTOMY;  Surgeon: [name], MD;  [facility];  Service: Endoscopy;;  colon  . PPM GENERATOR CHANGEOUT N/A 11/11/2018   Procedure: PPM GENERATOR CHANGEOUT;  Surgeon: [name], MD;  Location: [facility];  Service: Cardiovascular;  Laterality: N/A;    Current Outpatient Medications  Medication Sig Dispense Refill  . acetaminophen (TYLENOL) 500 MG tablet Take 1,000 mg by mouth 2 (two) times daily.    Marland Kitchen antiseptic oral rinse (BIOTENE) LIQD 15 mLs by Mouth Rinse route as needed for dry mouth.    Marland Kitchen apixaban (ELIQUIS) 5 MG TABS tablet Take 1 tablet (5 mg total) by mouth 2 (two) times daily. 60 tablet 1  . CALCIUM-MAGNESIUM-VITAMIN D PO Take 1 tablet by mouth at bedtime.     Marland Kitchen diltiazem (TIAZAC) 120 MG 24 hr capsule Take 120 mg by mouth every evening.     . diltiazem (TIAZAC) 300 MG 24 hr capsule Take 300 mg by mouth daily.     . furosemide (LASIX) 40 MG tablet Take  1 tablet (40 mg total) by mouth 2 (two) times daily. (Patient taking differently: Take 40-80 mg by mouth 2 (two) times daily. Take 2 tablets (80 mg) by mouth in the morning & take 1 tablet (40 mg) by mouth in the evening.) 60 tablet 0  . gabapentin (NEURONTIN) 300 MG capsule Take 1,200 mg by mouth 2 (two) times daily.     . hydrocortisone (ANUSOL-HC) 2.5 % rectal cream Place 1 application rectally as needed for hemorrhoids (after each bowel movement).     Marland Kitchen ketoconazole (NIZORAL) 2 % cream Apply 1  application topically daily as needed (skin irritation).     Marland Kitchen lidocaine (LIDODERM) 5 % Place 1 patch onto the skin daily as needed (back pain.). Remove & Discard patch within 12 hours or as directed by MD    . lidocaine (XYLOCAINE) 5 % ointment Apply 1 application topically 3 (three) times daily as needed (back pain.).    Marland Kitchen metoprolol tartrate (LOPRESSOR) 25 MG tablet Take 0.5 tablets (12.5 mg total) by mouth 2 (two) times daily. 90 tablet 3  . pantoprazole (PROTONIX) 40 MG tablet Take 40 mg by mouth daily.    . Tamsulosin HCl (FLOMAX) 0.4 MG CAPS Take 0.4 mg by mouth daily.     . traMADol (ULTRAM) 50 MG tablet Take by mouth every 6 (six) hours as needed.    . zinc oxide (BALMEX) 11.3 % CREA cream Apply 1 application topically 2 (two) times daily as needed (skin breakdown).      No current facility-administered medications for this visit.     Allergies:   Bee venom, Shellfish allergy, and Penicillins   Social History: Social History   Socioeconomic History  . Marital status: Divorced    Spouse name: Not on file  . Number of children: 3  . Years of education: 12+  . Highest education level: Not on file  Occupational History  . Occupation: Retired  Scientific laboratory technician  . Financial resource strain: Not on file  . Food insecurity    Worry: Not on file    Inability: Not on file  . Transportation needs    Medical: Not on file    Non-medical: Not on file  Tobacco Use  . Smoking status: Former Smoker    Packs/day: 1.00    Years: 0.50    Pack years: 0.50    Types: Cigarettes    Quit date: 03/27/1962    Years since quitting: 57.0  . Smokeless tobacco: Never Used  Substance and Sexual Activity  . Alcohol use: No    Alcohol/week: 0.0 standard drinks  . Drug use: No  . Sexual activity: Not Currently  Lifestyle  . Physical activity    Days per week: Not on file    Minutes per session: Not on file  . Stress: Not on file  Relationships  . Social Herbalist on phone: Not on  file    Gets together: Not on file    Attends religious service: Not on file    Active member of club or organization: Not on file    Attends meetings of clubs or organizations: Not on file    Relationship status: Not on file  . Intimate partner violence    Fear of current or ex partner: Not on file    Emotionally abused: Not on file    Physically abused: Not on file    Forced sexual activity: Not on file  Other Topics Concern  . Not on file  Social History Narrative   Divorced with 3 adult children. Semi retired.    Caffeine use: once daily    Family History: Family History  Problem Relation Age of Onset  . Hypertension Other   . Neuropathy Neg Hx   . Stroke Neg Hx      Review of Systems: All other systems reviewed and are otherwise negative except as noted above.  Physical Exam: Vitals:   03/18/19 1437  BP: (!) 148/80  Pulse: 70  SpO2: 97%  Weight: 250 lb (113.4 kg)  Height: 5\' 8"  (1.727 m)     GEN- The patient is well appearing, alert and oriented x 3 today.   HEENT: normocephalic, atraumatic; sclera clear, conjunctiva pink; hearing intact; oropharynx clear; neck supple  Lungs- Clear to ausculation bilaterally, normal work of breathing.  No wheezes, rales, rhonchi Heart- Regular rate and rhythm, no murmurs, rubs or gallops  Abdomen- Obese, soft, non-tender, non-distended, bowel sounds present  Extremities- no clubbing, cyanosis, or edema  MS- no significant deformity or atrophy Skin- warm and dry, no rash or lesion; PPM pocket well healed Psych- euthymic mood, full affect Neuro- strength and sensation are intact  PPM Interrogation- reviewed in detail today,  See PACEART report  EKG:  EKG is ordered today. The ekg ordered today shows V paced at 70 bpm, with underlying Afib, and QRS 152 ms.  Recent Labs: 11/04/2018: BUN 21; Creatinine, Ser 1.36; Hemoglobin 12.2; Platelets 189; Potassium 4.1; Sodium 138   Wt Readings from Last 3 Encounters:  03/18/19 250 lb  (113.4 kg)  11/11/18 247 lb (112 kg)  10/14/18 255 lb (115.7 kg)    Assessment and Plan:  1. Sinus node dysfunction - He is s/p PPM. Stable device function.  S/p gen change 10/2018 2. PAF - He has history of both AF and Aflutter, which is paroxysmal. He is in AFib with controlled ventricular rate today. Covered on Eliquis.   3. PPM - his St. Jude DDD PPM is functioning normally.  4. Obesity - Encouraged weight loss.   Current medicines are reviewed at length with the patient today.   The patient does not have concerns regarding his medicines.  The following changes were made today:  None   Labs/ tests ordered today include:  Orders Placed This Encounter  Procedures  . EKG 12-Lead   Disposition:   Follow up with me annually. Continue 3 month remote checks. I have strongly encouraged the patient to lose weight.   Signed, Carleene Overlie Taylor,M.D. [place] [address] [phone] (office) [phone] (fax)

## 2019-03-18 NOTE — Patient Instructions (Addendum)
Medication Instructions:  Your physician recommends that you continue on your current medications as directed. Please refer to the Current Medication list given to you today.  Labwork: None ordered.  Testing/Procedures: None ordered.  Follow-Up: Your physician wants you to follow-up in: November with Dr. Lovena Le in [place].   You will receive a reminder letter in the mail two months in advance. If you don't receive a letter, please call our office to schedule the follow-up appointment.  Remote monitoring is used to monitor your Pacemaker from home. This monitoring reduces the number of office visits required to check your device to one time per year. It allows [place] to keep an eye on the functioning of your device to ensure it is working properly. You are scheduled for a device check from home on 06/17/2019. You may send your transmission at any time that day. If you have a wireless device, the transmission will be sent automatically. After your physician reviews your transmission, you will receive a postcard with your next transmission date.  Any Other Special Instructions Will Be Listed Below (If Applicable).  If you need a refill on your cardiac medications before your next appointment, please call your pharmacy.

## 2019-03-25 ENCOUNTER — Ambulatory Visit (INDEPENDENT_AMBULATORY_CARE_PROVIDER_SITE_OTHER): Payer: Medicare Other | Admitting: *Deleted

## 2019-03-25 DIAGNOSIS — I495 Sick sinus syndrome: Secondary | ICD-10-CM

## 2019-03-25 LAB — CUP PACEART REMOTE DEVICE CHECK
Date Time Interrogation Session: 20200707193049
Implantable Lead Implant Date: 20090903
Implantable Lead Implant Date: 20090903
Implantable Lead Location: 753859
Implantable Lead Location: 753860
Implantable Pulse Generator Implant Date: 20200224
Pulse Gen Model: 2272
Pulse Gen Serial Number: 9107097

## 2019-04-04 ENCOUNTER — Encounter: Payer: Self-pay | Admitting: Cardiology

## 2019-04-04 NOTE — Progress Notes (Signed)
Remote pacemaker transmission.   

## 2019-04-23 DIAGNOSIS — Z79899 Other long term (current) drug therapy: Secondary | ICD-10-CM | POA: Diagnosis not present

## 2019-04-23 DIAGNOSIS — I5032 Chronic diastolic (congestive) heart failure: Secondary | ICD-10-CM | POA: Diagnosis not present

## 2019-04-23 DIAGNOSIS — I480 Paroxysmal atrial fibrillation: Secondary | ICD-10-CM | POA: Diagnosis not present

## 2019-04-30 ENCOUNTER — Telehealth: Payer: Self-pay | Admitting: Internal Medicine

## 2019-04-30 DIAGNOSIS — I5032 Chronic diastolic (congestive) heart failure: Secondary | ICD-10-CM | POA: Diagnosis not present

## 2019-04-30 DIAGNOSIS — N183 Chronic kidney disease, stage 3 (moderate): Secondary | ICD-10-CM | POA: Diagnosis not present

## 2019-04-30 DIAGNOSIS — I480 Paroxysmal atrial fibrillation: Secondary | ICD-10-CM | POA: Diagnosis not present

## 2019-04-30 NOTE — Telephone Encounter (Signed)
Spoke with pt today who has questions regarding his flecainide. He states he is feeling well and has no sx of elevated HR, palps, etc. He is asking if he is supposed to start back on his flecainide since it being discontinued in the beginning of June for prolonged QRS.   I explained to pt per Dr. Tanna Furry last OV note, there was no indication he was planning on restarting flecainide at that time. He is to follow up with Dr Lovena Le in November, but should call if he notices an increase in HR, palps, etc. Pt asked I send a copy of Dr. Tanna Furry OV note to his PCP, Dr. Willey Blade, who he states was surprised pt was taken off flecainide. Pt could not explain to me his PCP's concern and again denied any elevated HR's or palps.   Pt understands he is not to begin flecainide at this time unless otherwise informed to do so from Dr. Lovena Le. He has verbalized understanding and had no additional questions.

## 2019-04-30 NOTE — Telephone Encounter (Signed)
Patient wants to make sure he still needs to be off of flecainide, or if he needs to restart it again.

## 2019-05-28 ENCOUNTER — Other Ambulatory Visit: Payer: Self-pay

## 2019-05-28 DIAGNOSIS — Z20822 Contact with and (suspected) exposure to covid-19: Secondary | ICD-10-CM

## 2019-05-28 DIAGNOSIS — R6889 Other general symptoms and signs: Secondary | ICD-10-CM | POA: Diagnosis not present

## 2019-05-30 LAB — NOVEL CORONAVIRUS, NAA: SARS-CoV-2, NAA: NOT DETECTED

## 2019-06-06 DIAGNOSIS — Z23 Encounter for immunization: Secondary | ICD-10-CM | POA: Diagnosis not present

## 2019-06-17 ENCOUNTER — Ambulatory Visit (INDEPENDENT_AMBULATORY_CARE_PROVIDER_SITE_OTHER): Payer: Medicare Other | Admitting: *Deleted

## 2019-06-17 DIAGNOSIS — I480 Paroxysmal atrial fibrillation: Secondary | ICD-10-CM

## 2019-06-17 DIAGNOSIS — I495 Sick sinus syndrome: Secondary | ICD-10-CM

## 2019-06-17 LAB — CUP PACEART REMOTE DEVICE CHECK
Battery Remaining Longevity: 114 mo
Battery Remaining Percentage: 95.5 %
Battery Voltage: 2.99 V
Brady Statistic AP VP Percent: 86 %
Brady Statistic AP VS Percent: 1 %
Brady Statistic AS VP Percent: 14 %
Brady Statistic AS VS Percent: 0 %
Brady Statistic RA Percent Paced: 1 %
Brady Statistic RV Percent Paced: 95 %
Date Time Interrogation Session: 20200929080024
Implantable Lead Implant Date: 20090903
Implantable Lead Implant Date: 20090903
Implantable Lead Location: 753859
Implantable Lead Location: 753860
Implantable Pulse Generator Implant Date: 20200224
Lead Channel Impedance Value: 430 Ohm
Lead Channel Impedance Value: 560 Ohm
Lead Channel Pacing Threshold Amplitude: 0.75 V
Lead Channel Pacing Threshold Amplitude: 0.875 V
Lead Channel Pacing Threshold Pulse Width: 0.5 ms
Lead Channel Pacing Threshold Pulse Width: 0.5 ms
Lead Channel Sensing Intrinsic Amplitude: 1.8 mV
Lead Channel Sensing Intrinsic Amplitude: 12 mV
Lead Channel Setting Pacing Amplitude: 1 V
Lead Channel Setting Pacing Amplitude: 1.875
Lead Channel Setting Pacing Pulse Width: 0.5 ms
Lead Channel Setting Sensing Sensitivity: 4 mV
Pulse Gen Model: 2272
Pulse Gen Serial Number: 9107097

## 2019-06-18 DIAGNOSIS — N183 Chronic kidney disease, stage 3 (moderate): Secondary | ICD-10-CM | POA: Diagnosis not present

## 2019-06-18 DIAGNOSIS — I480 Paroxysmal atrial fibrillation: Secondary | ICD-10-CM | POA: Diagnosis not present

## 2019-06-18 DIAGNOSIS — I5032 Chronic diastolic (congestive) heart failure: Secondary | ICD-10-CM | POA: Diagnosis not present

## 2019-06-18 DIAGNOSIS — Z79899 Other long term (current) drug therapy: Secondary | ICD-10-CM | POA: Diagnosis not present

## 2019-06-25 DIAGNOSIS — I4811 Longstanding persistent atrial fibrillation: Secondary | ICD-10-CM | POA: Diagnosis not present

## 2019-06-25 DIAGNOSIS — I5032 Chronic diastolic (congestive) heart failure: Secondary | ICD-10-CM | POA: Diagnosis not present

## 2019-06-25 DIAGNOSIS — N183 Chronic kidney disease, stage 3 unspecified: Secondary | ICD-10-CM | POA: Diagnosis not present

## 2019-06-26 NOTE — Progress Notes (Signed)
Remote pacemaker transmission.   

## 2019-06-30 DIAGNOSIS — M316 Other giant cell arteritis: Secondary | ICD-10-CM | POA: Diagnosis not present

## 2019-07-01 DIAGNOSIS — M316 Other giant cell arteritis: Secondary | ICD-10-CM | POA: Diagnosis not present

## 2019-07-01 DIAGNOSIS — M353 Polymyalgia rheumatica: Secondary | ICD-10-CM | POA: Diagnosis not present

## 2019-07-03 ENCOUNTER — Other Ambulatory Visit (HOSPITAL_COMMUNITY)
Admission: RE | Admit: 2019-07-03 | Discharge: 2019-07-03 | Disposition: A | Discharge status: Home / Self Care | Payer: Medicare Other | Source: Ambulatory Visit | Attending: General Surgery | Admitting: General Surgery

## 2019-07-03 ENCOUNTER — Encounter: Payer: Self-pay | Admitting: General Surgery

## 2019-07-03 ENCOUNTER — Other Ambulatory Visit: Payer: Self-pay

## 2019-07-03 ENCOUNTER — Encounter (HOSPITAL_COMMUNITY)
Admission: RE | Admit: 2019-07-03 | Discharge: 2019-07-03 | Disposition: A | Discharge status: Home / Self Care | Payer: Medicare Other | Source: Ambulatory Visit | Attending: General Surgery | Admitting: General Surgery

## 2019-07-03 ENCOUNTER — Ambulatory Visit: Payer: Medicare Other | Admitting: General Surgery

## 2019-07-03 ENCOUNTER — Encounter (HOSPITAL_COMMUNITY): Payer: Self-pay

## 2019-07-03 ENCOUNTER — Encounter (INDEPENDENT_AMBULATORY_CARE_PROVIDER_SITE_OTHER): Payer: Self-pay

## 2019-07-03 VITALS — BP 141/79 | HR 73 | Temp 96.2°F | Resp 16 | Ht 68.0 in | Wt 250.0 lb

## 2019-07-03 DIAGNOSIS — Z20828 Contact with and (suspected) exposure to other viral communicable diseases: Secondary | ICD-10-CM | POA: Diagnosis not present

## 2019-07-03 DIAGNOSIS — M316 Other giant cell arteritis: Secondary | ICD-10-CM

## 2019-07-03 DIAGNOSIS — Z01812 Encounter for preprocedural laboratory examination: Secondary | ICD-10-CM | POA: Insufficient documentation

## 2019-07-03 LAB — SARS CORONAVIRUS 2 (TAT 6-24 HRS): SARS Coronavirus 2: NEGATIVE

## 2019-07-03 NOTE — Progress Notes (Signed)
[name].; [id number]; 03/28/1941   HPI Patient is a 78 year old white male who was referred to my care by Dr. Willey Blade for a right temporal artery biopsy.  Patient was recently started on steroids due to persistent right-sided headaches.  His ongoing therapy is conditional on the biopsy results.  Patient currently has 1 out of 10 pain.  He is on Eliquis for paroxysmal atrial fibrillation.  A pacemaker is in place. Past Medical History:  Diagnosis Date  . Atrial flutter ([place])    a. s/p ablation 01/2015.  Marland Kitchen BPH (benign prostatic hyperplasia)   . Cerebrovascular disease    carotid bruit; no focal stenosis by carotid ultrasound in 05/2008  . Congestive heart failure (CHF) ([place])   . Dysrhythmia   . General weakness    +malaise, exercise intolerance, and near syncope  . Hypertension   . Low back pain    history of traumax2  . Paroxysmal atrial fibrillation (HCC)    on Eliquis  . Presence of permanent cardiac pacemaker 2009  . Sleep apnea    c pap at night  . Tachy-brady syndrome ([place])    a. s/p St. Jude pacemaker 2009.    Past Surgical History:  Procedure Laterality Date  . ABLATION    . CATARACT EXTRACTION     Bilateral  . CERVICAL DISCECTOMY  2006  . COLONOSCOPY WITH PROPOFOL N/A 01/26/2017   Procedure: COLONOSCOPY WITH PROPOFOL;  Surgeon: [name], MD;  [facility];  Service: Endoscopy;  Laterality: N/A;  8:30  . ELECTROPHYSIOLOGIC STUDY N/A 01/18/2015   Procedure: A-Flutter/A-Tach/Svt Ablation;  Surgeon: [name], MD;  Location: [facility];  Service: Cardiovascular;  Laterality: N/A;  . KNEE ARTHROSCOPY WITH MEDIAL MENISECTOMY Left 06/03/2015   Procedure: KNEE ARTHROSCOPY WITH MEDIAL MENISECTOMY;  Surgeon: [name], MD;  Location[facility];  Service: Orthopedics;  Laterality: Left;  . PACEMAKER INSERTION  1966  . POLYPECTOMY  01/26/2017   Procedure: POLYPECTOMY;  Surgeon: [name], MD;  [facility];  Service:  Endoscopy;;  colon  . PPM GENERATOR CHANGEOUT N/A 11/11/2018   Procedure: PPM GENERATOR CHANGEOUT;  Surgeon: [name], MD;  Location: [facility];  Service: Cardiovascular;  Laterality: N/A;    Family History  Problem Relation Age of Onset  . Hypertension Other   . Neuropathy Neg Hx   . Stroke Neg Hx     Current Outpatient Medications on File Prior to Visit  Medication Sig Dispense Refill  . acetaminophen (TYLENOL) 500 MG tablet Take 1,000 mg by mouth 2 (two) times daily.    Marland Kitchen antiseptic oral rinse (BIOTENE) LIQD 15 mLs by Mouth Rinse route as needed for dry mouth.    Marland Kitchen apixaban (ELIQUIS) 5 MG TABS tablet Take 1 tablet (5 mg total) by mouth 2 (two) times daily. 60 tablet 1  . CALCIUM-MAGNESIUM-VITAMIN D PO Take 1 tablet by mouth at bedtime.     Marland Kitchen diltiazem (TIAZAC) 120 MG 24 hr capsule Take 120 mg by mouth every evening.     . diltiazem (TIAZAC) 300 MG 24 hr capsule Take 300 mg by mouth daily.     . furosemide (LASIX) 40 MG tablet Take 1 tablet (40 mg total) by mouth 2 (two) times daily. (Patient taking differently: Take 40-80 mg by mouth 2 (two) times daily. Take 2 tablets (80 mg) by mouth in the morning & take 1 tablet (40 mg) by mouth in the evening.) 60 tablet  0  . gabapentin (NEURONTIN) 300 MG capsule Take 1,200 mg by mouth 2 (two) times daily.     . hydrocortisone (ANUSOL-HC) 2.5 % rectal cream Place 1 application rectally as needed for hemorrhoids (after each bowel movement).     Marland Kitchen ketoconazole (NIZORAL) 2 % cream Apply 1 application topically daily as needed (skin irritation).     Marland Kitchen lidocaine (LIDODERM) 5 % Place 1 patch onto the skin daily as needed (back pain.). Remove & Discard patch within 12 hours or as directed by MD    . lidocaine (XYLOCAINE) 5 % ointment Apply 1 application topically 3 (three) times daily as needed (back pain.).    Marland Kitchen metoprolol tartrate (LOPRESSOR) 25 MG tablet Take 0.5 tablets (12.5 mg total) by mouth 2 (two) times daily. 90 tablet 3  .  pantoprazole (PROTONIX) 40 MG tablet Take 40 mg by mouth daily.    . Tamsulosin HCl (FLOMAX) 0.4 MG CAPS Take 0.4 mg by mouth daily.     . traMADol (ULTRAM) 50 MG tablet Take by mouth every 6 (six) hours as needed.    . zinc oxide (BALMEX) 11.3 % CREA cream Apply 1 application topically 2 (two) times daily as needed (skin breakdown).      No current facility-administered medications on file prior to visit.     Allergies  Allergen Reactions  . Bee Venom Anaphylaxis and Swelling  . Shellfish Allergy Hives    Accelerated allergic reaction; questionable laryngospasm or laryngeal edema with some difficulty breathing prompting ED evaluation and [facility] at [facility]  . Penicillins Rash    Has patient had a PCN reaction causing immediate rash, facial/tongue/throat swelling, SOB or lightheadedness with hypotension: No Has patient had a PCN reaction causing severe rash involving mucus membranes or skin necrosis: No Has patient had a PCN reaction that required hospitalization No Has patient had a PCN reaction occurring within the last 10 years: No If all of the above answers are "NO", then may proceed with Cephalosporin use.     Social History   Substance and Sexual Activity  Alcohol Use No  . Alcohol/week: 0.0 standard drinks    Social History   Tobacco Use  Smoking Status Former Smoker  . Packs/day: 1.00  . Years: 0.50  . Pack years: 0.50  . Types: Cigarettes  . Quit date: 03/27/1962  . Years since quitting: 57.3  Smokeless Tobacco Never Used    Review of Systems  Constitutional: Positive for malaise/fatigue.  HENT: Positive for sinus pain.   Eyes: Negative.   Respiratory: Positive for shortness of breath.   Cardiovascular: Negative.   Gastrointestinal: Negative.   Genitourinary: Negative.   Musculoskeletal: Positive for back pain, joint pain and neck pain.  Skin: Negative.   Neurological: Positive for headaches.  Endo/Heme/Allergies: Bruises/bleeds easily.   Psychiatric/Behavioral: Negative.     Objective   Vitals:   07/03/19 1017  BP: (!) 141/79  Pulse: 73  Resp: 16  Temp: (!) 96.2 F (35.7 C)  SpO2: 94%    Physical Exam Vitals signs reviewed.  Constitutional:      Appearance: Normal appearance. He is not ill-appearing.  HENT:     Head: Normocephalic and atraumatic.     Comments: Right temporal artery easily palpable. Cardiovascular:     Rate and Rhythm: Normal rate and regular rhythm.     Heart sounds: No murmur. No friction rub. No gallop.      Comments: 2 out of 6 systolic ejection murmur Pulmonary:     Effort:  Pulmonary effort is normal. No respiratory distress.     Breath sounds: Normal breath sounds. No stridor. No wheezing, rhonchi or rales.  Skin:    General: Skin is warm and dry.  Neurological:     Mental Status: He is alert and oriented to person, place, and time.   Primary care notes reviewed  Assessment  Headaches, possible right temporal arteritis Plan   Patient scheduled for right temporal artery biopsy on 07/07/2019.  The risks and benefits of the procedure including bleeding and infection were fully explained to the patient, who gave informed consent.  He is to stop his Eliquis 2 days before the procedure.

## 2019-07-03 NOTE — Patient Instructions (Signed)
Stop eliquis two days before procedure  Temporal Artery Biopsy  Arteries are blood vessels that carry blood from the heart to the rest of the body. Temporal arteries are found on the side of the head and between the ears and eyes. During a temporal artery biopsy, a sample of the temporal artery is removed and examined under a microscope. This procedure may be done to see if you have a condition that causes your arteries to become swollen or inflamed (temporal arteritis or giant cell arteritis). Tell a health care provider about:  Any allergies you have.  All medicines you are taking, including vitamins, herbs, eye drops, creams, and over-the-counter medicines.  Any problems you or family members have had with anesthetic medicines.  Any blood disorders you have.  Any surgeries you have had.  Any medical conditions you have or have had.  Whether you are pregnant or may be pregnant. What are the risks? Generally, this is a safe procedure. However, problems may occur, including:  Bleeding.  A collection of blood under the skin (hematoma).  Infection.  Nerve damage in the temples. This can cause numbness or make the muscles in your face weak.  Scarring. On the scalp, hair may not grow around the scar. What happens before the procedure?  You may have blood tests to make sure that your blood clots normally.  Ask your health care provider about: ? Changing or stopping your regular medicines. This is especially important if you are taking diabetes medicines or blood thinners. ? Taking medicines such as aspirin and ibuprofen. These medicines can thin your blood. Do not take these medicines unless your health care provider tells you to take them. ? Taking over-the-counter medicines, vitamins, herbs, and supplements.  Follow instructions from your health care provider about eating or drinking restrictions.  Plan to have someone take you home from the hospital or clinic.  If you will  be going home right after the procedure, plan to have someone with you for 24 hours.  Ask your health care provider: ? How your surgery site will be marked. ? What steps will be taken to help prevent infection. These may include:  Removing hair at the surgery site.  Washing skin with a germ-killing soap.  Taking antibiotic medicine. What happens during the procedure?  Small monitors will be put on your body. They will be used to check your heart, blood pressure, and oxygen level.  An IV will be inserted into one of your veins.  You will be given one or more of the following: ? A medicine to help you relax (sedative). ? A medicine to numb the area (local anesthetic).  A tool called a Doppler may be used to find the artery.  An incision will be made over the temporal artery.  Two clamps will be placed on the artery. Then, a small piece of the artery between the clamps will be cut and removed.  The remaining ends of the artery will be closed with stitches (sutures) to avoid bleeding. The clamps will then be removed.  The incision will be closed with sutures.  A bandage (dressing) may be placed on the incision.  The artery piece that was taken out will be checked under a microscope. The procedure may vary among health care providers and hospitals. What happens after the procedure?  Your blood pressure, heart rate, breathing rate, and blood oxygen level may be monitored until you leave the hospital or clinic.  You may be given medicine for  pain.  Do not drive for 24 hours if you were given a sedative during your procedure. Summary  During a temporal artery biopsy, a sample of the temporal artery is removed and examined under a microscope. This procedure may be done to see if you have a condition that causes your arteries to become swollen or inflamed (temporal arteritis or giant cell arteritis).  Before the procedure, follow instructions from your health care provider about  changing or stopping your medicines and about any eating or drinking restrictions.  Plan to have someone take you home after the procedure and have someone with you for 24 hours.  During the procedure, the incision will be closed with sutures, and a bandage (dressing) may be placed on the incision. This information is not intended to replace advice given to you by your health care provider. Make sure you discuss any questions you have with your health care provider. Document Released: 08/23/2009 Document Revised: 05/29/2018 Document Reviewed: 05/29/2018 Elsevier Patient Education  2020 [employer].

## 2019-07-03 NOTE — Patient Instructions (Signed)
[name].  07/03/2019     @PREFPERIOPPHARMACY @   Your procedure is scheduled on  07/07/2019   Report to [facility] at  58   A.M.  Call this number if you have problems the morning of surgery:  [phone]   Remember:  Do not eat or drink after midnight.                       Take these medicines the morning of surgery with A SIP OF WATER  Tiazac, gabapentin, metoprolol, protonix, flomax, tramadol(if needed).    Do not wear jewelry, make-up or nail polish.  Do not wear lotions, powders, or perfumes. Please wear deodorant and brush your teeth.  Do not shave 48 hours prior to surgery.  Men may shave face and neck.  Do not bring valuables to the hospital.  [facility] is not responsible for any belongings or valuables.  Contacts, dentures or bridgework may not be worn into surgery.  Leave your suitcase in the car.  After surgery it may be brought to your room.  For patients admitted to the hospital, discharge time will be determined by your treatment team.  Patients discharged the day of surgery will not be allowed to drive home.   Name and phone number of your driver:   family Special instructions:  Follow any instructions given to you concerning your eliquis.  Please read over the following fact sheets that you were given. Anesthesia Post-op Instructions and Care and Recovery After Surgery       Temporal Artery Biopsy, Care After This sheet gives you information about how to care for yourself after your procedure. Your health care provider may also give you more specific instructions. If you have problems or questions, contact your health care provider. What can I expect after the procedure? After the procedure, it is common to have:  Soreness.  Bruising.  Numbness.  Swelling. Follow these instructions at home: Incision care   Follow instructions from your health care provider about how to take care of your incision. Make sure  you: ? Wash your hands with soap and water before and after you change your bandage (dressing). If soap and water are not available, use hand sanitizer. ? Change your dressing as told by your health care provider. ? Leave stitches (sutures), skin glue, or adhesive strips in place. These skin closures may need to stay in place for 2 weeks or longer. If adhesive strip edges start to loosen and curl up, you may trim the loose edges. Do not remove adhesive strips completely unless your health care provider tells you to do that.  Check your incision area every day for signs of infection. Check for: ? Redness, swelling, or pain. ? Fluid or blood. ? Warmth. ? Pus or a bad smell. Activity  Do not do any physical work or strenuous exercise until your health care provider approves.  Do not lift anything that is heavier than 10 lb (4.5 kg), or the limit that you are told, until your health care provider says that it is safe. General instructions   Take over-the-counter and prescription medicines only as told by your health care provider. Do not take aspirin or other NSAIDs unless your health care provider tells you to take them. Aspirin may increase your risk of bleeding at the incision site.  Do not drive for 24 hours if you were given a sedative  during your procedure.  Follow your health care provider's instructions on bathing or showering.  Keep all follow-up visits as told by your health care provider. This is important. Contact a health care provider if:  Medicine does not help your pain.  You have chills.  You have redness, swelling, or pain around your incision site.  You have fluid or blood coming from your incision site.  Your incision feels warm to the touch.  You have pus or a bad smell coming from your incision site.  You have a fever.  You have nausea or vomiting. Get help right away if:  You have bleeding from the incision that does not stop after 30 minutes of applying  heavy pressure.  You have chest pain.  You have shortness of breath.  You faint.  You have sudden vision loss.  You have weakness or drooping in your face or eye. Summary  Follow instructions from your health care provider about how to take care of your incision.  Do not do any physical work or strenuous exercise until your health care provider approves.  Take over-the-counter and prescription medicines only as told by your health care provider.  Contact a health care provider if you have signs of infection at your incision.  Keep all follow-up visits as told by your health care provider. This is important. This information is not intended to replace advice given to you by your health care provider. Make sure you discuss any questions you have with your health care provider. Document Released: 10/01/2015 Document Revised: 05/29/2018 Document Reviewed: 05/29/2018 Elsevier Patient Education  2020 [place] After These instructions provide you with information about caring for yourself after your procedure. Your health care provider may also give you more specific instructions. Your treatment has been planned according to current medical practices, but problems sometimes occur. Call your health care provider if you have any problems or questions after your procedure. What can I expect after the procedure? After your procedure, you may:  Feel sleepy for several hours.  Feel clumsy and have poor balance for several hours.  Feel forgetful about what happened after the procedure.  Have poor judgment for several hours.  Feel nauseous or vomit.  Have a sore throat if you had a breathing tube during the procedure. Follow these instructions at home: For at least 24 hours after the procedure:      Have a responsible adult stay with you. It is important to have someone help care for you until you are awake and alert.  Rest as needed.  Do  not: ? Participate in activities in which you could fall or become injured. ? Drive. ? Use heavy machinery. ? Drink alcohol. ? Take sleeping pills or medicines that cause drowsiness. ? Make important decisions or sign legal documents. ? Take care of children on your own. Eating and drinking  Follow the diet that is recommended by your health care provider.  If you vomit, drink water, juice, or soup when you can drink without vomiting.  Make sure you have little or no nausea before eating solid foods. General instructions  Take over-the-counter and prescription medicines only as told by your health care provider.  If you have sleep apnea, surgery and certain medicines can increase your risk for breathing problems. Follow instructions from your health care provider about wearing your sleep device: ? Anytime you are sleeping, including during daytime naps. ? While taking prescription pain medicines, sleeping medicines, or  medicines that make you drowsy.  If you smoke, do not smoke without supervision.  Keep all follow-up visits as told by your health care provider. This is important. Contact a health care provider if:  You keep feeling nauseous or you keep vomiting.  You feel light-headed.  You develop a rash.  You have a fever. Get help right away if:  You have trouble breathing. Summary  For several hours after your procedure, you may feel sleepy and have poor judgment.  Have a responsible adult stay with you for at least 24 hours or until you are awake and alert. This information is not intended to replace advice given to you by your health care provider. Make sure you discuss any questions you have with your health care provider. Document Released: 12/26/2015 Document Revised: 12/03/2017 Document Reviewed: 12/26/2015 Elsevier Patient Education  2020 [employer]. How to Use Chlorhexidine for Bathing Chlorhexidine gluconate (CHG) is a germ-killing (antiseptic) solution  that is used to clean the skin. It can get rid of the bacteria that normally live on the skin and can keep them away for about 24 hours. To clean your skin with CHG, you may be given:  A CHG solution to use in the shower or as part of a sponge bath.  A prepackaged cloth that contains CHG. Cleaning your skin with CHG may help lower the risk for infection:  While you are staying in the intensive care unit of the hospital.  If you have a vascular access, such as a central line, to provide short-term or long-term access to your veins.  If you have a catheter to drain urine from your bladder.  If you are on a ventilator. A ventilator is a machine that helps you breathe by moving air in and out of your lungs.  After surgery. What are the risks? Risks of using CHG include:  A skin reaction.  Hearing loss, if CHG gets in your ears.  Eye injury, if CHG gets in your eyes and is not rinsed out.  The CHG product catching fire. Make sure that you avoid smoking and flames after applying CHG to your skin. Do not use CHG:  If you have a chlorhexidine allergy or have previously reacted to chlorhexidine.  On babies younger than 25 months of age. How to use CHG solution  Use CHG only as told by your health care provider, and follow the instructions on the label.  Use the full amount of CHG as directed. Usually, this is one bottle. During a shower Follow these steps when using CHG solution during a shower (unless your health care provider gives you different instructions): 1. Start the shower. 2. Use your normal soap and shampoo to wash your face and hair. 3. Turn off the shower or move out of the shower stream. 4. Pour the CHG onto a clean washcloth. Do not use any type of brush or rough-edged sponge. 5. Starting at your neck, lather your body down to your toes. Make sure you follow these instructions: ? If you will be having surgery, pay special attention to the part of your body where you  will be having surgery. Scrub this area for at least 1 minute. ? Do not use CHG on your head or face. If the solution gets into your ears or eyes, rinse them well with water. ? Avoid your genital area. ? Avoid any areas of skin that have broken skin, cuts, or scrapes. ? Scrub your back and under your arms. Make  sure to wash skin folds. 6. Let the lather sit on your skin for 1-2 minutes or as long as told by your health care provider. 7. Thoroughly rinse your entire body in the shower. Make sure that all body creases and crevices are rinsed well. 8. Dry off with a clean towel. Do not put any substances on your body afterward--such as powder, lotion, or perfume--unless you are told to do so by your health care provider. Only use lotions that are recommended by the manufacturer. 9. Put on clean clothes or pajamas. 10. If it is the night before your surgery, sleep in clean sheets.  During a sponge bath Follow these steps when using CHG solution during a sponge bath (unless your health care provider gives you different instructions): 1. Use your normal soap and shampoo to wash your face and hair. 2. Pour the CHG onto a clean washcloth. 3. Starting at your neck, lather your body down to your toes. Make sure you follow these instructions: ? If you will be having surgery, pay special attention to the part of your body where you will be having surgery. Scrub this area for at least 1 minute. ? Do not use CHG on your head or face. If the solution gets into your ears or eyes, rinse them well with water. ? Avoid your genital area. ? Avoid any areas of skin that have broken skin, cuts, or scrapes. ? Scrub your back and under your arms. Make sure to wash skin folds. 4. Let the lather sit on your skin for 1-2 minutes or as long as told by your health care provider. 5. Using a different clean, wet washcloth, thoroughly rinse your entire body. Make sure that all body creases and crevices are rinsed well. 6. Dry  off with a clean towel. Do not put any substances on your body afterward--such as powder, lotion, or perfume--unless you are told to do so by your health care provider. Only use lotions that are recommended by the manufacturer. 7. Put on clean clothes or pajamas. 8. If it is the night before your surgery, sleep in clean sheets. How to use CHG prepackaged cloths  Only use CHG cloths as told by your health care provider, and follow the instructions on the label.  Use the CHG cloth on clean, dry skin.  Do not use the CHG cloth on your head or face unless your health care provider tells you to.  When washing with the CHG cloth: ? Avoid your genital area. ? Avoid any areas of skin that have broken skin, cuts, or scrapes. Before surgery Follow these steps when using a CHG cloth to clean before surgery (unless your health care provider gives you different instructions): 1. Using the CHG cloth, vigorously scrub the part of your body where you will be having surgery. Scrub using a back-and-forth motion for 3 minutes. The area on your body should be completely wet with CHG when you are done scrubbing. 2. Do not rinse. Discard the cloth and let the area air-dry. Do not put any substances on the area afterward, such as powder, lotion, or perfume. 3. Put on clean clothes or pajamas. 4. If it is the night before your surgery, sleep in clean sheets.  For general bathing Follow these steps when using CHG cloths for general bathing (unless your health care provider gives you different instructions). 1. Use a separate CHG cloth for each area of your body. Make sure you wash between any folds of skin and between  your fingers and toes. Wash your body in the following order, switching to a new cloth after each step: ? The front of your neck, shoulders, and chest. ? Both of your arms, under your arms, and your hands. ? Your stomach and groin area, avoiding the genitals. ? Your right leg and foot. ? Your left  leg and foot. ? The back of your neck, your back, and your buttocks. 2. Do not rinse. Discard the cloth and let the area air-dry. Do not put any substances on your body afterward--such as powder, lotion, or perfume--unless you are told to do so by your health care provider. Only use lotions that are recommended by the manufacturer. 3. Put on clean clothes or pajamas. Contact a health care provider if:  Your skin gets irritated after scrubbing.  You have questions about using your solution or cloth. Get help right away if:  Your eyes become very red or swollen.  Your eyes itch badly.  Your skin itches badly and is red or swollen.  Your hearing changes.  You have trouble seeing.  You have swelling or tingling in your mouth or throat.  You have trouble breathing.  You swallow any chlorhexidine. Summary  Chlorhexidine gluconate (CHG) is a germ-killing (antiseptic) solution that is used to clean the skin. Cleaning your skin with CHG may help to lower your risk for infection.  You may be given CHG to use for bathing. It may be in a bottle or in a prepackaged cloth to use on your skin. Carefully follow your health care provider's instructions and the instructions on the product label.  Do not use CHG if you have a chlorhexidine allergy.  Contact your health care provider if your skin gets irritated after scrubbing. This information is not intended to replace advice given to you by your health care provider. Make sure you discuss any questions you have with your health care provider. Document Released: 05/29/2012 Document Revised: 11/21/2018 Document Reviewed: 08/02/2017 Elsevier Patient Education  2020 [employer].

## 2019-07-03 NOTE — H&P (Signed)
[name].; [id number]; Mar 16, 1941   HPI Patient is a 78 year old white male who was referred to my care by Dr. Willey Blade for a right temporal artery biopsy.  Patient was recently started on steroids due to persistent right-sided headaches.  His ongoing therapy is conditional on the biopsy results.  Patient currently has 1 out of 10 pain.  He is on Eliquis for paroxysmal atrial fibrillation.  A pacemaker is in place. Past Medical History:  Diagnosis Date  . Atrial flutter ([name])    a. s/p ablation 01/2015.  Marland Kitchen BPH (benign prostatic hyperplasia)   . Cerebrovascular disease    carotid bruit; no focal stenosis by carotid ultrasound in 05/2008  . Congestive heart failure (CHF) ([place])   . Dysrhythmia   . General weakness    +malaise, exercise intolerance, and near syncope  . Hypertension   . Low back pain    history of traumax2  . Paroxysmal atrial fibrillation (H[facility])    on Eliquis  . Presence of permanent cardiac pacemaker 2009  . Sleep apnea    c pap at night  . Tachy-brady syndrome ([place])    a. s/p [employer] Jude pacemaker 2009.    Past Surgical History:  Procedure Laterality Date  . ABLATION    . CATARACT EXTRACTION     Bilateral  . CERVICAL DISCECTOMY  2006  . COLONOSCOPY WITH PROPOFOL N/A 01/26/2017   Procedure: COLONOSCOPY WITH PROPOFOL;  Surgeon: [name], MD;  Location: [facility];  Service: Endoscopy;  Laterality: N/A;  8:30  . ELECTROPHYSIOLOGIC STUDY N/A 01/18/2015   Procedure: A-Flutter/A-Tach/Svt Ablation;  Surgeon: [name], MD;  Location: [place] IN[facility]ASIVE CV LAB CUPID;  Service: Cardiovascular;  Laterality: N/A;  . KNEE ARTHROSCOPY WITH MEDIAL MENISECTOMY Left 06/03/2015   Procedure: KNEE ARTHROSCOPY WITH MEDIAL MENISECTOMY;  Surgeon: [name], MD;  Location: [facility] OR[facility];  Service: Orthopedics;  Laterality: Left;  . PACEMAKER INSERTION  1966  . POLYPECTOMY  01/26/2017   Procedure: POLYPECTOMY;  Surgeon: [name], MD;  Location: [facility];  Service:  Endoscopy;;  colon  . PPM GENERATOR CHANGEOUT N/A 11/11/2018   Procedure: PPM GENERATOR CHANGEOUT;  Surgeon: [name], MD;  Location: [place] CV LAB;  Service: Cardiovascular;  Laterality: N/A;    Family History  Problem Relation Age of Onset  . Hypertension Other   . Neuropathy Neg Hx   . Stroke Neg Hx     Current Outpatient Medications on File Prior to Visit  Medication Sig Dispense Refill  . acetaminophen (TYLENOL) 500 MG tablet Take 1,000 mg by mouth 2 (two) times daily.    Marland Kitchen antiseptic oral rinse (BIOTENE) LIQD 15 mLs by Mouth Rinse route as needed for dry mouth.    Marland Kitchen apixaban (ELIQUIS) 5 MG TABS tablet Take 1 tablet (5 mg total) by mouth 2 (two) times daily. 60 tablet 1  . CALCIUM-MAGNESIUM-VITAMIN D PO Take 1 tablet by mouth at bedtime.     Marland Kitchen diltiazem (TIAZAC) 120 MG 24 hr capsule Take 120 mg by mouth every evening.     . diltiazem (TIAZAC) 300 MG 24 hr capsule Take 300 mg by mouth daily.     . furosemide (LASIX) 40 MG tablet Take 1 tablet (40 mg total) by mouth 2 (two) times daily. (Patient taking differently: Take 40-80 mg by mouth 2 (two) times daily. Take 2 tablets (80 mg) by mouth in the morning & take 1 tablet (40 mg) by mouth in the evening.) 60 tablet  0  . gabapentin (NEURONTIN) 300 MG capsule Take 1,200 mg by mouth 2 (two) times daily.     . hydrocortisone (ANUSOL-HC) 2.5 % rectal cream Place 1 application rectally as needed for hemorrhoids (after each bowel movement).     Marland Kitchen ketoconazole (NIZORAL) 2 % cream Apply 1 application topically daily as needed (skin irritation).     Marland Kitchen lidocaine (LIDODERM) 5 % Place 1 patch onto the skin daily as needed (back pain.). Remove & Discard patch within 12 hours or as directed by MD    . lidocaine (XYLOCAINE) 5 % ointment Apply 1 application topically 3 (three) times daily as needed (back pain.).    Marland Kitchen metoprolol tartrate (LOPRESSOR) 25 MG tablet Take 0.5 tablets (12.5 mg total) by mouth 2 (two) times daily. 90 tablet 3  .  pantoprazole (PROTONIX) 40 MG tablet Take 40 mg by mouth daily.    . Tamsulosin HCl (FLOMAX) 0.4 MG CAPS Take 0.4 mg by mouth daily.     . traMADol (ULTRAM) 50 MG tablet Take by mouth every 6 (six) hours as needed.    . zinc oxide (BALMEX) 11.3 % CREA cream Apply 1 application topically 2 (two) times daily as needed (skin breakdown).      No current facility-administered medications on file prior to visit.     Allergies  Allergen Reactions  . Bee Venom Anaphylaxis and Swelling  . Shellfish Allergy Hives    Accelerated allergic reaction; questionable laryngospasm or laryngeal edema with some difficulty breathing prompting ED evaluation and treatment at [facility]  . Penicillins Rash    Has patient had a PCN reaction causing immediate rash, facial/tongue/throat swelling, SOB or lightheadedness with hypotension: No Has patient had a PCN reaction causing severe rash involving mucus membranes or skin necrosis: No Has patient had a PCN reaction that required hospitalization No Has patient had a PCN reaction occurring within the last 10 years: No If all of the above answers are "NO", then may proceed with Cephalosporin use.     Social History   Substance and Sexual Activity  Alcohol Use No  . Alcohol/week: 0.0 standard drinks    Social History   Tobacco Use  Smoking Status Former Smoker  . Packs/day: 1.00  . Years: 0.50  . Pack years: 0.50  . Types: Cigarettes  . Quit date: 03/27/1962  . Years since quitting: 57.3  Smokeless Tobacco Never Used    Review of Systems  Constitutional: Positive for malaise/fatigue.  HENT: Positive for sinus pain.   Eyes: Negative.   Respiratory: Positive for shortness of breath.   Cardiovascular: Negative.   Gastrointestinal: Negative.   Genitourinary: Negative.   Musculoskeletal: Positive for back pain, joint pain and neck pain.  Skin: Negative.   Neurological: Positive for headaches.  Endo/Heme/Allergies: Bruises/bleeds easily.   Psychiatric/Behavioral: Negative.     Objective   Vitals:   07/03/19 1017  BP: (!) 141/79  Pulse: 73  Resp: 16  Temp: (!) 96.2 F (35.7 C)  SpO2: 94%    Physical Exam Vitals signs reviewed.  Constitutional:      Appearance: Normal appearance. He is not ill-appearing.  HENT:     Head: Normocephalic and atraumatic.     Comments: Right temporal artery easily palpable. Cardiovascular:     Rate and Rhythm: Normal rate and regular rhythm.     Heart sounds: No murmur. No friction rub. No gallop.      Comments: 2 out of 6 systolic ejection murmur Pulmonary:     Effort:  Pulmonary effort is normal. No respiratory distress.     Breath sounds: Normal breath sounds. No stridor. No wheezing, rhonchi or rales.  Skin:    General: Skin is warm and dry.  Neurological:     Mental Status: He is alert and oriented to person, place, and time.   Primary care notes reviewed  Assessment  Headaches, possible right temporal arteritis Plan   Patient scheduled for right temporal artery biopsy on 07/07/2019.  The risks and benefits of the procedure including bleeding and infection were fully explained to the patient, who gave informed consent.  He is to stop his Eliquis 2 days before the procedure.

## 2019-07-04 ENCOUNTER — Telehealth: Payer: Self-pay | Admitting: Internal Medicine

## 2019-07-04 NOTE — Telephone Encounter (Signed)
Pt says he is having a biopsy of the temporal artery for severe headaches.... he was asked by the surgeon.. Dr. Arnoldo Morale to stop his Eliquis starting tomorrow for his surgery Monday 07/07/19. He was asking if that is okay and I advised him that he should follow Dr. Arnoldo Morale instructions since he has not reached out to [place] for our clearance.. he will call Dr. Arnoldo Morale if he has any questions but I advised him to call soon since it is almost 5 pm and the procedure is 07/07/19. Pt agreed.

## 2019-07-04 NOTE — Telephone Encounter (Signed)
New message:    Patient calling concerning stopping some medication. Please call patient.

## 2019-07-07 ENCOUNTER — Other Ambulatory Visit: Payer: Self-pay

## 2019-07-07 ENCOUNTER — Ambulatory Visit (HOSPITAL_COMMUNITY): Payer: Medicare Other | Admitting: Anesthesiology

## 2019-07-07 ENCOUNTER — Encounter (HOSPITAL_COMMUNITY)
Admission: RE | Disposition: A | Discharge status: Home / Self Care | Payer: Self-pay | Source: Home / Self Care | Attending: General Surgery

## 2019-07-07 ENCOUNTER — Ambulatory Visit (HOSPITAL_COMMUNITY)
Admission: RE | Admit: 2019-07-07 | Discharge: 2019-07-07 | Disposition: A | Discharge status: Home / Self Care | Payer: Medicare Other | Attending: General Surgery | Admitting: General Surgery

## 2019-07-07 DIAGNOSIS — Z950 Presence of cardiac pacemaker: Secondary | ICD-10-CM | POA: Diagnosis not present

## 2019-07-07 DIAGNOSIS — Z880 Allergy status to penicillin: Secondary | ICD-10-CM | POA: Insufficient documentation

## 2019-07-07 DIAGNOSIS — R0989 Other specified symptoms and signs involving the circulatory and respiratory systems: Secondary | ICD-10-CM | POA: Insufficient documentation

## 2019-07-07 DIAGNOSIS — Z9841 Cataract extraction status, right eye: Secondary | ICD-10-CM | POA: Insufficient documentation

## 2019-07-07 DIAGNOSIS — Z87891 Personal history of nicotine dependence: Secondary | ICD-10-CM | POA: Diagnosis not present

## 2019-07-07 DIAGNOSIS — R531 Weakness: Secondary | ICD-10-CM | POA: Diagnosis not present

## 2019-07-07 DIAGNOSIS — I495 Sick sinus syndrome: Secondary | ICD-10-CM | POA: Diagnosis not present

## 2019-07-07 DIAGNOSIS — G4730 Sleep apnea, unspecified: Secondary | ICD-10-CM | POA: Insufficient documentation

## 2019-07-07 DIAGNOSIS — M316 Other giant cell arteritis: Secondary | ICD-10-CM | POA: Diagnosis not present

## 2019-07-07 DIAGNOSIS — Z7901 Long term (current) use of anticoagulants: Secondary | ICD-10-CM | POA: Insufficient documentation

## 2019-07-07 DIAGNOSIS — R519 Headache, unspecified: Secondary | ICD-10-CM | POA: Insufficient documentation

## 2019-07-07 DIAGNOSIS — Z8249 Family history of ischemic heart disease and other diseases of the circulatory system: Secondary | ICD-10-CM | POA: Insufficient documentation

## 2019-07-07 DIAGNOSIS — I509 Heart failure, unspecified: Secondary | ICD-10-CM | POA: Diagnosis not present

## 2019-07-07 DIAGNOSIS — Z86010 Personal history of colonic polyps: Secondary | ICD-10-CM | POA: Diagnosis not present

## 2019-07-07 DIAGNOSIS — Z91013 Allergy to seafood: Secondary | ICD-10-CM | POA: Insufficient documentation

## 2019-07-07 DIAGNOSIS — Z9842 Cataract extraction status, left eye: Secondary | ICD-10-CM | POA: Insufficient documentation

## 2019-07-07 DIAGNOSIS — N400 Enlarged prostate without lower urinary tract symptoms: Secondary | ICD-10-CM | POA: Diagnosis not present

## 2019-07-07 DIAGNOSIS — I110 Hypertensive heart disease with heart failure: Secondary | ICD-10-CM | POA: Diagnosis not present

## 2019-07-07 DIAGNOSIS — M545 Low back pain: Secondary | ICD-10-CM | POA: Insufficient documentation

## 2019-07-07 DIAGNOSIS — I480 Paroxysmal atrial fibrillation: Secondary | ICD-10-CM | POA: Diagnosis not present

## 2019-07-07 DIAGNOSIS — Z79899 Other long term (current) drug therapy: Secondary | ICD-10-CM | POA: Insufficient documentation

## 2019-07-07 DIAGNOSIS — I4891 Unspecified atrial fibrillation: Secondary | ICD-10-CM | POA: Diagnosis not present

## 2019-07-07 DIAGNOSIS — Z91030 Bee allergy status: Secondary | ICD-10-CM | POA: Diagnosis not present

## 2019-07-07 HISTORY — PX: ARTERY BIOPSY: SHX891

## 2019-07-07 SURGERY — BIOPSY TEMPORAL ARTERY
Anesthesia: General | Site: Face | Laterality: Right

## 2019-07-07 MED ORDER — LACTATED RINGERS IV SOLN: INTRAVENOUS | Status: DC

## 2019-07-07 MED ORDER — EPHEDRINE 5 MG/ML INJ
INTRAVENOUS | Status: AC
  Filled 2019-07-07: qty 10

## 2019-07-07 MED ORDER — LACTATED RINGERS IV SOLN
INTRAVENOUS | Status: DC | PRN
  Administered 2019-07-07: 12:00:00 via INTRAVENOUS

## 2019-07-07 MED ORDER — LIDOCAINE HCL (PF) 1 % IJ SOLN
INTRAMUSCULAR | Status: AC
  Filled 2019-07-07: qty 30

## 2019-07-07 MED ORDER — LIDOCAINE HCL (CARDIAC) PF 100 MG/5ML IV SOSY
PREFILLED_SYRINGE | INTRAVENOUS | Status: DC | PRN
  Administered 2019-07-07: 60 mg via INTRATRACHEAL

## 2019-07-07 MED ORDER — HYDROMORPHONE HCL 1 MG/ML IJ SOLN: 0.2500 mg | INTRAMUSCULAR | Status: DC | PRN

## 2019-07-07 MED ORDER — MIDAZOLAM HCL 2 MG/2ML IJ SOLN: 0.5000 mg | Freq: Once | INTRAMUSCULAR | Status: DC | PRN

## 2019-07-07 MED ORDER — CHLORHEXIDINE GLUCONATE CLOTH 2 % EX PADS: 6.0000 | MEDICATED_PAD | Freq: Once | CUTANEOUS | Status: DC

## 2019-07-07 MED ORDER — EPHEDRINE SULFATE 50 MG/ML IJ SOLN
INTRAMUSCULAR | Status: DC | PRN
  Administered 2019-07-07: 10 mg via INTRAVENOUS

## 2019-07-07 MED ORDER — 0.9 % SODIUM CHLORIDE (POUR BTL) OPTIME
TOPICAL | Status: DC | PRN
  Administered 2019-07-07: 13:00:00 1000 mL

## 2019-07-07 MED ORDER — DEXAMETHASONE SODIUM PHOSPHATE 4 MG/ML IJ SOLN
INTRAMUSCULAR | Status: DC | PRN
  Administered 2019-07-07: 4 mg via INTRAVENOUS

## 2019-07-07 MED ORDER — LIDOCAINE HCL (PF) 1 % IJ SOLN
INTRAMUSCULAR | Status: DC | PRN
  Administered 2019-07-07: 2 mL

## 2019-07-07 MED ORDER — BUPIVACAINE HCL (PF) 0.5 % IJ SOLN
INTRAMUSCULAR | Status: AC
  Filled 2019-07-07: qty 30

## 2019-07-07 MED ORDER — FENTANYL CITRATE (PF) 100 MCG/2ML IJ SOLN
INTRAMUSCULAR | Status: DC | PRN
  Administered 2019-07-07: 100 ug via INTRAVENOUS

## 2019-07-07 MED ORDER — ONDANSETRON HCL 4 MG/2ML IJ SOLN
INTRAMUSCULAR | Status: DC | PRN
  Administered 2019-07-07: 4 mg via INTRAVENOUS

## 2019-07-07 MED ORDER — PHENYLEPHRINE HCL (PRESSORS) 10 MG/ML IV SOLN
INTRAVENOUS | Status: DC | PRN
  Administered 2019-07-07 (×4): 100 ug via INTRAVENOUS

## 2019-07-07 MED ORDER — BACITRACIN ZINC 500 UNIT/GM EX OINT
TOPICAL_OINTMENT | CUTANEOUS | Status: AC
  Filled 2019-07-07: qty 0.9

## 2019-07-07 MED ORDER — PHENYLEPHRINE 40 MCG/ML (10ML) SYRINGE FOR IV PUSH (FOR BLOOD PRESSURE SUPPORT)
PREFILLED_SYRINGE | INTRAVENOUS | Status: AC
  Filled 2019-07-07: qty 10

## 2019-07-07 MED ORDER — HYDROCODONE-ACETAMINOPHEN 7.5-325 MG PO TABS: 1.0000 | ORAL_TABLET | Freq: Once | ORAL | Status: DC | PRN

## 2019-07-07 MED ORDER — FENTANYL CITRATE (PF) 250 MCG/5ML IJ SOLN
INTRAMUSCULAR | Status: AC
  Filled 2019-07-07: qty 5

## 2019-07-07 MED ORDER — ONDANSETRON HCL 4 MG/2ML IJ SOLN
INTRAMUSCULAR | Status: AC
  Filled 2019-07-07: qty 2

## 2019-07-07 MED ORDER — PROPOFOL 10 MG/ML IV BOLUS
INTRAVENOUS | Status: DC | PRN
  Administered 2019-07-07: 150 mg via INTRAVENOUS

## 2019-07-07 MED ORDER — PROMETHAZINE HCL 25 MG/ML IJ SOLN: 6.2500 mg | INTRAMUSCULAR | Status: DC | PRN

## 2019-07-07 SURGICAL SUPPLY — 36 items
ADH SKN CLS APL DERMABOND .7 (GAUZE/BANDAGES/DRESSINGS) ×1
BALL CTTN STRL GZE (GAUZE/BANDAGES/DRESSINGS) ×1
BLADE SURG 15 STRL LF DISP TIS (BLADE) ×2 IMPLANT
BLADE SURG 15 STRL SS (BLADE) ×4
CLOTH BEACON ORANGE TIMEOUT ST (SAFETY) ×2 IMPLANT
COTTON BALL STERILE (GAUZE/BANDAGES/DRESSINGS) ×2
COTTON BALL STERILE 2 PK (GAUZE/BANDAGES/DRESSINGS) ×1 IMPLANT
COVER LIGHT HANDLE STERIS (MISCELLANEOUS) ×4 IMPLANT
COVER WAND RF STERILE (DRAPES) ×1 IMPLANT
DECANTER SPIKE VIAL GLASS SM (MISCELLANEOUS) ×2 IMPLANT
DERMABOND ADVANCED (GAUZE/BANDAGES/DRESSINGS) ×1
DERMABOND ADVANCED .7 DNX12 (GAUZE/BANDAGES/DRESSINGS) ×1 IMPLANT
DRAPE EENT ADH APERT 31X51 STR (DRAPES) ×2 IMPLANT
ELECT NDL TIP 2.8 STRL (NEEDLE) ×1 IMPLANT
ELECT NEEDLE TIP 2.8 STRL (NEEDLE) ×2 IMPLANT
ELECT REM PT RETURN 9FT ADLT (ELECTROSURGICAL) ×2
ELECTRODE REM PT RTRN 9FT ADLT (ELECTROSURGICAL) ×1 IMPLANT
GLOVE BIO SURGEON STRL SZ7 (GLOVE) ×1 IMPLANT
GLOVE BIOGEL PI IND STRL 7.0 (GLOVE) ×1 IMPLANT
GLOVE BIOGEL PI IND STRL 7.5 (GLOVE) ×1 IMPLANT
GLOVE BIOGEL PI INDICATOR 7.0 (GLOVE) ×2
GLOVE BIOGEL PI INDICATOR 7.5 (GLOVE) ×1
GLOVE ECLIPSE 7.0 STRL STRAW (GLOVE) ×2 IMPLANT
GOWN STRL REUS W/TWL LRG LVL3 (GOWN DISPOSABLE) ×4 IMPLANT
KIT TURNOVER KIT A (KITS) ×1 IMPLANT
MANIFOLD NEPTUNE II (INSTRUMENTS) ×2 IMPLANT
NDL HYPO 25X5/8 SAFETYGLIDE (NEEDLE) ×1 IMPLANT
NEEDLE HYPO 25X5/8 SAFETYGLIDE (NEEDLE) ×2 IMPLANT
NS IRRIG 1000ML POUR BTL (IV SOLUTION) ×2 IMPLANT
PACK MINOR (CUSTOM PROCEDURE TRAY) ×1 IMPLANT
PAD ARMBOARD 7.5X6 YLW CONV (MISCELLANEOUS) ×2 IMPLANT
PAD TELFA 3X4 1S STER (GAUZE/BANDAGES/DRESSINGS) ×2 IMPLANT
SUT VIC AB 5-0 P-3 18X BRD (SUTURE) ×1 IMPLANT
SUT VIC AB 5-0 P3 18 (SUTURE) ×2
SUT VICRYL AB 3 0 TIES (SUTURE) ×2 IMPLANT
SYR CONTROL 10ML LL (SYRINGE) ×2 IMPLANT

## 2019-07-07 NOTE — Transfer of Care (Signed)
Immediate Anesthesia Transfer of Care Note  Patient: [name].  Procedure(s) Performed: BIOPSY TEMPORAL ARTERY (Right Face)  Patient Location: PACU  Anesthesia Type:General  Level of Consciousness: awake, alert  and oriented  Airway & Oxygen Therapy: Patient Spontanous Breathing  Post-op Assessment: Report given to RN, Post -op Vital signs reviewed and stable and Patient moving all extremities X 4  Post vital signs: Reviewed and stable  Last Vitals:  Vitals Value Taken Time  BP 131/80 07/07/19 1328  Temp    Pulse 74 07/07/19 1329  Resp 12 07/07/19 1329  SpO2 93 % 07/07/19 1329  Vitals shown include unvalidated device data.  Last Pain:  Vitals:   07/07/19 1039  TempSrc: Oral  PainSc: 0-No pain      Patients Stated Pain Goal: 5 ([id number])  Complications: No apparent anesthesia complications

## 2019-07-07 NOTE — Anesthesia Postprocedure Evaluation (Signed)
Anesthesia Post Note  Patient: [name].  Procedure(s) Performed: BIOPSY TEMPORAL ARTERY (Right Face)  Patient location during evaluation: PACU Anesthesia Type: General Level of consciousness: awake and alert, patient cooperative and oriented Pain management: pain level controlled Vital Signs Assessment: post-procedure vital signs reviewed and stable Respiratory status: spontaneous breathing, respiratory function stable and nonlabored ventilation Cardiovascular status: stable Postop Assessment: no apparent nausea or vomiting Anesthetic complications: no     Last Vitals:  Vitals:   07/07/19 1039  BP: 135/78  Resp: 20  Temp: 36.6 C  SpO2: 96%    Last Pain:  Vitals:   07/07/19 1039  TempSrc: Oral  PainSc: 0-No pain                 Charonda Hefter

## 2019-07-07 NOTE — Op Note (Signed)
Patient:  [name].  DOB:  10-30-1940  MRN:  [id number]   Preop Diagnosis: Right side headaches, rule out temporal arteritis  Postop Diagnosis: Same  Procedure: Right temporal artery biopsy  Surgeon: Aviva Signs, MD  Anes: General  Indications: Patient is a 78 year old white male who was referred to my care for right temporal artery biopsy.  The risks and benefits of the procedure including bleeding and infection were fully explained to the patient, who gave informed consent.  Procedure note: The patient was placed in the supine position.  After general anesthesia was administered, the right temporal region was prepped and draped using usual sterile technique with Betadine.  Surgical site confirmation was performed.  Minimal hairline shaving was needed.  The right temporal artery was localized using Doppler just next to the inner tragus.  A longitudinal incision was made and the dissection was taken down to the temporal artery.  The temporal artery was then dissected free and 3-0 Vicryl ties were placed proximally distally on the artery.  Confirmation that this was the artery was done using Doppler.  The artery was then excised and sent to pathology further examination.  No abnormal bleeding was noted at the end of the procedure.  The skin was closed using a 5-0 Vicryl subcuticular suture.  0.5% Sensorcaine was instilled into the surrounding wound.  Dermabond was applied.  All tape needle counts were correct at the end of the procedure.  The patient was awakened and transferred to PACU in stable condition.  Complications: None  EBL: Minimal  Specimen: Right temporal artery

## 2019-07-07 NOTE — Anesthesia Procedure Notes (Signed)
Procedure Name: LMA Insertion Date/Time: 07/07/2019 12:29 PM Performed by: Jonna Munro, CRNA Pre-anesthesia Checklist: Patient identified, Emergency Drugs available, Suction available, Patient being monitored and Timeout performed Patient Re-evaluated:Patient Re-evaluated prior to induction Oxygen Delivery Method: Circle system utilized Preoxygenation: Pre-oxygenation with 100% oxygen Induction Type: IV induction LMA: LMA inserted LMA Size: 5.0 Number of attempts: 1 Placement Confirmation: positive ETCO2 and breath sounds checked- equal and bilateral Dental Injury: Teeth and Oropharynx as per pre-operative assessment

## 2019-07-07 NOTE — Anesthesia Preprocedure Evaluation (Signed)
Anesthesia Evaluation  Patient identified by MRN, date of birth, ID band Patient awake    Reviewed: Allergy & Precautions, NPO status , Patient's Chart, lab work & pertinent test results, reviewed documented beta blocker date and time   Airway Mallampati: II  TM Distance: >3 FB Neck ROM: Full    Dental no notable dental hx. (+) Teeth Intact   Pulmonary shortness of breath and with exertion, sleep apnea and Continuous Positive Airway Pressure Ventilation , former smoker,    Pulmonary exam normal breath sounds clear to auscultation       Cardiovascular Exercise Tolerance: Good hypertension, Pt. on medications and Pt. on home beta blockers +CHF  Normal cardiovascular exam+ dysrhythmias Atrial Fibrillation + pacemaker I Rhythm:Regular Rate:Normal  Can walk 3 blocks  Denies recent CP Underlying rhythm Afib  Vent Paced ~70s   Neuro/Psych  Neuromuscular disease negative psych ROS   GI/Hepatic negative GI ROS, Neg liver ROS,   Endo/Other  negative endocrine ROS  Renal/GU Renal InsufficiencyRenal disease  negative genitourinary   Musculoskeletal negative musculoskeletal ROS (+)   Abdominal   Peds negative pediatric ROS (+)  Hematology negative hematology ROS (+)   Anesthesia Other Findings   Reproductive/Obstetrics negative OB ROS                             Anesthesia Physical Anesthesia Plan  ASA: IV  Anesthesia Plan: General   Post-op Pain Management:    Induction: Intravenous  PONV Risk Score and Plan: 2 and Ondansetron, Dexamethasone and Treatment may vary due to age or medical condition  Airway Management Planned: LMA and Oral ETT  Additional Equipment:   Intra-op Plan:   Post-operative Plan: Extubation in OR  Informed Consent: I have reviewed the patients History and Physical, chart, labs and discussed the procedure including the risks, benefits and alternatives for the  proposed anesthesia with the patient or authorized representative who has indicated his/her understanding and acceptance.     Dental advisory given  Plan Discussed with: CRNA  Anesthesia Plan Comments: (Plan Full PPE use Plan GA (LMA) vs.  GETA D/W PT -WTP with same after Q&A)        Anesthesia Quick Evaluation

## 2019-07-07 NOTE — Discharge Instructions (Signed)
May restart Eliquis tomorrow.    Temporal Artery Biopsy, Care After This sheet gives you information about how to care for yourself after your procedure. Your health care provider may also give you more specific instructions. If you have problems or questions, contact your health care provider. What can I expect after the procedure? After the procedure, it is common to have:  Soreness.  Bruising.  Numbness.  Swelling. Follow these instructions at home: Incision care   Follow instructions from your health care provider about how to take care of your incision. Make sure you: ? Wash your hands with soap and water before and after you change your bandage (dressing). If soap and water are not available, use hand sanitizer. ? Change your dressing as told by your health care provider. ? Leave stitches (sutures), skin glue, or adhesive strips in place. These skin closures may need to stay in place for 2 weeks or longer. If adhesive strip edges start to loosen and curl up, you may trim the loose edges. Do not remove adhesive strips completely unless your health care provider tells you to do that.  Check your incision area every day for signs of infection. Check for: ? Redness, swelling, or pain. ? Fluid or blood. ? Warmth. ? Pus or a bad smell. Activity  Do not do any physical work or strenuous exercise until your health care provider approves.  Do not lift anything that is heavier than 10 lb (4.5 kg), or the limit that you are told, until your health care provider says that it is safe. General instructions   Take over-the-counter and prescription medicines only as told by your health care provider. Do not take aspirin or other NSAIDs unless your health care provider tells you to take them. Aspirin may increase your risk of bleeding at the incision site.  Do not drive for 24 hours if you were given a sedative during your procedure.  Follow your health care provider's instructions  on bathing or showering.  Keep all follow-up visits as told by your health care provider. This is important. Contact a health care provider if:  Medicine does not help your pain.  You have chills.  You have redness, swelling, or pain around your incision site.  You have fluid or blood coming from your incision site.  Your incision feels warm to the touch.  You have pus or a bad smell coming from your incision site.  You have a fever.  You have nausea or vomiting. Get help right away if:  You have bleeding from the incision that does not stop after 30 minutes of applying heavy pressure.  You have chest pain.  You have shortness of breath.  You faint.  You have sudden vision loss.  You have weakness or drooping in your face or eye. Summary  Follow instructions from your health care provider about how to take care of your incision.  Do not do any physical work or strenuous exercise until your health care provider approves.  Take over-the-counter and prescription medicines only as told by your health care provider.  Contact a health care provider if you have signs of infection at your incision.  Keep all follow-up visits as told by your health care provider. This is important. This information is not intended to replace advice given to you by your health care provider. Make sure you discuss any questions you have with your health care provider. Document Released: 10/01/2015 Document Revised: 05/29/2018 Document Reviewed: 05/29/2018 Elsevier Patient  Education  [employer].

## 2019-07-07 NOTE — Interval H&P Note (Signed)
History and Physical Interval Note:  07/07/2019 10:45 AM  [name].  has presented today for surgery, with the diagnosis of temporal arteritis.  The various methods of treatment have been discussed with the patient and family. After consideration of risks, benefits and other options for treatment, the patient has consented to  Procedure(s): BIOPSY TEMPORAL ARTERY (Right) as a surgical intervention.  The patient's history has been reviewed, patient examined, no change in status, stable for surgery.  I have reviewed the patient's chart and labs.  Questions were answered to the patient's satisfaction.     Aviva Signs

## 2019-07-08 ENCOUNTER — Encounter (HOSPITAL_COMMUNITY): Payer: Self-pay | Admitting: General Surgery

## 2019-07-08 DIAGNOSIS — M353 Polymyalgia rheumatica: Secondary | ICD-10-CM | POA: Diagnosis not present

## 2019-07-08 LAB — SURGICAL PATHOLOGY

## 2019-07-10 ENCOUNTER — Telehealth: Payer: Self-pay | Admitting: General Surgery

## 2019-07-10 ENCOUNTER — Telehealth (INDEPENDENT_AMBULATORY_CARE_PROVIDER_SITE_OTHER): Payer: Self-pay | Admitting: General Surgery

## 2019-07-10 ENCOUNTER — Other Ambulatory Visit: Payer: Self-pay

## 2019-07-10 DIAGNOSIS — Z09 Encounter for follow-up examination after completed treatment for conditions other than malignant neoplasm: Secondary | ICD-10-CM

## 2019-07-10 NOTE — Telephone Encounter (Signed)
Virtual visit performed.  Patient states he is doing very well.  His headaches have eased.  He saw his primary care physician yesterday who told him the biopsy results were negative.  I told him to call me should any issues arise.  He was pleased with results.

## 2019-07-16 DIAGNOSIS — M353 Polymyalgia rheumatica: Secondary | ICD-10-CM | POA: Diagnosis not present

## 2019-07-17 DIAGNOSIS — M353 Polymyalgia rheumatica: Secondary | ICD-10-CM | POA: Diagnosis not present

## 2019-08-01 DIAGNOSIS — I4891 Unspecified atrial fibrillation: Secondary | ICD-10-CM | POA: Diagnosis not present

## 2019-08-01 DIAGNOSIS — I10 Essential (primary) hypertension: Secondary | ICD-10-CM | POA: Diagnosis not present

## 2019-08-08 DIAGNOSIS — M5416 Radiculopathy, lumbar region: Secondary | ICD-10-CM | POA: Diagnosis not present

## 2019-08-08 DIAGNOSIS — M353 Polymyalgia rheumatica: Secondary | ICD-10-CM | POA: Diagnosis not present

## 2019-09-02 ENCOUNTER — Ambulatory Visit (INDEPENDENT_AMBULATORY_CARE_PROVIDER_SITE_OTHER): Payer: Medicare Other | Admitting: Internal Medicine

## 2019-09-02 ENCOUNTER — Other Ambulatory Visit: Payer: Self-pay

## 2019-09-02 VITALS — BP 111/66 | HR 50 | Temp 96.8°F | Ht 68.0 in | Wt 260.0 lb

## 2019-09-02 DIAGNOSIS — Z950 Presence of cardiac pacemaker: Secondary | ICD-10-CM

## 2019-09-02 DIAGNOSIS — I495 Sick sinus syndrome: Secondary | ICD-10-CM

## 2019-09-02 DIAGNOSIS — M353 Polymyalgia rheumatica: Secondary | ICD-10-CM | POA: Diagnosis not present

## 2019-09-02 NOTE — Progress Notes (Signed)
HPI [name] returns today for followup of diastolic heart failure, persistent atrial fib, HTN, obesity, CHB, s/p PPM insertion. He is no longer on an AA drug. His VR is well controlled. He denies chest pain or sob though he has some mild dyspnea with exertion. He has not had syncope. No edema. No palpitations.  Allergies  Allergen Reactions  . Bee Venom Anaphylaxis and Swelling  . Shellfish Allergy Hives    Accelerated allergic reaction; questionable laryngospasm or laryngeal edema with some difficulty breathing prompting ED evaluation and treatment at [facility]  . Penicillins Rash    Has patient had a PCN reaction causing immediate rash, facial/tongue/throat swelling, SOB or lightheadedness with hypotension: No Has patient had a PCN reaction causing severe rash involving mucus membranes or skin necrosis: No Has patient had a PCN reaction that required hospitalization No Has patient had a PCN reaction occurring within the last 10 years: No If all of the above answers are "NO", then may proceed with Cephalosporin use.      Current Outpatient Medications  Medication Sig Dispense Refill  . acetaminophen (TYLENOL) 500 MG tablet Take 1,000 mg by mouth 2 (two) times daily.    Marland Kitchen antiseptic oral rinse (BIOTENE) LIQD 15 mLs by Mouth Rinse route as needed for dry mouth.    Marland Kitchen apixaban (ELIQUIS) 5 MG TABS tablet Take 1 tablet (5 mg total) by mouth 2 (two) times daily. 60 tablet 1  . CALCIUM-MAGNESIUM-VITAMIN D PO Take 1 tablet by mouth at bedtime.     Marland Kitchen diltiazem (TIAZAC) 120 MG 24 hr capsule Take 120 mg by mouth every evening.     . diltiazem (TIAZAC) 300 MG 24 hr capsule Take 300 mg by mouth daily.     . furosemide (LASIX) 40 MG tablet Take 1 tablet (40 mg total) by mouth 2 (two) times daily. (Patient taking differently: Take 40-80 mg by mouth 2 (two) times daily. Take 2 tablets (80 mg) by mouth in the morning & take 1 tablet (40 mg) by mouth in the evening.) 60 tablet 0  . gabapentin  (NEURONTIN) 300 MG capsule Take 1,200 mg by mouth 2 (two) times daily.     . hydrocortisone (ANUSOL-HC) 2.5 % rectal cream Place 1 application rectally as needed for hemorrhoids (after each bowel movement).     Marland Kitchen ketoconazole (NIZORAL) 2 % cream Apply 1 application topically daily as needed (skin irritation).     Marland Kitchen lidocaine (LIDODERM) 5 % Place 1 patch onto the skin daily as needed (back pain.). Remove & Discard patch within 12 hours or as directed by MD    . lidocaine (XYLOCAINE) 5 % ointment Apply 1 application topically 3 (three) times daily as needed (back pain.).    Marland Kitchen metoprolol tartrate (LOPRESSOR) 25 MG tablet Take 0.5 tablets (12.5 mg total) by mouth 2 (two) times daily. 90 tablet 3  . pantoprazole (PROTONIX) 40 MG tablet Take 40 mg by mouth daily.    . predniSONE (DELTASONE) 20 MG tablet Take 25 mg by mouth daily with breakfast.    . Tamsulosin HCl (FLOMAX) 0.4 MG CAPS Take 0.4 mg by mouth daily.     . traMADol (ULTRAM) 50 MG tablet Take by mouth every 6 (six) hours as needed.    . zinc oxide (BALMEX) 11.3 % CREA cream Apply 1 application topically 2 (two) times daily as needed (skin breakdown).      No current facility-administered medications for this visit.     Past Medical  History:  Diagnosis Date  . Atrial flutter ([place])    a. s/p ablation 01/2015.  Marland Kitchen BPH (benign prostatic hyperplasia)   . Cerebrovascular disease    carotid bruit; no focal stenosis by carotid ultrasound in 05/2008  . Congestive heart failure (CHF) ([place])   . Dysrhythmia   . General weakness    +malaise, exercise intolerance, and near syncope  . Hypertension   . Low back pain    history of traumax2  . Paroxysmal atrial fibrillation ([facility])    on Eliquis  . Presence of permanent cardiac pacemaker 2009  . Sleep apnea    c pap at night  . Tachy-brady syndrome ([place])    a. s/p St. Jude pacemaker 2009.    ROS:   All systems reviewed and negative except as noted in the HPI.   Past Surgical History:    Procedure Laterality Date  . ABLATION    . ARTERY BIOPSY Right 07/07/2019   Procedure: BIOPSY TEMPORAL ARTERY;  Surgeon: Aviva Signs, MD;  Location: [facility];  Service: General;  Laterality: Right;  . CATARACT EXTRACTION     Bilateral  . CERVICAL DISCECTOMY  2006  . COLONOSCOPY WITH PROPOFOL N/A 01/26/2017   Procedure: COLONOSCOPY WITH PROPOFOL;  Surgeon: [name], MD;  Location: [facility];  Service: Endoscopy;  Laterality: N/A;  8:30  . ELECTROPHYSIOLOGIC STUDY N/A 01/18/2015   Procedure: A-Flutter/A-Tach/Svt Ablation;  Surgeon: [name], MD;  Location: [place] [facility];  Service: Cardiovascular;  Laterality: N/A;  . INSERT / REPLACE / REMOVE PACEMAKER    . KNEE ARTHROSCOPY WITH MEDIAL MENISECTOMY Left 06/03/2015   Procedure: KNEE ARTHROSCOPY WITH MEDIAL MENISECTOMY;  Surgeon: [name], MD;  Location: [facility];  Service: Orthopedics;  Laterality: Left;  . PACEMAKER INSERTION  1966  . POLYPECTOMY  01/26/2017   Procedure: POLYPECTOMY;  Surgeon: [name], MD;  Location: [facility];  Service: Endoscopy;;  colon  . PPM GENERATOR CHANGEOUT N/A 11/11/2018   Procedure: PPM GENERATOR CHANGEOUT;  Surgeon: [name], MD;  Location: [place] [facility];  Service: Cardiovascular;  Laterality: N/A;     Family History  Problem Relation Age of Onset  . Hypertension Other   . Neuropathy Neg Hx   . Stroke Neg Hx      Social History   Socioeconomic History  . Marital status: Divorced    Spouse name: Not on file  . Number of children: 3  . Years of education: 12+  . Highest education level: Not on file  Occupational History  . Occupation: Retired  Tobacco Use  . Smoking status: Former Smoker    Packs/day: 1.00    Years: 0.50    Pack years: 0.50    Types: Cigarettes    Quit date: 03/27/1962    Years since quitting: 57.4  . Smokeless tobacco: Never Used  Substance and Sexual Activity  . Alcohol use: No    Alcohol/week: 0.0 standard drinks  . Drug  use: No  . Sexual activity: Not Currently  Other Topics Concern  . Not on file  Social History Narrative   Divorced with 3 adult children. Semi retired.    Caffeine use: once daily   Social Determinants of Health   Financial Resource Strain:   . Difficulty of Paying Living Expenses: Not on file  Food Insecurity:   . Worried About Charity fundraiser in the Last Year: Not on file  . Ran Out of Food in the  Last Year: Not on file  Transportation Needs:   . Lack of Transportation (Medical): Not on file  . Lack of Transportation (Non-Medical): Not on file  Physical Activity:   . Days of Exercise per Week: Not on file  . Minutes of Exercise per Session: Not on file  Stress:   . Feeling of Stress : Not on file  Social Connections:   . Frequency of Communication with Friends and Family: Not on file  . Frequency of Social Gatherings with Friends and Family: Not on file  . Attends Religious Services: Not on file  . Active Member of Clubs or Organizations: Not on file  . Attends Archivist Meetings: Not on file  . Marital Status: Not on file  Intimate Partner Violence:   . Fear of Current or Ex-Partner: Not on file  . Emotionally Abused: Not on file  . Physically Abused: Not on file  . Sexually Abused: Not on file     BP 111/66   Pulse (!) 50   Temp (!) 96.8 F (36 C)   Ht 5\' 8"  (1.727 m)   Wt 260 lb (117.9 kg)   SpO2 90%   BMI 39.53 kg/m   Physical Exam:  Well appearing NAD HEENT: Unremarkable Neck:  No JVD, no thyromegally Lymphatics:  No adenopathy Back:  No CVA tenderness Lungs:  Clear with no wheezes HEART:  Regular rate rhythm, no murmurs, no rubs, no clicks Abd:  soft, positive bowel sounds, no organomegally, no rebound, no guarding Ext:  2 plus pulses, no edema, no cyanosis, no clubbing Skin:  No rashes no nodules Neuro:  CN II through XII intact, motor grossly intact   DEVICE  Normal device function.  See PaceArt for details. Atrial fib  persists.  Assess/Plan: 1. Persistent atrial fib - he is minimally if at all symptomatic. He wll continue his current meds.  2. Obesity - he has continued to gain weight and admits to dietary indiscretion. I asked him to lose 20 lbs. 3. HTN - his SBP is well controlled. No change 4. PPM - his St. Jude DDD PM demonstrates persistent atrial fib with backup pacing present. We will follow.  Mikle Bosworth.D.

## 2019-09-02 NOTE — Patient Instructions (Signed)
Medication Instructions:  Your physician recommends that you continue on your current medications as directed. Please refer to the Current Medication list given to you today.  *If you need a refill on your cardiac medications before your next appointment, please call your pharmacy*  Lab Work: NONE  If you have labs (blood work) drawn today and your tests are completely normal, you will receive your results only by: Marland Kitchen MyChart Message (if you have MyChart) OR . A paper copy in the mail If you have any lab test that is abnormal or we need to change your treatment, we will call you to review the results.  Testing/Procedures: .None   Follow-Up: At [facility], you and your health needs are our priority.  As part of our continuing mission to provide you with exceptional heart care, we have created designated Provider Care Teams.  These Care Teams include your primary Cardiologist (physician) and Advanced Practice Providers (APPs -  Physician Assistants and Nurse Practitioners) who all work together to provide you with the care you need, when you need it.  Your next appointment:   1 year(s)  The format for your next appointment:   In Person  Provider:   [name], MD  Other Instructions Thank you for choosing [facility]!

## 2019-09-10 DIAGNOSIS — M353 Polymyalgia rheumatica: Secondary | ICD-10-CM | POA: Diagnosis not present

## 2019-09-10 DIAGNOSIS — I482 Chronic atrial fibrillation, unspecified: Secondary | ICD-10-CM | POA: Diagnosis not present

## 2019-09-16 ENCOUNTER — Ambulatory Visit (INDEPENDENT_AMBULATORY_CARE_PROVIDER_SITE_OTHER): Payer: Medicare Other | Admitting: *Deleted

## 2019-09-16 DIAGNOSIS — I480 Paroxysmal atrial fibrillation: Secondary | ICD-10-CM | POA: Diagnosis not present

## 2019-09-16 LAB — CUP PACEART REMOTE DEVICE CHECK
Battery Remaining Longevity: 109 mo
Battery Remaining Percentage: 95.5 %
Battery Voltage: 2.99 V
Brady Statistic AP VP Percent: 0 %
Brady Statistic AP VS Percent: 0 %
Brady Statistic AS VP Percent: 0 %
Brady Statistic AS VS Percent: 0 %
Brady Statistic RA Percent Paced: 1 %
Brady Statistic RV Percent Paced: 88 %
Date Time Interrogation Session: 20201229040014
Implantable Lead Implant Date: 20090903
Implantable Lead Implant Date: 20090903
Implantable Lead Location: 753859
Implantable Lead Location: 753860
Implantable Pulse Generator Implant Date: 20200224
Lead Channel Impedance Value: 410 Ohm
Lead Channel Impedance Value: 540 Ohm
Lead Channel Pacing Threshold Amplitude: 0.625 V
Lead Channel Pacing Threshold Amplitude: 0.875 V
Lead Channel Pacing Threshold Pulse Width: 0.5 ms
Lead Channel Pacing Threshold Pulse Width: 0.5 ms
Lead Channel Sensing Intrinsic Amplitude: 1.7 mV
Lead Channel Sensing Intrinsic Amplitude: 12 mV
Lead Channel Setting Pacing Amplitude: 0.875
Lead Channel Setting Pacing Amplitude: 1.875
Lead Channel Setting Pacing Pulse Width: 0.5 ms
Lead Channel Setting Sensing Sensitivity: 4 mV
Pulse Gen Model: 2272
Pulse Gen Serial Number: 9107097

## 2019-10-08 ENCOUNTER — Encounter: Payer: Self-pay | Admitting: Neurology

## 2019-10-08 ENCOUNTER — Ambulatory Visit: Payer: Medicare Other | Admitting: Neurology

## 2019-10-08 ENCOUNTER — Other Ambulatory Visit: Payer: Self-pay

## 2019-10-08 VITALS — BP 122/70 | HR 80 | Temp 97.3°F | Ht 68.0 in | Wt 258.0 lb

## 2019-10-08 DIAGNOSIS — Z79899 Other long term (current) drug therapy: Secondary | ICD-10-CM | POA: Diagnosis not present

## 2019-10-08 DIAGNOSIS — Z131 Encounter for screening for diabetes mellitus: Secondary | ICD-10-CM | POA: Diagnosis not present

## 2019-10-08 DIAGNOSIS — E538 Deficiency of other specified B group vitamins: Secondary | ICD-10-CM | POA: Diagnosis not present

## 2019-10-08 DIAGNOSIS — R200 Anesthesia of skin: Secondary | ICD-10-CM

## 2019-10-08 NOTE — Patient Instructions (Signed)

## 2019-10-08 NOTE — Progress Notes (Signed)
[facility]    Provider:  Dr Jaynee Eagles Referring Provider: [name], MD Primary Care Physician:  [name], MD  CC:  Low back pain and numb feet  HPI 10/08/2019: This is a very nice 79 year old patient who is here at the request of Dr. [name] for low back pain and numbness in the feet. PMHx sleep apnea, permanent cardiac pacemaker, paroxysmal atrial fibrillation on long-term anticoagulation currently Eliquis, chronic low back pain for decades, hypertension, congestive heart failure, cerebrovascular disease, prediabetes, CKD.  I reviewed Dr. Ria Comment notes but stated patient having worsening low back pain and radicular symptoms as well as new numbness in the bottom of the feet, Dr. Willey Blade sent patient back for further evaluation to see if additional or repeat imaging may be warranted and also see if there were any additional causes for the numbness in the bottom of his feet other than his L5-S1 impingement and prediabetes.  I saw this patient over 3 years ago at which time we performed a CT of the lumbar spine (he cannot have MRIs) and patient declined any further work-up, it did show multilevel degenerative changes and up to 10 mm anterior listhesis. Unfortunately he has degenerative disease and l5/s1 anterolisthesis of 30m, I reviewed the images of the CT for him as he is reporting more low back pain with radiation into the legs however he does tell me is well controlled with medication.  We discussed the options which include steroid injections which will be difficult since he is on a blood thinner for A. fib, or surgery which would be difficult for many reasons including the blood thinner.  He is not interested in surgery at this time and also due to eliquis very difficult for interventions and he is not interested in going off his Eliquis for any pain interventions.  He also reports that has numbness on the bottom of his feet the entire bottom of his feet and toes. It has been numb  for 3 months. The last few days a little better, shooting pains into the big toes and numbness on the bottom the feet from the heels to the toes, no pain, doesn't hurt when walking, no tingling or burning, He has worsening low back pain as above but no weakness and again he does say that his pain is well controlled on medications and he is not interested in anything further.  Personally reviewed images CT lumbar spine 02/2016: T12-L1:  Unremarkable.  L1-2:  No impingement.  Mild disc bulge.  L2-3:  No impingement.  Mild disc bulge.  L3-4:  No impingement.  Mild disc bulge.  L4-5:  No impingement.  Mild disc bulge.  L5-S1: Prominent left and moderate to prominent right foraminal stenosis due to disc uncovering and facet and intervertebral spurring. Not appreciably changed from prior.  Stable prominent impingement at L5-S1 due to disc uncovering and facet and intervertebral spurring. Bilateral pars defects at L5 cause 10 mm of anterolisthesis.  HPI:  EJihan Rudy is a 79y.o. male here as a referral from Dr. FWilley Bladefor low back pain and numb legs. LBP since 1964. PMHx of HTN, OSA on CPAP, BPH, paroxysmal atrial fib, paroxysmal atrial flutter, tachy-brady s/p St. Jude pacemaker 2009. Patient reports a multitude of symptoms including severe low back pain, numbness in the entirety of both his legs when he bends over even just a little bit, numbness and tremors in the arms, weakness in the arms and legs, dizziness, paresthesias and pain. Low  back pain has been going on since 1964. [facility] He saw Dr. Niel Hummer and he was seen several times and had shots in the spine. His has decreased ROM can't bend over. He gets a funny feeling in the back and legs go numb all the way down and feels weakness in his legs. Last week he was on a riding mower and legs got numb when he got off after he got a leaf blower. He had to clean out the counter and he was down on his  knees and he was raking things out of the counter and right arm got very weak and collapsed and any kind of strain on the arms will make then go weak and shake. Positional. The symptoms are episodic. He was started on Flecanide. Symptoms in the hands started with Flecainide. Driving up he was feeling some tingling in the fingers. No falls.  Numbness in the legs started several years ago and worsening . Headache. Head feels numb. Can't have an MRi brain.   Reviewed notes, labs and imaging from outside physicians, which showed:  CBC March 2017 is normal, BMP in March 2017 showed creatinine 1.37 which was elevated from previous baseline.  Patient wrote a list today includes the following symptoms: Severe lower back pain, centered to the right is spine, neck pain, severe shaking of hands, arms and legs, extreme weakness in arms and legs, dizziness, sleeping leg, not eating well until afternoon, leaning forward few degrees in legs get numb, tingling feelings, pain and tingling when putting on shoes especially right foot  MRI of the lumbar spine April 2008: The conus medullaris extends to the T12-L1 disc space level and appears normal. There are no paraspinal abnormalities.  There are no disc space findings from T11-12 through L2-3.  L3-4: Minimal disc bulge. No spinal stenosis or nerve root encroachment.  L4-5: Stable mild disc bulge and bilateral facet hypertrophy. No spinal stenosis or nerve root encroachment. L5-S1: Pseudo disc bulge related to the pars defects and resulting anterolisthesis are again noted. The central canal and lateral recesses remain adequately patent. However, there is persistent biforaminal stenosis, left greater than right. Bilateral L5 nerve root encroachment is likely. IMPRESSION: 1. Overall, little change is seen from the prior examination of 03/24/05. 2. Bilateral L5 pars defects and resulting 9 mm of anterolisthesis at L5-S1 result in significant biforaminal  stenosis and probable bilateral L5 nerve root encroachment.  3. No other significant disc space findings. Mild disc bulging at L3-4 and L4-5 appears stable.   Review of Systems: Patient complains of symptoms per HPI as well as the following symptoms: Fatigue, easy bleeding. Pertinent negatives per HPI. All others negative.   Social History   Socioeconomic History   Marital status: Divorced    Spouse name: Not on file   Number of children: 3   Years of education: 12+   Highest education level: Not on file  Occupational History   Occupation: Retired  Tobacco Use   Smoking status: Former Smoker    Packs/day: 1.00    Years: 0.50    Pack years: 0.50    Types: Cigarettes    Quit date: 03/27/1962    Years since quitting: 57.5   Smokeless tobacco: Never Used  Substance and Sexual Activity   Alcohol use: No    Alcohol/week: 0.0 standard drinks   Drug use: No   Sexual activity: Not Currently  Other Topics Concern   Not on file  Social History Narrative   Divorced with 3 adult children. Semi retired.    Caffeine use: seldom    Lives alone   Social Determinants of Health   Financial Resource Strain:    Difficulty of Paying Living Expenses: Not on file  Food Insecurity:    Worried About Charity fundraiser in the Last Year: Not on file   [employer] of Food in the Last Year: Not on file  Transportation Needs:    Lack of Transportation (Medical): Not on file   Lack of Transportation (Non-Medical): Not on file  Physical Activity:    Days of Exercise per Week: Not on file   Minutes of Exercise per Session: Not on file  Stress:    Feeling of Stress : Not on file  Social Connections:    Frequency of Communication with Friends and Family: Not on file   Frequency of Social Gatherings with Friends and Family: Not on file   Attends Religious Services: Not on file   Active Member of Clubs or Organizations: Not on file   Attends Archivist Meetings:  Not on file   Marital Status: Not on file  Intimate Partner Violence:    Fear of Current or Ex-Partner: Not on file   Emotionally Abused: Not on file   Physically Abused: Not on file   Sexually Abused: Not on file    Family History  Problem Relation Age of Onset   Hypertension Other    Neuropathy Neg Hx    Stroke Neg Hx     Past Medical History:  Diagnosis Date   Atrial flutter ([place])    a. s/p ablation 01/2015.   BPH (benign prostatic hyperplasia)    Cerebrovascular disease    carotid bruit; no focal stenosis by carotid ultrasound in 05/2008   Congestive heart failure (CHF) (H[facility])    Dysrhythmia    General weakness    +malaise, exercise intolerance, and near syncope   Hypertension    Low back pain    history of traumax2   Paroxysmal atrial fibrillation ([place])    on Eliquis   Presence of permanent cardiac pacemaker 2009   Sleep apnea    c pap at night   Tachy-brady syndrome [facility])    a. s/p St. Jude pacemaker 2009.    Past Surgical History:  Procedure Laterality Date   ABLATION     ARTERY BIOPSY Right 07/07/2019   Procedure: BIOPSY TEMPORAL ARTERY;  Surgeon: Aviva Signs, MD;  Location: [facility];  Service: General;  Laterality: Right;   CATARACT EXTRACTION     Bilateral   CERVICAL DISCECTOMY  2006   COLONOSCOPY WITH PROPOFOL N/A 01/26/2017   Procedure: COLONOSCOPY WITH PROPOFOL;  Surgeon: [name], MD;  Location: [facility];  Service: Endoscopy;  Laterality: N/A;  8:30   ELECTROPHYSIOLOGIC STUDY N/A 01/18/2015   Procedure: A-Flutter/A-Tach/Svt Ablation;  Surgeon: [name], MD;  Location: [place] [facility];  Service: Cardiovascular;  Laterality: N/A;   INSERT / REPLACE / REMOVE PACEMAKER     KNEE ARTHROSCOPY WITH MEDIAL MENISECTOMY Left 06/03/2015   Procedure: KNEE ARTHROSCOPY WITH MEDIAL MENISECTOMY;  Surgeon: [name], MD;  Location: [facility];  Service: Orthopedics;  Laterality: Left;   PACEMAKER INSERTION  1966    POLYPECTOMY  01/26/2017   Procedure: POLYPECTOMY;  Surgeon: [name], MD;  Location: [facility];  Service: Endoscopy;;  colon   PPM GENERATOR CHANGEOUT N/A 11/11/2018   Procedure: PPM GENERATOR CHANGEOUT;  Surgeon: [name], MD;  Location: [facility];  Service: Cardiovascular;  Laterality: N/A;    Current Outpatient Medications  Medication Sig Dispense Refill   acetaminophen (TYLENOL) 500 MG tablet Take 1,000 mg by mouth 2 (two) times daily.     antiseptic oral rinse (BIOTENE) LIQD 15 mLs by Mouth Rinse route as needed for dry mouth.     apixaban (ELIQUIS) 5 MG TABS tablet Take 1 tablet (5 mg total) by mouth 2 (two) times daily. 60 tablet 1   CALCIUM-MAGNESIUM-VITAMIN D PO Take 1 tablet by mouth at bedtime.      diltiazem (TIAZAC) 120 MG 24 hr capsule Take 120 mg by mouth every evening.      diltiazem (TIAZAC) 300 MG 24 hr capsule Take 300 mg by mouth daily.      furosemide (LASIX) 40 MG tablet Take 1 tablet (40 mg total) by mouth 2 (two) times daily. (Patient taking differently: Take 40-80 mg by mouth 2 (two) times daily. Take 2 tablets (80 mg) by mouth in the morning & take 1 tablet (40 mg) by mouth in the evening.) 60 tablet 0   gabapentin (NEURONTIN) 300 MG capsule Take 1,200 mg by mouth 2 (two) times daily.      hydrocortisone (ANUSOL-HC) 2.5 % rectal cream Place 1 application rectally as needed for hemorrhoids (after each bowel movement).      ketoconazole (NIZORAL) 2 % cream Apply 1 application topically daily as needed (skin irritation).      lidocaine (LIDODERM) 5 % Place 1 patch onto the skin daily as needed (back pain.). Remove & Discard patch within 12 hours or as directed by MD     lidocaine (XYLOCAINE) 5 % ointment Apply 1 application topically 3 (three) times daily as needed (back pain.).     metoprolol tartrate (LOPRESSOR) 25 MG tablet Take 0.5 tablets (12.5 mg total) by mouth 2 (two) times daily. 90 tablet 3   pantoprazole (PROTONIX) 40 MG  tablet Take 40 mg by mouth daily.     predniSONE (DELTASONE) 20 MG tablet Take 25 mg by mouth daily with breakfast.     Tamsulosin HCl (FLOMAX) 0.4 MG CAPS Take 0.4 mg by mouth daily.      traMADol (ULTRAM) 50 MG tablet Take by mouth every 6 (six) hours as needed.     zinc oxide (BALMEX) 11.3 % CREA cream Apply 1 application topically 2 (two) times daily as needed (skin breakdown).      No current facility-administered medications for this visit.    Allergies as of 10/08/2019 - Review Complete 10/08/2019  Allergen Reaction Noted   Bee venom Anaphylaxis and Swelling 01/18/2015   Shellfish allergy Hives 04/15/2012   Penicillins Rash     Vitals: BP 122/70 (BP Location: Right Arm, Patient Position: Sitting, Cuff Size: Large)    Pulse 80    Temp (!) 97.3 F (36.3 C) Comment: taken at front   Ht 5' 8"  (1.727 m)    Wt 258 lb (117 kg)    BMI 39.23 kg/m  Last Weight:  Wt Readings from Last 1 Encounters:  10/08/19 258 lb (117 kg)   Last Height:   Ht Readings from Last 1 Encounters:  10/08/19 5' 8"  (1.727 m)    Physical exam: Exam: Gen: NAD, conversant, well nourised, obese, well groomed                     CV: RRR, no MRG. No Carotid Bruits. No peripheral edema,  warm, nontender Eyes: Conjunctivae clear without exudates or hemorrhage  Neuro: Detailed Neurologic Exam  Speech:    Speech is normal; fluent and spontaneous with normal comprehension.  Cognition:    The patient is oriented to person, place, and time;     recent and remote memory intact;     language fluent;     normal attention, concentration,     fund of knowledge Cranial Nerves:    The pupils are equal, round, and reactive to light. Attempted funduscopic exam could not visualize due to small pupils. Visual fields are full to finger confrontation. Extraocular movements are intact. Trigeminal sensation is intact and the muscles of mastication are normal. The face is symmetric. The palate elevates in the midline.  Hearing intact. Voice is normal. Shoulder shrug is normal. The tongue has normal motion without fasciculations.   Coordination:    No dysmetria   Gait:    Wide-based, no ataxia.  Motor Observation: Mild positional high frequrncy tremor. No asymmetry, no atrophy.  Tone:    Normal muscle tone.    Posture:    Posture is normal. normal erect. Romberg negative.     Strength: 5/5 throughout     Sensation: intact to LT, pin prick, vibration, temperature and proprioception distally in the upper and lower extremities, no changes.     Reflex Exam:  DTR's:    Absent AJs, trace patellars    Toes:    The toes are downgoing bilaterally.   Clonus:    Clonus is absent.      Assessment/Plan:  79 y.o. male here as a referral from Dr. Willey Blade for low back pain and numb feet. LBP since 1964. PMHx of HTN, OSA on CPAP, BPH, paroxysmal atrial fib, paroxysmal atrial flutter, tachy-brady s/p St. Jude pacemaker 2009.  Neurologic clinical deficits are not significant: Patient's sensory exam is still intact(he was seen 02/2016) without decrease in any of the sensory modalities in his distal lower extremities; his strength is very good 5 out of 5.  In fact exam is very stable as compared to my examination in 2017.  - He has chronic low back pain with radiculopathy, ongoing for decades per patient, CT of the lumbar spine (he cannot get an MRI) in 2017 showed stable prominent impingement at L5-S1 due to disc uncovering and facet and intervertebral spurring. Bilateral pars defects at L5 cause 10 mm of anterolisthesis.  Unfortunately patient is a poor surgical candidate and he is also not interested in surgery or epidural steroid injections/pain procedures.  He states he is well controlled on his pain medication so at this time I do not see the need to repeat a CT lumbar spine.  -He has numbness on the bottom of his feet from the toes to the heels.  However sensory exam was quite good.  This is likely from his L5-S1  impingement with possibly a contribution from his longstanding prediabetes which can also cause numbness in the feet.  I will check several other labs but at this point again medical management is indicated but luckily he has no pain.  - follow up as needed  Orders Placed This Encounter  Procedures   B12 and Folate Panel   Methylmalonic acid, serum   Vitamin B1   TSH   Heavy metals, blood   Vitamin B6   Multiple Myeloma Panel (SPEP&IFE w/QIG)   Hemoglobin A1c     Sarina Ill, MD  [facility] [address] [place], [place] [address]  Phone [phone] Fax [phone]

## 2019-10-09 DIAGNOSIS — M353 Polymyalgia rheumatica: Secondary | ICD-10-CM | POA: Diagnosis not present

## 2019-10-09 DIAGNOSIS — M5416 Radiculopathy, lumbar region: Secondary | ICD-10-CM | POA: Diagnosis not present

## 2019-10-13 ENCOUNTER — Other Ambulatory Visit (HOSPITAL_COMMUNITY): Payer: Self-pay | Admitting: Internal Medicine

## 2019-10-13 ENCOUNTER — Encounter: Payer: Self-pay | Admitting: Neurology

## 2019-10-13 DIAGNOSIS — R0782 Intercostal pain: Secondary | ICD-10-CM

## 2019-10-13 DIAGNOSIS — R0789 Other chest pain: Secondary | ICD-10-CM

## 2019-10-13 NOTE — Progress Notes (Signed)
[name], his labs are largely unremarkable except for pre-diabetes. He is having numbness on the bottom of his feet and I suspect this is due to his low back issues as well as prediabetes. Unfortunately even prediabetes over long period can cause numbness in the feet or neuropathy. No further workup needed he can return to his pcp and I will let his pcp know thanks

## 2019-10-14 ENCOUNTER — Ambulatory Visit (HOSPITAL_COMMUNITY)
Admission: RE | Admit: 2019-10-14 | Discharge: 2019-10-14 | Disposition: A | Discharge status: Home / Self Care | Payer: Medicare Other | Source: Ambulatory Visit | Attending: Internal Medicine | Admitting: Internal Medicine

## 2019-10-14 ENCOUNTER — Other Ambulatory Visit: Payer: Self-pay

## 2019-10-14 DIAGNOSIS — R0782 Intercostal pain: Secondary | ICD-10-CM | POA: Diagnosis not present

## 2019-10-14 DIAGNOSIS — R0789 Other chest pain: Secondary | ICD-10-CM | POA: Diagnosis not present

## 2019-10-14 DIAGNOSIS — R079 Chest pain, unspecified: Secondary | ICD-10-CM | POA: Diagnosis not present

## 2019-10-15 LAB — VITAMIN B1: Thiamine: 179.2 nmol/L (ref 66.5–200.0)

## 2019-10-15 LAB — MULTIPLE MYELOMA PANEL, SERUM
Albumin SerPl Elph-Mcnc: 3.9 g/dL (ref 2.9–4.4)
Albumin/Glob SerPl: 1.2 (ref 0.7–1.7)
Alpha 1: 0.3 g/dL (ref 0.0–0.4)
Alpha2 Glob SerPl Elph-Mcnc: 1.1 g/dL — ABNORMAL HIGH (ref 0.4–1.0)
B-Globulin SerPl Elph-Mcnc: 1.3 g/dL (ref 0.7–1.3)
Gamma Glob SerPl Elph-Mcnc: 0.8 g/dL (ref 0.4–1.8)
Globulin, Total: 3.5 g/dL (ref 2.2–3.9)
IgA/Immunoglobulin A, Serum: 308 mg/dL (ref 61–437)
IgG (Immunoglobin G), Serum: 871 mg/dL (ref 603–1613)
IgM (Immunoglobulin M), Srm: 58 mg/dL (ref 15–143)
Total Protein: 7.4 g/dL (ref 6.0–8.5)

## 2019-10-15 LAB — HEAVY METALS, BLOOD
Arsenic: 6 ug/L (ref 2–23)
Lead, Blood: 1 ug/dL (ref 0–4)
Mercury: 1.1 ug/L (ref 0.0–14.9)

## 2019-10-15 LAB — HEMOGLOBIN A1C
Est. average glucose Bld gHb Est-mCnc: 126 mg/dL
Hgb A1c MFr Bld: 6 % — ABNORMAL HIGH (ref 4.8–5.6)

## 2019-10-15 LAB — TSH: TSH: 1.27 u[IU]/mL (ref 0.450–4.500)

## 2019-10-15 LAB — B12 AND FOLATE PANEL
Folate: 18.9 ng/mL (ref >3.0)
Vitamin B-12: 511 pg/mL (ref 232–1245)

## 2019-10-15 LAB — METHYLMALONIC ACID, SERUM: Methylmalonic Acid: 551 nmol/L — ABNORMAL HIGH (ref 0–378)

## 2019-10-15 LAB — VITAMIN B6: Vitamin B6: 18.1 ug/L (ref 5.3–46.7)

## 2019-10-15 NOTE — Progress Notes (Signed)
B12 normal, MMA likely elevated due to CKD not likely clinically relevant

## 2019-10-24 DIAGNOSIS — H43811 Vitreous degeneration, right eye: Secondary | ICD-10-CM | POA: Diagnosis not present

## 2019-11-05 DIAGNOSIS — K219 Gastro-esophageal reflux disease without esophagitis: Secondary | ICD-10-CM | POA: Diagnosis not present

## 2019-11-05 DIAGNOSIS — I10 Essential (primary) hypertension: Secondary | ICD-10-CM | POA: Diagnosis not present

## 2019-11-05 DIAGNOSIS — I4891 Unspecified atrial fibrillation: Secondary | ICD-10-CM | POA: Diagnosis not present

## 2019-11-05 DIAGNOSIS — Z79899 Other long term (current) drug therapy: Secondary | ICD-10-CM | POA: Diagnosis not present

## 2019-11-05 DIAGNOSIS — M353 Polymyalgia rheumatica: Secondary | ICD-10-CM | POA: Diagnosis not present

## 2019-11-13 DIAGNOSIS — M353 Polymyalgia rheumatica: Secondary | ICD-10-CM | POA: Diagnosis not present

## 2019-11-13 DIAGNOSIS — R079 Chest pain, unspecified: Secondary | ICD-10-CM | POA: Diagnosis not present

## 2019-12-09 DIAGNOSIS — R519 Headache, unspecified: Secondary | ICD-10-CM | POA: Diagnosis not present

## 2019-12-09 DIAGNOSIS — M2550 Pain in unspecified joint: Secondary | ICD-10-CM | POA: Diagnosis not present

## 2019-12-09 DIAGNOSIS — M545 Low back pain: Secondary | ICD-10-CM | POA: Diagnosis not present

## 2019-12-15 ENCOUNTER — Telehealth: Payer: Self-pay | Admitting: *Deleted

## 2019-12-15 NOTE — Telephone Encounter (Signed)
Noted lab results from last appt 09/2019. Not apparent [name] had been called. I spoke with the [name] today and and discussed the lab results. [name] was very appreciative and verbalized understanding. His questions were answered. He will f/u with PCP regarding the prediabetes. Requested labs sent to PCP. I faxed them via epic to Dr. [name].   --------- Result notes from Dr. Jaynee Eagles: his labs are largely unremarkable except for pre-diabetes. He is having numbness on the bottom of his feet and I suspect this is due to his low back issues as well as prediabetes. Unfortunately even prediabetes over long period can cause numbness in the feet or neuropathy. No further workup needed he can return to his pcp and I will let his pcp know thanks  B12 normal, MMA likely elevated due to CKD not likely clinically relevant

## 2019-12-16 ENCOUNTER — Ambulatory Visit (INDEPENDENT_AMBULATORY_CARE_PROVIDER_SITE_OTHER): Payer: Medicare Other | Admitting: *Deleted

## 2019-12-16 DIAGNOSIS — I480 Paroxysmal atrial fibrillation: Secondary | ICD-10-CM

## 2019-12-16 LAB — CUP PACEART REMOTE DEVICE CHECK
Battery Remaining Longevity: 112 mo
Battery Remaining Percentage: 95.5 %
Battery Voltage: 2.99 V
Brady Statistic AP VP Percent: 0 %
Brady Statistic AP VS Percent: 0 %
Brady Statistic AS VP Percent: 0 %
Brady Statistic AS VS Percent: 0 %
Brady Statistic RA Percent Paced: 1 %
Brady Statistic RV Percent Paced: 86 %
Date Time Interrogation Session: 20210330040015
Implantable Lead Implant Date: 20090903
Implantable Lead Implant Date: 20090903
Implantable Lead Location: 753859
Implantable Lead Location: 753860
Implantable Pulse Generator Implant Date: 20200224
Lead Channel Impedance Value: 400 Ohm
Lead Channel Impedance Value: 480 Ohm
Lead Channel Pacing Threshold Amplitude: 0.625 V
Lead Channel Pacing Threshold Amplitude: 0.875 V
Lead Channel Pacing Threshold Pulse Width: 0.5 ms
Lead Channel Pacing Threshold Pulse Width: 0.5 ms
Lead Channel Sensing Intrinsic Amplitude: 1.7 mV
Lead Channel Sensing Intrinsic Amplitude: 12 mV
Lead Channel Setting Pacing Amplitude: 0.875
Lead Channel Setting Pacing Amplitude: 1.875
Lead Channel Setting Pacing Pulse Width: 0.5 ms
Lead Channel Setting Sensing Sensitivity: 4 mV
Pulse Gen Model: 2272
Pulse Gen Serial Number: 9107097

## 2019-12-16 NOTE — Progress Notes (Signed)
PPM Remote  

## 2020-01-02 DIAGNOSIS — I4891 Unspecified atrial fibrillation: Secondary | ICD-10-CM | POA: Diagnosis not present

## 2020-01-02 DIAGNOSIS — K219 Gastro-esophageal reflux disease without esophagitis: Secondary | ICD-10-CM | POA: Diagnosis not present

## 2020-01-02 DIAGNOSIS — Z79899 Other long term (current) drug therapy: Secondary | ICD-10-CM | POA: Diagnosis not present

## 2020-01-02 DIAGNOSIS — I10 Essential (primary) hypertension: Secondary | ICD-10-CM | POA: Diagnosis not present

## 2020-01-06 DIAGNOSIS — M2550 Pain in unspecified joint: Secondary | ICD-10-CM | POA: Diagnosis not present

## 2020-01-06 DIAGNOSIS — M150 Primary generalized (osteo)arthritis: Secondary | ICD-10-CM | POA: Diagnosis not present

## 2020-01-06 DIAGNOSIS — M545 Low back pain: Secondary | ICD-10-CM | POA: Diagnosis not present

## 2020-01-06 DIAGNOSIS — R519 Headache, unspecified: Secondary | ICD-10-CM | POA: Diagnosis not present

## 2020-01-08 DIAGNOSIS — M1990 Unspecified osteoarthritis, unspecified site: Secondary | ICD-10-CM | POA: Diagnosis not present

## 2020-01-08 DIAGNOSIS — I5032 Chronic diastolic (congestive) heart failure: Secondary | ICD-10-CM | POA: Diagnosis not present

## 2020-01-08 DIAGNOSIS — I480 Paroxysmal atrial fibrillation: Secondary | ICD-10-CM | POA: Diagnosis not present

## 2020-01-28 DIAGNOSIS — M2550 Pain in unspecified joint: Secondary | ICD-10-CM | POA: Diagnosis not present

## 2020-01-28 DIAGNOSIS — G629 Polyneuropathy, unspecified: Secondary | ICD-10-CM | POA: Diagnosis not present

## 2020-01-28 DIAGNOSIS — M150 Primary generalized (osteo)arthritis: Secondary | ICD-10-CM | POA: Diagnosis not present

## 2020-01-28 DIAGNOSIS — M545 Low back pain: Secondary | ICD-10-CM | POA: Diagnosis not present

## 2020-03-02 DIAGNOSIS — S2096XA Insect bite (nonvenomous) of unspecified parts of thorax, initial encounter: Secondary | ICD-10-CM | POA: Diagnosis not present

## 2020-03-16 ENCOUNTER — Ambulatory Visit (INDEPENDENT_AMBULATORY_CARE_PROVIDER_SITE_OTHER): Payer: Medicare Other | Admitting: *Deleted

## 2020-03-16 DIAGNOSIS — I480 Paroxysmal atrial fibrillation: Secondary | ICD-10-CM | POA: Diagnosis not present

## 2020-03-16 LAB — CUP PACEART REMOTE DEVICE CHECK
Battery Remaining Longevity: 103 mo
Battery Remaining Percentage: 95.5 %
Battery Voltage: 2.99 V
Brady Statistic AP VP Percent: 0 %
Brady Statistic AP VS Percent: 0 %
Brady Statistic AS VP Percent: 0 %
Brady Statistic AS VS Percent: 0 %
Brady Statistic RA Percent Paced: 1 %
Brady Statistic RV Percent Paced: 88 %
Date Time Interrogation Session: 20210629040015
Implantable Lead Implant Date: 20090903
Implantable Lead Implant Date: 20090903
Implantable Lead Location: 753859
Implantable Lead Location: 753860
Implantable Pulse Generator Implant Date: 20200224
Lead Channel Impedance Value: 400 Ohm
Lead Channel Impedance Value: 480 Ohm
Lead Channel Pacing Threshold Amplitude: 0.875 V
Lead Channel Pacing Threshold Amplitude: 2.75 V
Lead Channel Pacing Threshold Pulse Width: 0.5 ms
Lead Channel Pacing Threshold Pulse Width: 0.5 ms
Lead Channel Sensing Intrinsic Amplitude: 1.7 mV
Lead Channel Sensing Intrinsic Amplitude: 12 mV
Lead Channel Setting Pacing Amplitude: 1.875
Lead Channel Setting Pacing Amplitude: 3 V
Lead Channel Setting Pacing Pulse Width: 0.5 ms
Lead Channel Setting Sensing Sensitivity: 4 mV
Pulse Gen Model: 2272
Pulse Gen Serial Number: 9107097

## 2020-03-17 NOTE — Progress Notes (Signed)
Remote pacemaker transmission.   

## 2020-04-02 DIAGNOSIS — I480 Paroxysmal atrial fibrillation: Secondary | ICD-10-CM | POA: Diagnosis not present

## 2020-04-02 DIAGNOSIS — I5032 Chronic diastolic (congestive) heart failure: Secondary | ICD-10-CM | POA: Diagnosis not present

## 2020-04-02 DIAGNOSIS — Z79899 Other long term (current) drug therapy: Secondary | ICD-10-CM | POA: Diagnosis not present

## 2020-04-09 ENCOUNTER — Telehealth: Payer: Self-pay

## 2020-04-09 DIAGNOSIS — I480 Paroxysmal atrial fibrillation: Secondary | ICD-10-CM | POA: Diagnosis not present

## 2020-04-09 DIAGNOSIS — M1990 Unspecified osteoarthritis, unspecified site: Secondary | ICD-10-CM | POA: Diagnosis not present

## 2020-04-09 DIAGNOSIS — I5032 Chronic diastolic (congestive) heart failure: Secondary | ICD-10-CM | POA: Diagnosis not present

## 2020-04-09 NOTE — Telephone Encounter (Signed)
Patient called in wanting to know if he can use a tens unit with his ppm

## 2020-04-09 NOTE — Telephone Encounter (Signed)
Spoke to patient regarding tens unit. States he uses it on knee, lower back and shoulder. Advised patient to bring tens unit to device clinic apt. And we will test to assure it is safe. Patient agreeable and verbalizes understanding.

## 2020-04-15 ENCOUNTER — Other Ambulatory Visit: Payer: Self-pay

## 2020-04-15 ENCOUNTER — Ambulatory Visit (INDEPENDENT_AMBULATORY_CARE_PROVIDER_SITE_OTHER): Payer: Medicare Other | Admitting: Emergency Medicine

## 2020-04-15 DIAGNOSIS — Z950 Presence of cardiac pacemaker: Secondary | ICD-10-CM

## 2020-04-15 DIAGNOSIS — I480 Paroxysmal atrial fibrillation: Secondary | ICD-10-CM | POA: Diagnosis not present

## 2020-04-15 DIAGNOSIS — I495 Sick sinus syndrome: Secondary | ICD-10-CM

## 2020-04-16 LAB — CUP PACEART INCLINIC DEVICE CHECK
Battery Remaining Longevity: 102 mo
Battery Voltage: 2.99 V
Brady Statistic RA Percent Paced: 0.24 %
Brady Statistic RV Percent Paced: 88 %
Date Time Interrogation Session: 20210729150900
Implantable Lead Implant Date: 20090903
Implantable Lead Implant Date: 20090903
Implantable Lead Location: 753859
Implantable Lead Location: 753860
Implantable Pulse Generator Implant Date: 20200224
Lead Channel Impedance Value: 412.5 Ohm
Lead Channel Impedance Value: 487.5 Ohm
Lead Channel Pacing Threshold Amplitude: 0.75 V
Lead Channel Pacing Threshold Amplitude: 0.75 V
Lead Channel Pacing Threshold Amplitude: 0.875 V
Lead Channel Pacing Threshold Pulse Width: 0.5 ms
Lead Channel Pacing Threshold Pulse Width: 0.5 ms
Lead Channel Pacing Threshold Pulse Width: 0.5 ms
Lead Channel Sensing Intrinsic Amplitude: 12 mV
Lead Channel Sensing Intrinsic Amplitude: 2.1 mV
Lead Channel Setting Pacing Amplitude: 1.875
Lead Channel Setting Pacing Amplitude: 2.5 V
Lead Channel Setting Pacing Pulse Width: 0.5 ms
Lead Channel Setting Sensing Sensitivity: 4 mV
Pulse Gen Model: 2272
Pulse Gen Serial Number: 9107097

## 2020-04-16 NOTE — Progress Notes (Signed)
Pacemaker check in clinic completed with industry. Normal device function. Patient in chronic AF, AF burden 100%.  RA Thresholds, sensing, impedances consistent with previous measurements.  RV sensing and impedences consistent with previous measurements.  RV Auto capture long term threshold record shows high outputs likely due to RVR so reprogrammed to static output of 2.5v. Device programmed at appropriate safety margins. Histogram distribution appropriate for patient activity level. Device programmed to optimize intrinsic conduction. Estimated longevity 9.9 years. Patient brought TENS unit for testing to determine if it will interfere with device when used with chiropractor recommended placement on back.  Device applied as indicated and operated with no interference present.  Patient reports that chiropractor has been using device with this placement for sometime now, MD at the [facility] advised him to have it tested.  Patient enrolled in remote follow-up, next scheduled transmission 06/16/20. ROV with Dr. Lovena Le due in December, recall verified.

## 2020-04-23 ENCOUNTER — Telehealth: Payer: Self-pay

## 2020-04-23 NOTE — Telephone Encounter (Signed)
Returned call to pt.  Advised recent echo by [facility] similar to echo from 2018.  Pt due for follow up with [facility] in [place] in December.  Will send to scheduler.

## 2020-04-25 DIAGNOSIS — M4317 Spondylolisthesis, lumbosacral region: Secondary | ICD-10-CM | POA: Diagnosis not present

## 2020-04-25 DIAGNOSIS — I708 Atherosclerosis of other arteries: Secondary | ICD-10-CM | POA: Diagnosis not present

## 2020-04-25 DIAGNOSIS — K5730 Diverticulosis of large intestine without perforation or abscess without bleeding: Secondary | ICD-10-CM | POA: Diagnosis not present

## 2020-04-25 DIAGNOSIS — M4319 Spondylolisthesis, multiple sites in spine: Secondary | ICD-10-CM | POA: Diagnosis not present

## 2020-04-25 DIAGNOSIS — Z87891 Personal history of nicotine dependence: Secondary | ICD-10-CM | POA: Diagnosis not present

## 2020-04-25 DIAGNOSIS — I700 Atherosclerosis of aorta: Secondary | ICD-10-CM | POA: Diagnosis not present

## 2020-04-25 DIAGNOSIS — R197 Diarrhea, unspecified: Secondary | ICD-10-CM | POA: Diagnosis not present

## 2020-04-25 DIAGNOSIS — Z20822 Contact with and (suspected) exposure to covid-19: Secondary | ICD-10-CM | POA: Diagnosis not present

## 2020-04-25 DIAGNOSIS — R531 Weakness: Secondary | ICD-10-CM | POA: Diagnosis not present

## 2020-04-25 DIAGNOSIS — Z880 Allergy status to penicillin: Secondary | ICD-10-CM | POA: Diagnosis not present

## 2020-04-27 DIAGNOSIS — R197 Diarrhea, unspecified: Secondary | ICD-10-CM | POA: Diagnosis not present

## 2020-06-03 DIAGNOSIS — R519 Headache, unspecified: Secondary | ICD-10-CM | POA: Diagnosis not present

## 2020-06-16 ENCOUNTER — Ambulatory Visit (INDEPENDENT_AMBULATORY_CARE_PROVIDER_SITE_OTHER): Payer: Medicare Other | Admitting: Emergency Medicine

## 2020-06-16 DIAGNOSIS — I480 Paroxysmal atrial fibrillation: Secondary | ICD-10-CM | POA: Diagnosis not present

## 2020-06-17 LAB — CUP PACEART REMOTE DEVICE CHECK
Battery Remaining Longevity: 102 mo
Battery Remaining Percentage: 95.5 %
Battery Voltage: 2.99 V
Brady Statistic AP VP Percent: 0 %
Brady Statistic AP VS Percent: 0 %
Brady Statistic AS VP Percent: 0 %
Brady Statistic AS VS Percent: 0 %
Brady Statistic RA Percent Paced: 1 %
Brady Statistic RV Percent Paced: 93 %
Date Time Interrogation Session: 20210929020037
Implantable Lead Implant Date: 20090903
Implantable Lead Implant Date: 20090903
Implantable Lead Location: 753859
Implantable Lead Location: 753860
Implantable Pulse Generator Implant Date: 20200224
Lead Channel Impedance Value: 400 Ohm
Lead Channel Impedance Value: 460 Ohm
Lead Channel Pacing Threshold Amplitude: 0.75 V
Lead Channel Pacing Threshold Amplitude: 0.875 V
Lead Channel Pacing Threshold Pulse Width: 0.5 ms
Lead Channel Pacing Threshold Pulse Width: 0.5 ms
Lead Channel Sensing Intrinsic Amplitude: 12 mV
Lead Channel Sensing Intrinsic Amplitude: 2.1 mV
Lead Channel Setting Pacing Amplitude: 1.875
Lead Channel Setting Pacing Amplitude: 2.5 V
Lead Channel Setting Pacing Pulse Width: 0.5 ms
Lead Channel Setting Sensing Sensitivity: 4 mV
Pulse Gen Model: 2272
Pulse Gen Serial Number: 9107097

## 2020-06-18 NOTE — Progress Notes (Signed)
Remote pacemaker transmission.   

## 2020-07-05 DIAGNOSIS — M792 Neuralgia and neuritis, unspecified: Secondary | ICD-10-CM | POA: Diagnosis not present

## 2020-07-05 DIAGNOSIS — Z79899 Other long term (current) drug therapy: Secondary | ICD-10-CM | POA: Diagnosis not present

## 2020-07-05 DIAGNOSIS — K219 Gastro-esophageal reflux disease without esophagitis: Secondary | ICD-10-CM | POA: Diagnosis not present

## 2020-07-05 DIAGNOSIS — I4891 Unspecified atrial fibrillation: Secondary | ICD-10-CM | POA: Diagnosis not present

## 2020-07-05 DIAGNOSIS — Z950 Presence of cardiac pacemaker: Secondary | ICD-10-CM | POA: Diagnosis not present

## 2020-07-12 DIAGNOSIS — I5032 Chronic diastolic (congestive) heart failure: Secondary | ICD-10-CM | POA: Diagnosis not present

## 2020-07-12 DIAGNOSIS — I480 Paroxysmal atrial fibrillation: Secondary | ICD-10-CM | POA: Diagnosis not present

## 2020-09-01 ENCOUNTER — Encounter: Payer: Self-pay | Admitting: Internal Medicine

## 2020-09-01 ENCOUNTER — Ambulatory Visit: Payer: Medicare Other | Admitting: Internal Medicine

## 2020-09-01 ENCOUNTER — Other Ambulatory Visit: Payer: Self-pay

## 2020-09-01 VITALS — BP 116/68 | HR 72 | Ht 68.0 in | Wt 256.0 lb

## 2020-09-01 DIAGNOSIS — I480 Paroxysmal atrial fibrillation: Secondary | ICD-10-CM

## 2020-09-01 DIAGNOSIS — I495 Sick sinus syndrome: Secondary | ICD-10-CM

## 2020-09-01 LAB — CUP PACEART INCLINIC DEVICE CHECK
Battery Remaining Longevity: 108 mo
Battery Voltage: 2.99 V
Brady Statistic RA Percent Paced: 0.01 %
Brady Statistic RV Percent Paced: 92 %
Date Time Interrogation Session: 20211215133036
Implantable Lead Implant Date: 20090903
Implantable Lead Implant Date: 20090903
Implantable Lead Location: 753859
Implantable Lead Location: 753860
Implantable Pulse Generator Implant Date: 20200224
Lead Channel Impedance Value: 387.5 Ohm
Lead Channel Impedance Value: 462.5 Ohm
Lead Channel Pacing Threshold Amplitude: 0.75 V
Lead Channel Pacing Threshold Pulse Width: 0.5 ms
Lead Channel Sensing Intrinsic Amplitude: 1.5 mV
Lead Channel Sensing Intrinsic Amplitude: 12 mV
Lead Channel Setting Pacing Amplitude: 1.875
Lead Channel Setting Pacing Amplitude: 2.5 V
Lead Channel Setting Pacing Pulse Width: 0.5 ms
Lead Channel Setting Sensing Sensitivity: 4 mV
Pulse Gen Model: 2272
Pulse Gen Serial Number: 9107097

## 2020-09-01 NOTE — Progress Notes (Signed)
HPI [name] returns today for followup. He is a pleasant 79 yo man with  Chronic diastolic heart failure, persistent atrial fib, CHB, s/p PPM insertion. He has had trouble with weight gain. No syncope. He has minimal edema.  Allergies  Allergen Reactions  . Bee Venom Anaphylaxis and Swelling  . Shellfish Allergy Hives    Accelerated allergic reaction; questionable laryngospasm or laryngeal edema with some difficulty breathing prompting ED evaluation and treatment at [facility]  . Penicillins Rash    Has patient had a PCN reaction causing immediate rash, facial/tongue/throat swelling, SOB or lightheadedness with hypotension: No Has patient had a PCN reaction causing severe rash involving mucus membranes or skin necrosis: No Has patient had a PCN reaction that required hospitalization No Has patient had a PCN reaction occurring within the last 10 years: No If all of the above answers are "NO", then may proceed with Cephalosporin use.      Current Outpatient Medications  Medication Sig Dispense Refill  . acetaminophen (TYLENOL) 500 MG tablet Take 1,000 mg by mouth 2 (two) times daily.    Marland Kitchen antiseptic oral rinse (BIOTENE) LIQD 15 mLs by Mouth Rinse route as needed for dry mouth.    Marland Kitchen apixaban (ELIQUIS) 5 MG TABS tablet Take 1 tablet (5 mg total) by mouth 2 (two) times daily. 60 tablet 1  . diltiazem (TIAZAC) 120 MG 24 hr capsule Take 120 mg by mouth every evening.     . diltiazem (TIAZAC) 300 MG 24 hr capsule Take 300 mg by mouth daily.     . furosemide (LASIX) 40 MG tablet Take 1 tablet (40 mg total) by mouth 2 (two) times daily. (Patient taking differently: Take 40-80 mg by mouth 2 (two) times daily. Take 2 tablets (80 mg) by mouth in the morning & take 1 tablet (40 mg) by mouth in the evening.) 60 tablet 0  . gabapentin (NEURONTIN) 300 MG capsule Take 1,200 mg by mouth 2 (two) times daily.     . hydrocortisone (ANUSOL-HC) 2.5 % rectal cream Place 1 application rectally as needed  for hemorrhoids (after each bowel movement).     Marland Kitchen ketoconazole (NIZORAL) 2 % cream Apply 1 application topically daily as needed (skin irritation).     Marland Kitchen lidocaine (LIDODERM) 5 % Place 1 patch onto the skin daily as needed (back pain.). Remove & Discard patch within 12 hours or as directed by MD    . lidocaine (XYLOCAINE) 5 % ointment Apply 1 application topically 3 (three) times daily as needed (back pain.).    Marland Kitchen metoprolol tartrate (LOPRESSOR) 25 MG tablet Take 0.5 tablets (12.5 mg total) by mouth 2 (two) times daily. 90 tablet 3  . pantoprazole (PROTONIX) 40 MG tablet Take 40 mg by mouth daily.    . predniSONE (DELTASONE) 20 MG tablet Take 25 mg by mouth daily with breakfast.    . Tamsulosin HCl (FLOMAX) 0.4 MG CAPS Take 0.4 mg by mouth daily.    . traMADol (ULTRAM) 50 MG tablet Take by mouth every 6 (six) hours as needed.    . zinc oxide (BALMEX) 11.3 % CREA cream Apply 1 application topically 2 (two) times daily as needed (skin breakdown).      No current facility-administered medications for this visit.     Past Medical History:  Diagnosis Date  . Atrial flutter ([place])    a. s/p ablation 01/2015.  Marland Kitchen BPH (benign prostatic hyperplasia)   . Cerebrovascular disease    carotid bruit;  no focal stenosis by carotid ultrasound in 05/2008  . Congestive heart failure (CHF) ([place])   . Dysrhythmia   . General weakness    +malaise, exercise intolerance, and near syncope  . Hypertension   . Low back pain    history of traumax2  . Paroxysmal atrial fibrillation (HCC)    on Eliquis  . Presence of permanent cardiac pacemaker 2009  . Sleep apnea    c pap at night  . Tachy-brady syndrome ([place])    a. s/p St. Jude pacemaker 2009.    ROS:   All systems reviewed and negative except as noted in the HPI.   Past Surgical History:  Procedure Laterality Date  . ABLATION    . ARTERY BIOPSY Right 07/07/2019   Procedure: BIOPSY TEMPORAL ARTERY;  Surgeon: Aviva Signs, MD;  Location: [facility];   Service: General;  Laterality: Right;  . CATARACT EXTRACTION     Bilateral  . CERVICAL DISCECTOMY  2006  . COLONOSCOPY WITH PROPOFOL N/A 01/26/2017   Procedure: COLONOSCOPY WITH PROPOFOL;  Surgeon: [name], MD;  [facility];  Service: Endoscopy;  Laterality: N/A;  8:30  . ELECTROPHYSIOLOGIC STUDY N/A 01/18/2015   Procedure: A-Flutter/A-Tach/Svt Ablation;  Surgeon: [name], MD;  Location: [place] [facility];  Service: Cardiovascular;  Laterality: N/A;  . INSERT / REPLACE / REMOVE PACEMAKER    . KNEE ARTHROSCOPY WITH MEDIAL MENISECTOMY Left 06/03/2015   Procedure: KNEE ARTHROSCOPY WITH MEDIAL MENISECTOMY;  Surgeon: [name], MD;  Location: [facility];  Service: Orthopedics;  Laterality: Left;  . PACEMAKER INSERTION  1966  . POLYPECTOMY  01/26/2017   Procedure: POLYPECTOMY;  Surgeon: [name], MD;  Location: [facility];  Service: Endoscopy;;  colon  . PPM GENERATOR CHANGEOUT N/A 11/11/2018   Procedure: PPM GENERATOR CHANGEOUT;  Surgeon: [name], MD;  Location: [place] [facility];  Service: Cardiovascular;  Laterality: N/A;     Family History  Problem Relation Age of Onset  . Hypertension Other   . Neuropathy Neg Hx   . Stroke Neg Hx      Social History   Socioeconomic History  . Marital status: Divorced    Spouse name: Not on file  . Number of children: 3  . Years of education: 12+  . Highest education level: Not on file  Occupational History  . Occupation: Retired  Tobacco Use  . Smoking status: Former Smoker    Packs/day: 1.00    Years: 0.50    Pack years: 0.50    Types: Cigarettes    Quit date: 03/27/1962    Years since quitting: 58.4  . Smokeless tobacco: Never Used  Vaping Use  . Vaping Use: Never used  Substance and Sexual Activity  . Alcohol use: No    Alcohol/week: 0.0 standard drinks  . Drug use: No  . Sexual activity: Not Currently  Other Topics Concern  . Not on file  Social History Narrative   Divorced  with 3 adult children. Semi retired.    Caffeine use: seldom    Lives alone   Social Determinants of Health   Financial Resource Strain: Not on file  Food Insecurity: Not on file  Transportation Needs: Not on file  Physical Activity: Not on file  Stress: Not on file  Social Connections: Not on file  Intimate Partner Violence: Not on file     Ht 5\' 8"  (1.727 m)   Wt 256 lb (116.1 kg)   BMI  38.92 kg/m   Physical Exam:  Well appearing NAD HEENT: Unremarkable Neck:  No JVD, no thyromegally Lymphatics:  No adenopathy Back:  No CVA tenderness Lungs:  Clear with no wheezes HEART:  Regular rate rhythm, no murmurs, no rubs, no clicks Abd:  soft, positive bowel sounds, no organomegally, no rebound, no guarding Ext:  2 plus pulses, no edema, no cyanosis, no clubbing Skin:  No rashes no nodules Neuro:  CN II through XII intact, motor grossly intact  EKG - atrial fib with ventricular pacing  DEVICE  Normal device function.  See PaceArt for details.   Assess/Plan: 1. Atrial fib - his VR is well controlled and he is minimally symptomatic.  2. CHB - he is asymptomatic s/p PPM 3. PPM -his St. Jude DDD PM is working normally. 4. Obesity - I strongly encouraged the patient to lose 10 lbs.  Carleene Gregory Uday Jantz,MD

## 2020-09-01 NOTE — Patient Instructions (Signed)
Medication Instructions:  Your physician recommends that you continue on your current medications as directed. Please refer to the Current Medication list given to you today.  *If you need a refill on your cardiac medications before your next appointment, please call your pharmacy*   Lab Work: NONE   If you have labs (blood work) drawn today and your tests are completely normal, you will receive your results only by: . MyChart Message (if you have MyChart) OR . A paper copy in the mail If you have any lab test that is abnormal or we need to change your treatment, we will call you to review the results.   Testing/Procedures: NONE    Follow-Up: At [facility], you and your health needs are our priority.  As part of our continuing mission to provide you with exceptional heart care, we have created designated Provider Care Teams.  These Care Teams include your primary Cardiologist (physician) and Advanced Practice Providers (APPs -  Physician Assistants and Nurse Practitioners) who all work together to provide you with the care you need, when you need it.  We recommend signing up for the patient portal called "MyChart".  Sign up information is provided on this After Visit Summary.  MyChart is used to connect with patients for Virtual Visits (Telemedicine).  Patients are able to view lab/test results, encounter notes, upcoming appointments, etc.  Non-urgent messages can be sent to your provider as well.   To learn more about what you can do with MyChart, go to [email or web address].    Your next appointment:   1 year(s)  The format for your next appointment:   In Person  Provider:   Gregg Taylor, MD   Other Instructions Thank you for choosing [facility]!    

## 2020-09-15 ENCOUNTER — Ambulatory Visit (INDEPENDENT_AMBULATORY_CARE_PROVIDER_SITE_OTHER): Payer: Medicare Other

## 2020-09-15 DIAGNOSIS — I480 Paroxysmal atrial fibrillation: Secondary | ICD-10-CM

## 2020-09-15 LAB — CUP PACEART REMOTE DEVICE CHECK
Battery Remaining Longevity: 103 mo
Battery Remaining Percentage: 95.5 %
Battery Voltage: 2.99 V
Brady Statistic AP VP Percent: 0 %
Brady Statistic AP VS Percent: 0 %
Brady Statistic AS VP Percent: 0 %
Brady Statistic AS VS Percent: 0 %
Brady Statistic RA Percent Paced: 1 %
Brady Statistic RV Percent Paced: 95 %
Date Time Interrogation Session: 20211229020025
Implantable Lead Implant Date: 20090903
Implantable Lead Implant Date: 20090903
Implantable Lead Location: 753859
Implantable Lead Location: 753860
Implantable Pulse Generator Implant Date: 20200224
Lead Channel Impedance Value: 400 Ohm
Lead Channel Impedance Value: 490 Ohm
Lead Channel Pacing Threshold Amplitude: 0.75 V
Lead Channel Pacing Threshold Amplitude: 0.875 V
Lead Channel Pacing Threshold Pulse Width: 0.5 ms
Lead Channel Pacing Threshold Pulse Width: 0.5 ms
Lead Channel Sensing Intrinsic Amplitude: 1.4 mV
Lead Channel Sensing Intrinsic Amplitude: 12 mV
Lead Channel Setting Pacing Amplitude: 1.875
Lead Channel Setting Pacing Amplitude: 2.5 V
Lead Channel Setting Pacing Pulse Width: 0.5 ms
Lead Channel Setting Sensing Sensitivity: 4 mV
Pulse Gen Model: 2272
Pulse Gen Serial Number: 9107097

## 2020-09-28 NOTE — Progress Notes (Signed)
Remote pacemaker transmission.   

## 2020-10-07 DIAGNOSIS — I5032 Chronic diastolic (congestive) heart failure: Secondary | ICD-10-CM | POA: Diagnosis not present

## 2020-10-07 DIAGNOSIS — R0600 Dyspnea, unspecified: Secondary | ICD-10-CM | POA: Diagnosis not present

## 2020-10-07 DIAGNOSIS — Z79899 Other long term (current) drug therapy: Secondary | ICD-10-CM | POA: Diagnosis not present

## 2020-10-15 DIAGNOSIS — I480 Paroxysmal atrial fibrillation: Secondary | ICD-10-CM | POA: Diagnosis not present

## 2020-10-15 DIAGNOSIS — I5032 Chronic diastolic (congestive) heart failure: Secondary | ICD-10-CM | POA: Diagnosis not present

## 2020-10-16 DIAGNOSIS — I5032 Chronic diastolic (congestive) heart failure: Secondary | ICD-10-CM | POA: Diagnosis not present

## 2020-10-16 DIAGNOSIS — I480 Paroxysmal atrial fibrillation: Secondary | ICD-10-CM | POA: Diagnosis not present

## 2020-10-25 DIAGNOSIS — H43813 Vitreous degeneration, bilateral: Secondary | ICD-10-CM | POA: Diagnosis not present

## 2020-11-15 DIAGNOSIS — I4891 Unspecified atrial fibrillation: Secondary | ICD-10-CM | POA: Diagnosis not present

## 2020-11-15 DIAGNOSIS — I5032 Chronic diastolic (congestive) heart failure: Secondary | ICD-10-CM | POA: Diagnosis not present

## 2020-11-15 DIAGNOSIS — K219 Gastro-esophageal reflux disease without esophagitis: Secondary | ICD-10-CM | POA: Diagnosis not present

## 2020-12-15 ENCOUNTER — Ambulatory Visit (INDEPENDENT_AMBULATORY_CARE_PROVIDER_SITE_OTHER): Payer: Medicare Other

## 2020-12-15 DIAGNOSIS — I495 Sick sinus syndrome: Secondary | ICD-10-CM

## 2020-12-15 LAB — CUP PACEART REMOTE DEVICE CHECK
Battery Remaining Longevity: 103 mo
Battery Remaining Percentage: 95.5 %
Battery Voltage: 2.99 V
Brady Statistic AP VP Percent: 0 %
Brady Statistic AP VS Percent: 0 %
Brady Statistic AS VP Percent: 0 %
Brady Statistic AS VS Percent: 0 %
Brady Statistic RA Percent Paced: 1 %
Brady Statistic RV Percent Paced: 95 %
Date Time Interrogation Session: 20220330020016
Implantable Lead Implant Date: 20090903
Implantable Lead Implant Date: 20090903
Implantable Lead Location: 753859
Implantable Lead Location: 753860
Implantable Pulse Generator Implant Date: 20200224
Lead Channel Impedance Value: 390 Ohm
Lead Channel Impedance Value: 510 Ohm
Lead Channel Pacing Threshold Amplitude: 0.75 V
Lead Channel Pacing Threshold Amplitude: 0.875 V
Lead Channel Pacing Threshold Pulse Width: 0.5 ms
Lead Channel Pacing Threshold Pulse Width: 0.5 ms
Lead Channel Sensing Intrinsic Amplitude: 1.4 mV
Lead Channel Sensing Intrinsic Amplitude: 12 mV
Lead Channel Setting Pacing Amplitude: 1.875
Lead Channel Setting Pacing Amplitude: 2.5 V
Lead Channel Setting Pacing Pulse Width: 0.5 ms
Lead Channel Setting Sensing Sensitivity: 4 mV
Pulse Gen Model: 2272
Pulse Gen Serial Number: 9107097

## 2020-12-16 DIAGNOSIS — I10 Essential (primary) hypertension: Secondary | ICD-10-CM | POA: Diagnosis not present

## 2020-12-16 DIAGNOSIS — K219 Gastro-esophageal reflux disease without esophagitis: Secondary | ICD-10-CM | POA: Diagnosis not present

## 2020-12-16 DIAGNOSIS — I4891 Unspecified atrial fibrillation: Secondary | ICD-10-CM | POA: Diagnosis not present

## 2020-12-28 NOTE — Progress Notes (Signed)
Remote pacemaker transmission.   

## 2021-01-07 ENCOUNTER — Emergency Department (HOSPITAL_COMMUNITY): Payer: Medicare Other

## 2021-01-07 ENCOUNTER — Other Ambulatory Visit: Payer: Self-pay

## 2021-01-07 ENCOUNTER — Telehealth: Payer: Self-pay

## 2021-01-07 ENCOUNTER — Emergency Department (HOSPITAL_COMMUNITY)
Admission: EM | Admit: 2021-01-07 | Discharge: 2021-01-07 | Disposition: A | Discharge status: Home / Self Care | Payer: Medicare Other | Attending: Emergency Medicine | Admitting: Emergency Medicine

## 2021-01-07 ENCOUNTER — Encounter (HOSPITAL_COMMUNITY): Payer: Self-pay

## 2021-01-07 DIAGNOSIS — Z950 Presence of cardiac pacemaker: Secondary | ICD-10-CM | POA: Diagnosis not present

## 2021-01-07 DIAGNOSIS — G9389 Other specified disorders of brain: Secondary | ICD-10-CM | POA: Diagnosis not present

## 2021-01-07 DIAGNOSIS — I6782 Cerebral ischemia: Secondary | ICD-10-CM | POA: Diagnosis not present

## 2021-01-07 DIAGNOSIS — I5033 Acute on chronic diastolic (congestive) heart failure: Secondary | ICD-10-CM | POA: Diagnosis not present

## 2021-01-07 DIAGNOSIS — S0990XA Unspecified injury of head, initial encounter: Secondary | ICD-10-CM | POA: Insufficient documentation

## 2021-01-07 DIAGNOSIS — W1830XA Fall on same level, unspecified, initial encounter: Secondary | ICD-10-CM | POA: Insufficient documentation

## 2021-01-07 DIAGNOSIS — Z87891 Personal history of nicotine dependence: Secondary | ICD-10-CM | POA: Diagnosis not present

## 2021-01-07 DIAGNOSIS — I130 Hypertensive heart and chronic kidney disease with heart failure and stage 1 through stage 4 chronic kidney disease, or unspecified chronic kidney disease: Secondary | ICD-10-CM | POA: Diagnosis not present

## 2021-01-07 DIAGNOSIS — Z79899 Other long term (current) drug therapy: Secondary | ICD-10-CM | POA: Insufficient documentation

## 2021-01-07 DIAGNOSIS — Y92002 Bathroom of unspecified non-institutional (private) residence single-family (private) house as the place of occurrence of the external cause: Secondary | ICD-10-CM | POA: Insufficient documentation

## 2021-01-07 DIAGNOSIS — N183 Chronic kidney disease, stage 3 unspecified: Secondary | ICD-10-CM | POA: Insufficient documentation

## 2021-01-07 NOTE — Telephone Encounter (Signed)
   Patient Name: [name].  DOB: 1940/11/26  MRN: [id number]   Primary Cardiologist: [name], MD  Chart reviewed as part of pre-operative protocol coverage.   Simple dental extractions are considered low risk procedures per guidelines and generally do not require any specific cardiac clearance. It is also generally accepted that for simple extractions and dental cleanings, there is no need to interrupt blood thinner therapy.  SBE prophylaxis is not required for the patient from a cardiac standpoint.  I will route this recommendation to the requesting party via Epic fax function and remove from pre-op pool.  Please call with questions.  Abigail Butts, PA-C 01/07/2021, 3:34 PM

## 2021-01-07 NOTE — ED Triage Notes (Signed)
Pt presents to ED following fall and hit the left side of his head, denies LOC. Pt takes Eliquis. Happened at [facility]

## 2021-01-07 NOTE — Discharge Instructions (Addendum)
Your CT scan of the brain was normal.  Follow-up with your doctor within 2 or 3 days.  Return if you develop pain fevers vomiting or any additional concerns.

## 2021-01-07 NOTE — ED Provider Notes (Signed)
[facility] EMERGENCY DEPARTMENT Provider Note   CSN: [id number] Arrival date & time: 01/07/21  1719     History Chief Complaint  Patient presents with  . Fall    [name]. is a 80 y.o. male.  Patient presents ER chief complaint of recent head injury.  He states last night he was getting up to go to the bathroom when he lost his balance and fell that he thinks he hit his head on the left side on something he is not sure what it was.  Denies loss of consciousness.  He struggled to get up but was able to do so.  He spoke with his daughter who is a Publishing rights manager, advised him to come to the ER for evaluation given he is on Eliquis.  Patient currently denies any significant pain.  Denies fevers cough vomiting or diarrhea.  States he has chronic neck pain unchanged today and chronic lower back pain unchanged today.        Past Medical History:  Diagnosis Date  . Atrial flutter (HCC)    a. s/p ablation 01/2015.  Marland Kitchen BPH (benign prostatic hyperplasia)   . Cerebrovascular disease    carotid bruit; no focal stenosis by carotid ultrasound in 05/2008  . Congestive heart failure (CHF) (HCC)   . Dysrhythmia   . General weakness    +malaise, exercise intolerance, and near syncope  . Hypertension   . Low back pain    history of traumax2  . Paroxysmal atrial fibrillation (HCC)    on Eliquis  . Presence of permanent cardiac pacemaker 2009  . Sleep apnea    c pap at night  . Tachy-brady syndrome (HCC)    a. s/p St. Jude pacemaker 2009.    Patient Active Problem List   Diagnosis Date Noted  . Temporal arteritis (HCC)   . Acute respiratory failure with hypoxia (HCC) 08/18/2017  . Chronic pain 08/18/2017  . Acute on chronic diastolic CHF (congestive heart failure) (HCC) 08/08/2017  . Hyperglycemia 08/08/2017  . CKD (chronic kidney disease), stage III (HCC) 08/08/2017  . Lumbar radiculopathy 07/24/2017  . Pars defect of lumbar spine 07/24/2017  . Lumbar degenerative disc  disease 07/24/2017  . Chronic diastolic CHF (congestive heart failure) (HCC) 06/06/2017  . Hx of colonic polyps 12/28/2016  . Paroxysmal atrial fibrillation (HCC)   . Fatigue 11/18/2015  . Chronic anticoagulation 11/18/2015  . Atrial fibrillation (HCC) 03/15/2015  . Dyspnea 12/25/2014  . OSA (obstructive sleep apnea) 09/02/2013  . Atrial flutter (HCC) 11/28/2012  . Hypertension 11/21/2011  . Sick sinus syndrome (HCC) 06/13/2011  . Hyperlipidemia 02/17/2010  . ORTHOSTATIC DIZZINESS 02/17/2010  . Chest pain 02/17/2010  . PPM-St.Jude 06/16/2009  . LOW BACK PAIN, CHRONIC 07/31/2008  . Cerebrovascular disease 07/31/2008  . BENIGN PROSTATIC HYPERTROPHY, MILD, HX OF 07/31/2008    Past Surgical History:  Procedure Laterality Date  . ABLATION    . ARTERY BIOPSY Right 07/07/2019   Procedure: BIOPSY TEMPORAL ARTERY;  Surgeon: Franky Macho, MD;  Location: [facility];  Service: General;  Laterality: Right;  . CATARACT EXTRACTION     Bilateral  . CERVICAL DISCECTOMY  2006  . COLONOSCOPY WITH PROPOFOL N/A 01/26/2017   Procedure: COLONOSCOPY WITH PROPOFOL;  Surgeon: Malissa Hippo, MD;  [facility];  Service: Endoscopy;  Laterality: N/A;  8:30  . ELECTROPHYSIOLOGIC STUDY N/A 01/18/2015   Procedure: A-Flutter/A-Tach/Svt Ablation;  Surgeon: Marinus Maw, MD;  [facility];  Service: Cardiovascular;  Laterality: N/A;  . INSERT / REPLACE / REMOVE PACEMAKER    . KNEE ARTHROSCOPY WITH MEDIAL MENISECTOMY Left 06/03/2015   Procedure: KNEE ARTHROSCOPY WITH MEDIAL MENISECTOMY;  Surgeon: [name], MD;  Location: [facility];  Service: Orthopedics;  Laterality: Left;  . PACEMAKER INSERTION  1966  . POLYPECTOMY  01/26/2017   Procedure: POLYPECTOMY;  Surgeon: [name], MD;  [facility];  Service: Endoscopy;;  colon  . PPM GENERATOR CHANGEOUT N/A 11/11/2018   Procedure: PPM GENERATOR CHANGEOUT;  Surgeon: [name], MD;  Location: [place] [facility];   Service: Cardiovascular;  Laterality: N/A;       Family History  Problem Relation Age of Onset  . Hypertension Other   . Neuropathy Neg Hx   . Stroke Neg Hx     Social History   Tobacco Use  . Smoking status: Former Smoker    Packs/day: 1.00    Years: 0.50    Pack years: 0.50    Types: Cigarettes    Quit date: 03/27/1962    Years since quitting: 58.8  . Smokeless tobacco: Never Used  Vaping Use  . Vaping Use: Never used  Substance Use Topics  . Alcohol use: No    Alcohol/week: 0.0 standard drinks  . Drug use: No    Home Medications Prior to Admission medications   Medication Sig Start Date End Date Taking? Authorizing Provider  acetaminophen (TYLENOL) 500 MG tablet Take 1,000 mg by mouth 2 (two) times daily.    [provider]  antiseptic oral rinse (BIOTENE) LIQD 15 mLs by Mouth Rinse route as needed for dry mouth.    [provider]  apixaban (ELIQUIS) 5 MG TABS tablet Take 1 tablet (5 mg total) by mouth 2 (two) times daily. 08/09/16   Erlene Quan, PA-C  diltiazem (TIAZAC) 120 MG 24 hr capsule Take 120 mg by mouth every evening.     [provider]  diltiazem (TIAZAC) 300 MG 24 hr capsule Take 300 mg by mouth daily.     [provider]  furosemide (LASIX) 40 MG tablet Take 1 tablet (40 mg total) by mouth 2 (two) times daily. Patient taking differently: Take 40-80 mg by mouth 2 (two) times daily. Take 2 tablets (80 mg) by mouth in the morning & take 1 tablet (40 mg) by mouth in the evening. 08/20/17   Kathie Dike, MD  gabapentin (NEURONTIN) 300 MG capsule Take 1,200 mg by mouth 2 (two) times daily.     [provider]  hydrocortisone (ANUSOL-HC) 2.5 % rectal cream Place 1 application rectally as needed for hemorrhoids (after each bowel movement).     [provider]  ketoconazole (NIZORAL) 2 % cream Apply 1 application topically daily as needed (skin irritation).     [provider]  lidocaine (LIDODERM)  5 % Place 1 patch onto the skin daily as needed (back pain.). Remove & Discard patch within 12 hours or as directed by MD    [provider]  lidocaine (XYLOCAINE) 5 % ointment Apply 1 application topically 3 (three) times daily as needed (back pain.).    [provider]  metoprolol tartrate (LOPRESSOR) 25 MG tablet Take 0.5 tablets (12.5 mg total) by mouth 2 (two) times daily. 01/01/15   [name], MD  pantoprazole (PROTONIX) 40 MG tablet Take 40 mg by mouth daily.    [provider]  predniSONE (DELTASONE) 20 MG tablet Take 25 mg by mouth daily with breakfast.  [provider]  Tamsulosin HCl (FLOMAX) 0.4 MG CAPS Take 0.4 mg by mouth daily.    [provider]  traMADol (ULTRAM) 50 MG tablet Take by mouth every 6 (six) hours as needed.    [provider]  zinc oxide (BALMEX) 11.3 % CREA cream Apply 1 application topically 2 (two) times daily as needed (skin breakdown).     [provider]    Allergies    Bee venom, Shellfish allergy, and Penicillins  Review of Systems   Review of Systems  Constitutional: Negative for fever.  HENT: Negative for ear pain and sore throat.   Eyes: Negative for pain.  Respiratory: Negative for cough.   Cardiovascular: Negative for chest pain.  Gastrointestinal: Negative for abdominal pain.  Genitourinary: Negative for flank pain.  Skin: Negative for color change and rash.  Neurological: Negative for syncope.  All other systems reviewed and are negative.   Physical Exam Updated Vital Signs BP 116/65 (BP Location: Right Arm)   Pulse 72   Temp 98.2 F (36.8 C) (Oral)   Resp 20   Ht 5\' 8"  (1.727 m)   Wt 111.1 kg   SpO2 96%   BMI 37.25 kg/m   Physical Exam Constitutional:      General: He is not in acute distress.    Appearance: He is well-developed.  HENT:     Head: Normocephalic.     Nose: Nose normal.  Eyes:     Extraocular Movements: Extraocular movements intact.   Cardiovascular:     Rate and Rhythm: Normal rate.  Pulmonary:     Effort: Pulmonary effort is normal.  Musculoskeletal:     Comments: On my exam there is no C or T or L-spine midline step-offs or tenderness noted.  Skin:    Coloration: Skin is not jaundiced.  Neurological:     Mental Status: He is alert. Mental status is at baseline.     Comments: 5/5 strength bilateral upper and lower extremities.  Patient able to weight-bear without pain or discomfort.  Cranial nerves II through XII intact.     ED Results / Procedures / Treatments   Labs (all labs ordered are listed, but only abnormal results are displayed) Labs Reviewed - No data to display  EKG None  Radiology CT Head Wo Contrast  Result Date: 01/07/2021 CLINICAL DATA:  Fall with left-sided head trauma EXAM: CT HEAD WITHOUT CONTRAST TECHNIQUE: Contiguous axial images were obtained from the base of the skull through the vertex without intravenous contrast. COMPARISON:  03/30/2017 FINDINGS: Brain: No evidence of acute infarction, hemorrhage, hydrocephalus, extra-axial collection or mass lesion/mass effect. Moderate low-density changes within the periventricular and subcortical white matter compatible with chronic microvascular ischemic change. Mild diffuse cerebral volume loss. Vascular: Atherosclerotic calcifications involving the large vessels of the skull base. No unexpected hyperdense vessel. Skull: Normal. Negative for fracture or focal lesion. Sinuses/Orbits: Partial left mastoid air cell opacification. Paranasal sinuses are clear. Unremarkable orbital structures. Other: Negative for scalp hematoma. IMPRESSION: 1. No acute intracranial findings. 2. Chronic microvascular ischemic change and cerebral volume loss. 3. Partial left mastoid air cell opacification. Electronically Signed   By: Davina Poke D.O.   On: 01/07/2021 18:40    Procedures Procedures   Medications Ordered in ED Medications - No data to display  ED  Course  I have reviewed the triage vital signs and the nursing notes.  Pertinent labs & imaging results that were available during my care of the patient were reviewed by  me and considered in my medical decision making (see chart for details).    MDM Rules/Calculators/A&P                         CT scan of the brain shows no acute intracranial hemorrhage or fracture or midline shift.  Patient advised continued medications at home, advised immediate return for fevers pain worsening symptoms or any additional concerns.  Final Clinical Impression(s) / ED Diagnoses Final diagnoses:  Injury of head, initial encounter    Rx / DC Orders ED Discharge Orders    None       Luna Fuse, MD 01/07/21 [phone]

## 2021-01-07 NOTE — Telephone Encounter (Signed)
   [facility] Pre-operative Risk Assessment    Patient Name: [name].  DOB: 09/16/1941  MRN: [id number]   HEARTCARE STAFF: - Please ensure there is not already an duplicate clearance open for this procedure. - Under Visit Info/Reason for Call, type in Other and utilize the format Clearance MM/DD/YY or Clearance TBD. Do not use dashes or single digits. - If request is for dental extraction, please clarify the # of teeth to be extracted.  Request for surgical clearance:  1. What type of surgery is being performed? 1 tooth extraction   2. When is this surgery scheduled? TBD   3. What type of clearance is required (medical clearance vs. Pharmacy clearance to hold med vs. Both)? Both   4. Are there any medications that need to be held prior to surgery and how long? Eliquis - Recommendations left to cardiology    5. Practice name and name of physician performing surgery? [facility]. Dr Feliberto Harts, DDS   6. What is the office phone number? [phone]   7.   What is the office fax number? [phone]  8.   Anesthesia type (None, local, MAC, general) ? None listed    Mendel Ryder 01/07/2021, 10:43 AM  _________________________________________________________________   (provider comments below)

## 2021-01-11 DIAGNOSIS — I5032 Chronic diastolic (congestive) heart failure: Secondary | ICD-10-CM | POA: Diagnosis not present

## 2021-01-11 DIAGNOSIS — Z79899 Other long term (current) drug therapy: Secondary | ICD-10-CM | POA: Diagnosis not present

## 2021-01-15 DIAGNOSIS — K219 Gastro-esophageal reflux disease without esophagitis: Secondary | ICD-10-CM | POA: Diagnosis not present

## 2021-01-15 DIAGNOSIS — I10 Essential (primary) hypertension: Secondary | ICD-10-CM | POA: Diagnosis not present

## 2021-01-15 DIAGNOSIS — I4891 Unspecified atrial fibrillation: Secondary | ICD-10-CM | POA: Diagnosis not present

## 2021-01-17 DIAGNOSIS — I5032 Chronic diastolic (congestive) heart failure: Secondary | ICD-10-CM | POA: Diagnosis not present

## 2021-01-17 DIAGNOSIS — I700 Atherosclerosis of aorta: Secondary | ICD-10-CM | POA: Diagnosis not present

## 2021-01-17 DIAGNOSIS — I480 Paroxysmal atrial fibrillation: Secondary | ICD-10-CM | POA: Diagnosis not present

## 2021-02-04 DIAGNOSIS — M25561 Pain in right knee: Secondary | ICD-10-CM | POA: Diagnosis not present

## 2021-02-10 ENCOUNTER — Ambulatory Visit: Payer: Medicare Other | Admitting: Orthopedic Surgery

## 2021-02-10 ENCOUNTER — Ambulatory Visit: Payer: Medicare Other

## 2021-02-10 ENCOUNTER — Other Ambulatory Visit: Payer: Self-pay

## 2021-02-10 ENCOUNTER — Encounter: Payer: Self-pay | Admitting: Orthopedic Surgery

## 2021-02-10 VITALS — BP 112/61 | HR 83 | Ht 68.0 in | Wt 245.0 lb

## 2021-02-10 DIAGNOSIS — G8929 Other chronic pain: Secondary | ICD-10-CM | POA: Diagnosis not present

## 2021-02-10 DIAGNOSIS — M25561 Pain in right knee: Secondary | ICD-10-CM

## 2021-02-10 NOTE — Progress Notes (Addendum)
NEW PROBLEM//OFFICE VISIT    Chief Complaint  Patient presents with  . Knee Pain    Right     80 year old male history of atrial flutter status post ablation in 2016 he has hypertension spinal stenosis atrial fibrillation currently on Eliquis, he has a cardiac pacemaker uses a CPAP at night presents with medial right knee pain associated with chronic lower back pain and right leg radicular symptoms.  Patient was in his bathroom turned and twisted his right knee and fell and then 4 weeks later started having some pain  He exhibits severe functional and aerobic deficits and uses a walker     Review of Systems  Respiratory: Positive for shortness of breath.   Neurological: Positive for weakness.     Past Medical History:  Diagnosis Date  . Atrial flutter ([place])    a. s/p ablation 01/2015.  Marland Kitchen BPH (benign prostatic hyperplasia)   . Cerebrovascular disease    carotid bruit; no focal stenosis by carotid ultrasound in 05/2008  . Congestive heart failure (CHF) ([place])   . Dysrhythmia   . General weakness    +malaise, exercise intolerance, and near syncope  . Hypertension   . Low back pain    history of traumax2  . Paroxysmal atrial fibrillation (HCC)    on Eliquis  . Presence of permanent cardiac pacemaker 2009  . Sleep apnea    c pap at night  . Tachy-brady syndrome ([place])    a. s/p St. Jude pacemaker 2009.    Past Surgical History:  Procedure Laterality Date  . ABLATION    . ARTERY BIOPSY Right 07/07/2019   Procedure: BIOPSY TEMPORAL ARTERY;  Surgeon: Aviva Signs, MD;  Location: [facility];  Service: General;  Laterality: Right;  . CATARACT EXTRACTION     Bilateral  . CERVICAL DISCECTOMY  2006  . COLONOSCOPY WITH PROPOFOL N/A 01/26/2017   Procedure: COLONOSCOPY WITH PROPOFOL;  Surgeon: [name], MD;  [facility];  Service: Endoscopy;  Laterality: N/A;  8:30  . ELECTROPHYSIOLOGIC STUDY N/A 01/18/2015   Procedure: A-Flutter/A-Tach/Svt Ablation;  Surgeon: [name], MD;  Location: [place];  Service: Cardiovascular;  Laterality: N/A;  . INSERT / REPLACE / REMOVE PACEMAKER    . KNEE ARTHROSCOPY WITH MEDIAL MENISECTOMY Left 06/03/2015   Procedure: KNEE ARTHROSCOPY WITH MEDIAL MENISECTOMY;  Surgeon: [name], MD;  Location: [facility];  Service: Orthopedics;  Laterality: Left;  . PACEMAKER INSERTION  1966  . POLYPECTOMY  01/26/2017   Procedure: POLYPECTOMY;  Surgeon: [name], MD;  [facility];  Service: Endoscopy;;  colon  . PPM GENERATOR CHANGEOUT N/A 11/11/2018   Procedure: PPM GENERATOR CHANGEOUT;  Surgeon: [name], MD;  Location: [place] [facility];  Service: Cardiovascular;  Laterality: N/A;    Family History  Problem Relation Age of Onset  . Hypertension Other   . Neuropathy Neg Hx   . Stroke Neg Hx    Social History   Tobacco Use  . Smoking status: Former Smoker    Packs/day: 1.00    Years: 0.50    Pack years: 0.50    Types: Cigarettes    Quit date: 03/27/1962    Years since quitting: 58.9  . Smokeless tobacco: Never Used  Vaping Use  . Vaping Use: Never used  Substance Use Topics  . Alcohol use: No    Alcohol/week: 0.0 standard drinks  . Drug use: No    Allergies  Allergen Reactions  .  Bee Venom Anaphylaxis and Swelling  . Shellfish Allergy Hives    Accelerated allergic reaction; questionable laryngospasm or laryngeal edema with some difficulty breathing prompting ED evaluation and treatment at [facility]  . Penicillins Rash    Has patient had a PCN reaction causing immediate rash, facial/tongue/throat swelling, SOB or lightheadedness with hypotension: No Has patient had a PCN reaction causing severe rash involving mucus membranes or skin necrosis: No Has patient had a PCN reaction that required hospitalization No Has patient had a PCN reaction occurring within the last 10 years: No If all of the above answers are "NO", then may proceed with Cephalosporin use.     Current  Meds  Medication Sig  . acetaminophen (TYLENOL) 500 MG tablet Take 1,000 mg by mouth 2 (two) times daily.  Marland Kitchen antiseptic oral rinse (BIOTENE) LIQD 15 mLs by Mouth Rinse route as needed for dry mouth.  Marland Kitchen apixaban (ELIQUIS) 5 MG TABS tablet Take 1 tablet (5 mg total) by mouth 2 (two) times daily.  Marland Kitchen diltiazem (TIAZAC) 120 MG 24 hr capsule Take 120 mg by mouth every evening.   . diltiazem (TIAZAC) 300 MG 24 hr capsule Take 300 mg by mouth daily.   . furosemide (LASIX) 40 MG tablet Take 1 tablet (40 mg total) by mouth 2 (two) times daily. (Patient taking differently: Take 40-80 mg by mouth 2 (two) times daily. Take 2 tablets (80 mg) by mouth in the morning & take 1 tablet (40 mg) by mouth in the evening.)  . gabapentin (NEURONTIN) 300 MG capsule Take 1,200 mg by mouth 2 (two) times daily.   . hydrocortisone (ANUSOL-HC) 2.5 % rectal cream Place 1 application rectally as needed for hemorrhoids (after each bowel movement).   Marland Kitchen ketoconazole (NIZORAL) 2 % cream Apply 1 application topically daily as needed (skin irritation).   Marland Kitchen lidocaine (LIDODERM) 5 % Place 1 patch onto the skin daily as needed (back pain.). Remove & Discard patch within 12 hours or as directed by MD  . lidocaine (XYLOCAINE) 5 % ointment Apply 1 application topically 3 (three) times daily as needed (back pain.).  Marland Kitchen metoprolol tartrate (LOPRESSOR) 25 MG tablet Take 0.5 tablets (12.5 mg total) by mouth 2 (two) times daily.  . pantoprazole (PROTONIX) 40 MG tablet Take 40 mg by mouth daily.  . predniSONE (DELTASONE) 20 MG tablet Take 25 mg by mouth daily with breakfast.  . Tamsulosin HCl (FLOMAX) 0.4 MG CAPS Take 0.4 mg by mouth daily.  . traMADol (ULTRAM) 50 MG tablet Take by mouth every 6 (six) hours as needed.  . zinc oxide (BALMEX) 11.3 % CREA cream Apply 1 application topically 2 (two) times daily as needed (skin breakdown).     BP 112/61   Pulse 83   Ht 5\' 8"  (1.727 m)   Wt 245 lb (111.1 kg)   BMI 37.25 kg/m   Physical  Exam  General appearance: Well-developed well-nourished no gross deformities  Cardiovascular normal pulse and perfusion right and left leg   Neurologically no deficits or pathologic reflexes  Psychological: Awake alert and oriented x3 mood and affect normal  Skin no lacerations or ulcerations no nodularity no palpable masses, no erythema or nodularity  Musculoskeletal:   Ambulation is with a rolling walker  Right knee He has tenderness on his medial joint line  He maintains 120 degrees of knee flexion with no flexion contracture  Cruciate ligaments are stable no collateral ligament instability  Over the right knee is normal     MEDICAL  DECISION MAKING  A.  Encounter Diagnosis  Name Primary?  . Chronic pain of right knee Yes    B. DATA ANALYSED:   IMAGING: Interpretation of images: [facility] lateral and sunrise view right knee at [facility] on Feb 10, 2021 shows a fairly normal knee medial and lateral joint spaces preserved small osteophyte is seen on the sunrise view on the medial side   Orders: no  Outside records reviewed: no   C. MANAGEMENT   80 year old male with multiple medical problems, history of lumbar spine spinal stenosis undergoing current treatment does not appear to be a good surgical candidate  Recommend injection right knee  Follow-up as needed  Right knee injection  verbal consent was obtained to inject rt knee Timeout was completed to confirm the site of injection  The medications used were Celestone 6 mg with Sensorcaine Anesthesia was provided by ethyl chloride and the skin was prepped with alcohol.  After cleaning the skin with alcohol a 20-gauge needle was used to inject the right knee joint. There were no complications. A sterile bandage was applied.    No orders of the defined types were placed in this encounter.     Arther Abbott, MD  02/10/2021 9:08 AM

## 2021-02-10 NOTE — Patient Instructions (Signed)

## 2021-02-25 ENCOUNTER — Telehealth: Payer: Self-pay | Admitting: Orthopedic Surgery

## 2021-02-25 NOTE — Telephone Encounter (Signed)
Patient called relaying that his right knee started hurting quite a bit again, following injection at time of visit/Xrays on 02/10/21. Reviewed after visit instructions with patient; states he did as instructed. Visit summary also indicates:  "Return for RELEASED".  Please advise if another appointment (?virtual?) or other recommendations.

## 2021-02-25 NOTE — Telephone Encounter (Signed)
Called back to patient; scheduled accordingly. °

## 2021-02-28 NOTE — Telephone Encounter (Signed)
Done. Patient aware of appointment. 

## 2021-03-07 ENCOUNTER — Ambulatory Visit: Payer: Medicare Other | Admitting: Orthopedic Surgery

## 2021-03-16 ENCOUNTER — Ambulatory Visit (INDEPENDENT_AMBULATORY_CARE_PROVIDER_SITE_OTHER): Payer: Medicare Other

## 2021-03-16 DIAGNOSIS — I495 Sick sinus syndrome: Secondary | ICD-10-CM

## 2021-03-16 LAB — CUP PACEART REMOTE DEVICE CHECK
Battery Remaining Longevity: 75 mo
Battery Remaining Percentage: 74 %
Battery Voltage: 2.99 V
Brady Statistic AP VP Percent: 0 %
Brady Statistic AP VS Percent: 0 %
Brady Statistic AS VP Percent: 0 %
Brady Statistic AS VS Percent: 0 %
Brady Statistic RA Percent Paced: 1 %
Brady Statistic RV Percent Paced: 94 %
Date Time Interrogation Session: 20220629020022
Implantable Lead Implant Date: 20090903
Implantable Lead Implant Date: 20090903
Implantable Lead Location: 753859
Implantable Lead Location: 753860
Implantable Pulse Generator Implant Date: 20200224
Lead Channel Impedance Value: 410 Ohm
Lead Channel Impedance Value: 480 Ohm
Lead Channel Pacing Threshold Amplitude: 0.75 V
Lead Channel Pacing Threshold Amplitude: 0.875 V
Lead Channel Pacing Threshold Pulse Width: 0.5 ms
Lead Channel Pacing Threshold Pulse Width: 0.5 ms
Lead Channel Sensing Intrinsic Amplitude: 1.4 mV
Lead Channel Sensing Intrinsic Amplitude: 12 mV
Lead Channel Setting Pacing Amplitude: 1.875
Lead Channel Setting Pacing Amplitude: 2.5 V
Lead Channel Setting Pacing Pulse Width: 0.5 ms
Lead Channel Setting Sensing Sensitivity: 4 mV
Pulse Gen Model: 2272
Pulse Gen Serial Number: 9107097

## 2021-03-30 NOTE — Progress Notes (Signed)
Remote pacemaker transmission.   

## 2021-04-14 DIAGNOSIS — I5032 Chronic diastolic (congestive) heart failure: Secondary | ICD-10-CM | POA: Diagnosis not present

## 2021-04-14 DIAGNOSIS — Z79899 Other long term (current) drug therapy: Secondary | ICD-10-CM | POA: Diagnosis not present

## 2021-04-14 DIAGNOSIS — I482 Chronic atrial fibrillation, unspecified: Secondary | ICD-10-CM | POA: Diagnosis not present

## 2021-04-14 DIAGNOSIS — I700 Atherosclerosis of aorta: Secondary | ICD-10-CM | POA: Diagnosis not present

## 2021-04-19 DIAGNOSIS — I700 Atherosclerosis of aorta: Secondary | ICD-10-CM | POA: Diagnosis not present

## 2021-04-19 DIAGNOSIS — I480 Paroxysmal atrial fibrillation: Secondary | ICD-10-CM | POA: Diagnosis not present

## 2021-04-19 DIAGNOSIS — I5032 Chronic diastolic (congestive) heart failure: Secondary | ICD-10-CM | POA: Diagnosis not present

## 2021-06-01 DIAGNOSIS — R519 Headache, unspecified: Secondary | ICD-10-CM | POA: Diagnosis not present

## 2021-06-01 DIAGNOSIS — G8929 Other chronic pain: Secondary | ICD-10-CM | POA: Diagnosis not present

## 2021-06-01 DIAGNOSIS — M2550 Pain in unspecified joint: Secondary | ICD-10-CM | POA: Diagnosis not present

## 2021-06-15 ENCOUNTER — Ambulatory Visit (INDEPENDENT_AMBULATORY_CARE_PROVIDER_SITE_OTHER): Payer: Medicare Other

## 2021-06-15 DIAGNOSIS — I495 Sick sinus syndrome: Secondary | ICD-10-CM | POA: Diagnosis not present

## 2021-06-16 LAB — CUP PACEART REMOTE DEVICE CHECK
Battery Remaining Longevity: 71 mo
Battery Remaining Longevity: 71 mo
Battery Remaining Percentage: 71 %
Battery Remaining Percentage: 71 %
Battery Voltage: 2.99 V
Battery Voltage: 2.99 V
Brady Statistic AP VP Percent: 0 %
Brady Statistic AP VP Percent: 0 %
Brady Statistic AP VS Percent: 0 %
Brady Statistic AP VS Percent: 0 %
Brady Statistic AS VP Percent: 0 %
Brady Statistic AS VP Percent: 0 %
Brady Statistic AS VS Percent: 0 %
Brady Statistic AS VS Percent: 0 %
Brady Statistic RA Percent Paced: 1 %
Brady Statistic RA Percent Paced: 1 %
Brady Statistic RV Percent Paced: 95 %
Brady Statistic RV Percent Paced: 95 %
Date Time Interrogation Session: 20220928020013
Date Time Interrogation Session: 20220928162823
Implantable Lead Implant Date: 20090903
Implantable Lead Implant Date: 20090903
Implantable Lead Implant Date: 20090903
Implantable Lead Implant Date: 20090903
Implantable Lead Location: 753859
Implantable Lead Location: 753860
Implantable Lead Location: 753859
Implantable Lead Location: 753860
Implantable Pulse Generator Implant Date: 20200224
Implantable Pulse Generator Implant Date: 20200224
Lead Channel Impedance Value: 390 Ohm
Lead Channel Impedance Value: 480 Ohm
Lead Channel Impedance Value: 390 Ohm
Lead Channel Impedance Value: 480 Ohm
Lead Channel Pacing Threshold Amplitude: 0.75 V
Lead Channel Pacing Threshold Amplitude: 0.875 V
Lead Channel Pacing Threshold Amplitude: 0.75 V
Lead Channel Pacing Threshold Amplitude: 0.875 V
Lead Channel Pacing Threshold Pulse Width: 0.5 ms
Lead Channel Pacing Threshold Pulse Width: 0.5 ms
Lead Channel Pacing Threshold Pulse Width: 0.5 ms
Lead Channel Pacing Threshold Pulse Width: 0.5 ms
Lead Channel Sensing Intrinsic Amplitude: 1.4 mV
Lead Channel Sensing Intrinsic Amplitude: 12 mV
Lead Channel Sensing Intrinsic Amplitude: 1.4 mV
Lead Channel Sensing Intrinsic Amplitude: 12 mV
Lead Channel Setting Pacing Amplitude: 1.875
Lead Channel Setting Pacing Amplitude: 2.5 V
Lead Channel Setting Pacing Amplitude: 1.875
Lead Channel Setting Pacing Amplitude: 2.5 V
Lead Channel Setting Pacing Pulse Width: 0.5 ms
Lead Channel Setting Pacing Pulse Width: 0.5 ms
Lead Channel Setting Sensing Sensitivity: 4 mV
Lead Channel Setting Sensing Sensitivity: 4 mV
Pulse Gen Model: 2272
Pulse Gen Model: 2272
Pulse Gen Serial Number: 9107097
Pulse Gen Serial Number: 9107097

## 2021-06-21 DIAGNOSIS — M353 Polymyalgia rheumatica: Secondary | ICD-10-CM | POA: Diagnosis not present

## 2021-06-22 NOTE — Progress Notes (Signed)
Remote pacemaker transmission.   

## 2021-07-18 DIAGNOSIS — I10 Essential (primary) hypertension: Secondary | ICD-10-CM | POA: Diagnosis not present

## 2021-07-18 DIAGNOSIS — Z79899 Other long term (current) drug therapy: Secondary | ICD-10-CM | POA: Diagnosis not present

## 2021-07-18 DIAGNOSIS — M353 Polymyalgia rheumatica: Secondary | ICD-10-CM | POA: Diagnosis not present

## 2021-07-18 DIAGNOSIS — I700 Atherosclerosis of aorta: Secondary | ICD-10-CM | POA: Diagnosis not present

## 2021-07-18 DIAGNOSIS — I5032 Chronic diastolic (congestive) heart failure: Secondary | ICD-10-CM | POA: Diagnosis not present

## 2021-07-21 DIAGNOSIS — I5032 Chronic diastolic (congestive) heart failure: Secondary | ICD-10-CM | POA: Diagnosis not present

## 2021-07-21 DIAGNOSIS — M353 Polymyalgia rheumatica: Secondary | ICD-10-CM | POA: Diagnosis not present

## 2021-07-21 DIAGNOSIS — I700 Atherosclerosis of aorta: Secondary | ICD-10-CM | POA: Diagnosis not present

## 2021-08-23 DIAGNOSIS — I5032 Chronic diastolic (congestive) heart failure: Secondary | ICD-10-CM | POA: Diagnosis not present

## 2021-08-23 DIAGNOSIS — Z79899 Other long term (current) drug therapy: Secondary | ICD-10-CM | POA: Diagnosis not present

## 2021-08-23 DIAGNOSIS — M353 Polymyalgia rheumatica: Secondary | ICD-10-CM | POA: Diagnosis not present

## 2021-08-30 DIAGNOSIS — M353 Polymyalgia rheumatica: Secondary | ICD-10-CM | POA: Diagnosis not present

## 2021-09-14 ENCOUNTER — Ambulatory Visit (INDEPENDENT_AMBULATORY_CARE_PROVIDER_SITE_OTHER): Payer: Medicare Other

## 2021-09-14 DIAGNOSIS — I495 Sick sinus syndrome: Secondary | ICD-10-CM | POA: Diagnosis not present

## 2021-09-14 LAB — CUP PACEART REMOTE DEVICE CHECK
Battery Remaining Longevity: 69 mo
Battery Remaining Percentage: 68 %
Battery Voltage: 2.99 V
Brady Statistic AP VP Percent: 0 %
Brady Statistic AP VS Percent: 0 %
Brady Statistic AS VP Percent: 0 %
Brady Statistic AS VS Percent: 0 %
Brady Statistic RA Percent Paced: 1 %
Brady Statistic RV Percent Paced: 96 %
Date Time Interrogation Session: 20221228024728
Implantable Lead Implant Date: 20090903
Implantable Lead Implant Date: 20090903
Implantable Lead Location: 753859
Implantable Lead Location: 753860
Implantable Pulse Generator Implant Date: 20200224
Lead Channel Impedance Value: 390 Ohm
Lead Channel Impedance Value: 510 Ohm
Lead Channel Pacing Threshold Amplitude: 0.75 V
Lead Channel Pacing Threshold Amplitude: 0.875 V
Lead Channel Pacing Threshold Pulse Width: 0.5 ms
Lead Channel Pacing Threshold Pulse Width: 0.5 ms
Lead Channel Sensing Intrinsic Amplitude: 1.4 mV
Lead Channel Sensing Intrinsic Amplitude: 12 mV
Lead Channel Setting Pacing Amplitude: 1.875
Lead Channel Setting Pacing Amplitude: 2.5 V
Lead Channel Setting Pacing Pulse Width: 0.5 ms
Lead Channel Setting Sensing Sensitivity: 4 mV
Pulse Gen Model: 2272
Pulse Gen Serial Number: 9107097

## 2021-09-23 NOTE — Progress Notes (Signed)
Remote pacemaker transmission.   

## 2021-12-06 ENCOUNTER — Other Ambulatory Visit: Payer: Self-pay

## 2021-12-06 ENCOUNTER — Encounter: Payer: Self-pay | Admitting: Internal Medicine

## 2021-12-06 ENCOUNTER — Ambulatory Visit (INDEPENDENT_AMBULATORY_CARE_PROVIDER_SITE_OTHER): Payer: Medicare PPO | Admitting: Internal Medicine

## 2021-12-06 VITALS — BP 126/68 | HR 70 | Wt 260.8 lb

## 2021-12-06 DIAGNOSIS — I495 Sick sinus syndrome: Secondary | ICD-10-CM | POA: Diagnosis not present

## 2021-12-06 LAB — CUP PACEART INCLINIC DEVICE CHECK
Battery Remaining Longevity: 70 mo
Battery Voltage: 2.98 V
Brady Statistic RA Percent Paced: 0 %
Brady Statistic RV Percent Paced: 96 %
Date Time Interrogation Session: 20230321151329
Implantable Lead Implant Date: 20090903
Implantable Lead Implant Date: 20090903
Implantable Lead Location: 753859
Implantable Lead Location: 753860
Implantable Pulse Generator Implant Date: 20200224
Lead Channel Impedance Value: 387.5 Ohm
Lead Channel Impedance Value: 512.5 Ohm
Lead Channel Pacing Threshold Amplitude: 0.75 V
Lead Channel Pacing Threshold Amplitude: 0.75 V
Lead Channel Pacing Threshold Amplitude: 0.875 V
Lead Channel Pacing Threshold Pulse Width: 0.5 ms
Lead Channel Pacing Threshold Pulse Width: 0.5 ms
Lead Channel Pacing Threshold Pulse Width: 0.5 ms
Lead Channel Sensing Intrinsic Amplitude: 1.9 mV
Lead Channel Sensing Intrinsic Amplitude: 11.5 mV
Lead Channel Setting Pacing Amplitude: 2.5 V
Lead Channel Setting Pacing Pulse Width: 0.5 ms
Lead Channel Setting Sensing Sensitivity: 4 mV
Pulse Gen Model: 2272
Pulse Gen Serial Number: 9107097

## 2021-12-06 NOTE — Progress Notes (Signed)
Ekg

## 2021-12-06 NOTE — Progress Notes (Signed)
? ? ? ? ?HPI ?[name] returns today for followup. He is a pleasant 81 yo man with  Chronic diastolic heart failure, persistent atrial fib, CHB, s/p PPM insertion. He has had trouble with weight gain. No syncope. He has minimal edema. He admits to being increasingly sedentary. "I am getting lazy doc." He denies chest pain. His activity is limited by arthritis which involves multiple joints. ?Allergies  ?Allergen Reactions  ? Bee Venom Anaphylaxis and Swelling  ? Shellfish Allergy Hives  ?  Accelerated allergic reaction; questionable laryngospasm or laryngeal edema with some difficulty breathing prompting ED evaluation and treatment at [facility]  ? Penicillins Rash  ?  Has patient had a PCN reaction causing immediate rash, facial/tongue/throat swelling, SOB or lightheadedness with hypotension: No ?Has patient had a PCN reaction causing severe rash involving mucus membranes or skin necrosis: No ?Has patient had a PCN reaction that required hospitalization No ?Has patient had a PCN reaction occurring within the last 10 years: No ?If all of the above answers are "NO", then may proceed with Cephalosporin use. ?  ? ? ? ?Current Outpatient Medications  ?Medication Sig Dispense Refill  ? acetaminophen (TYLENOL) 500 MG tablet Take 1,000 mg by mouth 2 (two) times daily.    ? antiseptic oral rinse (BIOTENE) LIQD 15 mLs by Mouth Rinse route as needed for dry mouth.    ? apixaban (ELIQUIS) 5 MG TABS tablet Take 1 tablet (5 mg total) by mouth 2 (two) times daily. 60 tablet 1  ? diltiazem (TIAZAC) 120 MG 24 hr capsule Take 120 mg by mouth every evening.     ? diltiazem (TIAZAC) 300 MG 24 hr capsule Take 300 mg by mouth daily.     ? furosemide (LASIX) 40 MG tablet Take 1 tablet (40 mg total) by mouth 2 (two) times daily. (Patient taking differently: Take 40-80 mg by mouth 2 (two) times daily. Take 2 tablets (80 mg) by mouth in the morning & take 1 tablet (40 mg) by mouth in the evening.) 60 tablet 0  ? gabapentin (NEURONTIN)  300 MG capsule Take 1,200 mg by mouth 2 (two) times daily.     ? hydrocortisone (ANUSOL-HC) 2.5 % rectal cream Place 1 application rectally as needed for hemorrhoids (after each bowel movement).     ? ketoconazole (NIZORAL) 2 % cream Apply 1 application topically daily as needed (skin irritation).     ? lidocaine (LIDODERM) 5 % Place 1 patch onto the skin daily as needed (back pain.). Remove & Discard patch within 12 hours or as directed by MD    ? lidocaine (XYLOCAINE) 5 % ointment Apply 1 application topically 3 (three) times daily as needed (back pain.).    ? metoprolol tartrate (LOPRESSOR) 25 MG tablet Take 0.5 tablets (12.5 mg total) by mouth 2 (two) times daily. 90 tablet 3  ? pantoprazole (PROTONIX) 40 MG tablet Take 40 mg by mouth daily.    ? predniSONE (DELTASONE) 20 MG tablet Take 25 mg by mouth daily with breakfast.    ? rosuvastatin (CRESTOR) 10 MG tablet Take 10 mg by mouth at bedtime.    ? Tamsulosin HCl (FLOMAX) 0.4 MG CAPS Take 0.4 mg by mouth daily.    ? traMADol (ULTRAM) 50 MG tablet Take by mouth every 6 (six) hours as needed.    ? zinc oxide (BALMEX) 11.3 % CREA cream Apply 1 application topically 2 (two) times daily as needed (skin breakdown).     ? HYDROcodone-acetaminophen (NORCO/VICODIN) 5-325 MG tablet  Take 1 tablet by mouth 4 (four) times daily as needed. (Patient not taking: Reported on 12/06/2021)    ? ?No current facility-administered medications for this visit.  ? ? ? ?Past Medical History:  ?Diagnosis Date  ? Atrial flutter ([place])   ? a. s/p ablation 01/2015.  ? BPH (benign prostatic hyperplasia)   ? Cerebrovascular disease   ? carotid bruit; no focal stenosis by carotid ultrasound in 05/2008  ? Congestive heart failure (CHF) ([place])   ? Dysrhythmia   ? General weakness   ? +malaise, exercise intolerance, and near syncope  ? Hypertension   ? Low back pain   ? history of traumax2  ? Paroxysmal atrial fibrillation (HCC)   ? on Eliquis  ? Presence of permanent cardiac pacemaker 2009  ? Sleep  apnea   ? c pap at night  ? Tachy-brady syndrome ([place])   ? a. s/p St. Jude pacemaker 2009.  ? ? ?ROS: ? ? All systems reviewed and negative except as noted in the HPI. ? ? ?Past Surgical History:  ?Procedure Laterality Date  ? ABLATION    ? ARTERY BIOPSY Right 07/07/2019  ? Procedure: BIOPSY TEMPORAL ARTERY;  Surgeon: Aviva Signs, MD;  Location[facility];  Service: General;  Laterality: Right;  ? CATARACT EXTRACTION    ? Bilateral  ? CERVICAL DISCECTOMY  2006  ? COLONOSCOPY WITH PROPOFOL N/A 01/26/2017  ? Procedure: COLONOSCOPY WITH PROPOFOL;  Surgeon: [name], MD;  [facility];  Service: Endoscopy;  Laterality: N/A;  8:30  ? ELECTROPHYSIOLOGIC STUDY N/A 01/18/2015  ? Procedure: A-Flutter/A-Tach/Svt Ablation;  Surgeon: [name], MD;  Location: [place] [facility];  Service: Cardiovascular;  Laterality: N/A;  ? INSERT / REPLACE / REMOVE PACEMAKER    ? KNEE ARTHROSCOPY WITH MEDIAL MENISECTOMY Left 06/03/2015  ? Procedure: KNEE ARTHROSCOPY WITH MEDIAL MENISECTOMY;  Surgeon: [name], MD;  Location: [facility];  Service: Orthopedics;  Laterality: Left;  ? PACEMAKER INSERTION  1966  ? POLYPECTOMY  01/26/2017  ? Procedure: POLYPECTOMY;  Surgeon: [name], MD;  [facility];  Service: Endoscopy;;  colon  ? PPM GENERATOR CHANGEOUT N/A 11/11/2018  ? Procedure: PPM GENERATOR CHANGEOUT;  Surgeon: [name], MD;  Location: [place] [facility];  Service: Cardiovascular;  Laterality: N/A;  ? ? ? ?Family History  ?Problem Relation Age of Onset  ? Hypertension Other   ? Neuropathy Neg Hx   ? Stroke Neg Hx   ? ? ? ?Social History  ? ?Socioeconomic History  ? Marital status: Divorced  ?  Spouse name: Not on file  ? Number of children: 3  ? Years of education: 12+  ? Highest education level: Not on file  ?Occupational History  ? Occupation: Retired  ?Tobacco Use  ? Smoking status: Former  ?  Packs/day: 1.00  ?  Years: 0.50  ?  Pack years: 0.50  ?  Types: Cigarettes  ?  Quit date:  03/27/1962  ?  Years since quitting: 59.7  ? Smokeless tobacco: Never  ?Vaping Use  ? Vaping Use: Never used  ?Substance and Sexual Activity  ? Alcohol use: No  ?  Alcohol/week: 0.0 standard drinks  ? Drug use: No  ? Sexual activity: Not Currently  ?Other Topics Concern  ? Not on file  ?Social History Narrative  ? Divorced with 3 adult children. Semi retired.   ? Caffeine use: seldom   ? Lives alone  ? ?Social Determinants of  Health  ? ?Financial Resource Strain: Not on file  ?Food Insecurity: Not on file  ?Transportation Needs: Not on file  ?Physical Activity: Not on file  ?Stress: Not on file  ?Social Connections: Not on file  ?Intimate Partner Violence: Not on file  ? ? ? ?BP 126/68   Pulse 70   Wt 260 lb 12.8 oz (118.3 kg)   SpO2 95%   BMI 39.65 kg/m?  ? ?Physical Exam: ? ?Well appearing NAD ?HEENT: Unremarkable ?Neck:  No JVD, no thyromegally ?Lymphatics:  No adenopathy ?Back:  No CVA tenderness ?Lungs:  Clear ?HEART:  Regular rate rhythm, no murmurs, no rubs, no clicks ?Abd:  soft, positive bowel sounds, no organomegally, no rebound, no guarding ?Ext:  2 plus pulses, no edema, no cyanosis, no clubbing ?Skin:  No rashes no nodules ?Neuro:  CN II through XII intact, motor grossly intact ? ?EKG - atrial fib with ventricular pacing ? ?DEVICE  ?Normal device function.  See PaceArt for details.  ? ?Assess/Plan:  ?Persistent atrial fib - his VR is well controlled. He does not have palpitations and does not feel that he is in atrial fib. ?Obesity - I encouraged him to lose weight. He struggles to exercise due to the obesity. ?PPM - his St. Jude DDD PM has been reprogrammed to VVIR 70 as he has been chronically out of rhythm for the last year. ?HTN - his bp is well controlled. We will follow. ? ?Carleene Overlie Shamya Macfadden,MD ?

## 2021-12-06 NOTE — Patient Instructions (Signed)
Medication Instructions:  Your physician recommends that you continue on your current medications as directed. Please refer to the Current Medication list given to you today.  *If you need a refill on your cardiac medications before your next appointment, please call your pharmacy*   Lab Work: NONE   If you have labs (blood work) drawn today and your tests are completely normal, you will receive your results only by: . MyChart Message (if you have MyChart) OR . A paper copy in the mail If you have any lab test that is abnormal or we need to change your treatment, we will call you to review the results.   Testing/Procedures: NONE    Follow-Up: At [facility], you and your health needs are our priority.  As part of our continuing mission to provide you with exceptional heart care, we have created designated Provider Care Teams.  These Care Teams include your primary Cardiologist (physician) and Advanced Practice Providers (APPs -  Physician Assistants and Nurse Practitioners) who all work together to provide you with the care you need, when you need it.  We recommend signing up for the patient portal called "MyChart".  Sign up information is provided on this After Visit Summary.  MyChart is used to connect with patients for Virtual Visits (Telemedicine).  Patients are able to view lab/test results, encounter notes, upcoming appointments, etc.  Non-urgent messages can be sent to your provider as well.   To learn more about what you can do with MyChart, go to [email or web address].    Your next appointment:   1 year(s)  The format for your next appointment:   In Person  Provider:   Gregg Taylor, MD   Other Instructions Thank you for choosing [facility]!    

## 2021-12-14 ENCOUNTER — Ambulatory Visit (INDEPENDENT_AMBULATORY_CARE_PROVIDER_SITE_OTHER): Payer: Medicare PPO

## 2021-12-14 DIAGNOSIS — I495 Sick sinus syndrome: Secondary | ICD-10-CM | POA: Diagnosis not present

## 2021-12-14 LAB — CUP PACEART REMOTE DEVICE CHECK
Battery Remaining Longevity: 73 mo
Battery Remaining Percentage: 65 %
Battery Voltage: 2.99 V
Brady Statistic RV Percent Paced: 98 %
Date Time Interrogation Session: 20230329020013
Implantable Lead Implant Date: 20090903
Implantable Lead Implant Date: 20090903
Implantable Lead Location: 753859
Implantable Lead Location: 753860
Implantable Pulse Generator Implant Date: 20200224
Lead Channel Impedance Value: 510 Ohm
Lead Channel Pacing Threshold Amplitude: 0.75 V
Lead Channel Pacing Threshold Pulse Width: 0.5 ms
Lead Channel Sensing Intrinsic Amplitude: 11.8 mV
Lead Channel Setting Pacing Amplitude: 2.5 V
Lead Channel Setting Pacing Pulse Width: 0.5 ms
Lead Channel Setting Sensing Sensitivity: 4 mV
Pulse Gen Model: 2272
Pulse Gen Serial Number: 9107097

## 2021-12-27 NOTE — Progress Notes (Signed)
Remote pacemaker transmission.   

## 2022-01-03 ENCOUNTER — Encounter (INDEPENDENT_AMBULATORY_CARE_PROVIDER_SITE_OTHER): Payer: Self-pay | Admitting: *Deleted

## 2022-01-23 DIAGNOSIS — I5032 Chronic diastolic (congestive) heart failure: Secondary | ICD-10-CM | POA: Diagnosis not present

## 2022-01-23 DIAGNOSIS — I10 Essential (primary) hypertension: Secondary | ICD-10-CM | POA: Diagnosis not present

## 2022-01-23 DIAGNOSIS — M353 Polymyalgia rheumatica: Secondary | ICD-10-CM | POA: Diagnosis not present

## 2022-01-23 DIAGNOSIS — Z79899 Other long term (current) drug therapy: Secondary | ICD-10-CM | POA: Diagnosis not present

## 2022-01-30 DIAGNOSIS — I480 Paroxysmal atrial fibrillation: Secondary | ICD-10-CM | POA: Diagnosis not present

## 2022-01-30 DIAGNOSIS — I5030 Unspecified diastolic (congestive) heart failure: Secondary | ICD-10-CM | POA: Diagnosis not present

## 2022-03-15 ENCOUNTER — Ambulatory Visit (INDEPENDENT_AMBULATORY_CARE_PROVIDER_SITE_OTHER): Payer: Medicare PPO

## 2022-03-15 DIAGNOSIS — I495 Sick sinus syndrome: Secondary | ICD-10-CM | POA: Diagnosis not present

## 2022-03-15 LAB — CUP PACEART REMOTE DEVICE CHECK
Battery Remaining Longevity: 70 mo
Battery Remaining Percentage: 63 %
Battery Voltage: 2.99 V
Brady Statistic RV Percent Paced: 98 %
Date Time Interrogation Session: 20230628020027
Implantable Lead Implant Date: 20090903
Implantable Lead Implant Date: 20090903
Implantable Lead Location: 753859
Implantable Lead Location: 753860
Implantable Pulse Generator Implant Date: 20200224
Lead Channel Impedance Value: 490 Ohm
Lead Channel Pacing Threshold Amplitude: 0.75 V
Lead Channel Pacing Threshold Pulse Width: 0.5 ms
Lead Channel Sensing Intrinsic Amplitude: 12 mV
Lead Channel Setting Pacing Amplitude: 2.5 V
Lead Channel Setting Pacing Pulse Width: 0.5 ms
Lead Channel Setting Sensing Sensitivity: 4 mV
Pulse Gen Model: 2272
Pulse Gen Serial Number: 9107097

## 2022-04-03 NOTE — Progress Notes (Signed)
Remote pacemaker transmission.   

## 2022-04-17 DIAGNOSIS — K219 Gastro-esophageal reflux disease without esophagitis: Secondary | ICD-10-CM | POA: Diagnosis not present

## 2022-04-17 DIAGNOSIS — I10 Essential (primary) hypertension: Secondary | ICD-10-CM | POA: Diagnosis not present

## 2022-04-17 DIAGNOSIS — I4891 Unspecified atrial fibrillation: Secondary | ICD-10-CM | POA: Diagnosis not present

## 2022-04-25 DIAGNOSIS — Z79899 Other long term (current) drug therapy: Secondary | ICD-10-CM | POA: Diagnosis not present

## 2022-04-25 DIAGNOSIS — I4819 Other persistent atrial fibrillation: Secondary | ICD-10-CM | POA: Diagnosis not present

## 2022-04-25 DIAGNOSIS — I5032 Chronic diastolic (congestive) heart failure: Secondary | ICD-10-CM | POA: Diagnosis not present

## 2022-05-02 DIAGNOSIS — I5032 Chronic diastolic (congestive) heart failure: Secondary | ICD-10-CM | POA: Diagnosis not present

## 2022-05-02 DIAGNOSIS — I10 Essential (primary) hypertension: Secondary | ICD-10-CM | POA: Diagnosis not present

## 2022-05-02 DIAGNOSIS — I480 Paroxysmal atrial fibrillation: Secondary | ICD-10-CM | POA: Diagnosis not present

## 2022-05-27 DIAGNOSIS — H9192 Unspecified hearing loss, left ear: Secondary | ICD-10-CM | POA: Diagnosis not present

## 2022-06-01 DIAGNOSIS — Z23 Encounter for immunization: Secondary | ICD-10-CM | POA: Diagnosis not present

## 2022-06-01 DIAGNOSIS — H9201 Otalgia, right ear: Secondary | ICD-10-CM | POA: Diagnosis not present

## 2022-06-14 ENCOUNTER — Ambulatory Visit (INDEPENDENT_AMBULATORY_CARE_PROVIDER_SITE_OTHER): Payer: Medicare PPO

## 2022-06-14 DIAGNOSIS — I495 Sick sinus syndrome: Secondary | ICD-10-CM | POA: Diagnosis not present

## 2022-06-14 LAB — CUP PACEART REMOTE DEVICE CHECK
Battery Remaining Longevity: 67 mo
Battery Remaining Percentage: 61 %
Battery Voltage: 2.99 V
Brady Statistic RV Percent Paced: 98 %
Date Time Interrogation Session: 20230927020015
Implantable Lead Implant Date: 20090903
Implantable Lead Implant Date: 20090903
Implantable Lead Location: 753859
Implantable Lead Location: 753860
Implantable Pulse Generator Implant Date: 20200224
Lead Channel Impedance Value: 460 Ohm
Lead Channel Pacing Threshold Amplitude: 0.75 V
Lead Channel Pacing Threshold Pulse Width: 0.5 ms
Lead Channel Sensing Intrinsic Amplitude: 11.7 mV
Lead Channel Setting Pacing Amplitude: 2.5 V
Lead Channel Setting Pacing Pulse Width: 0.5 ms
Lead Channel Setting Sensing Sensitivity: 4 mV
Pulse Gen Model: 2272
Pulse Gen Serial Number: 9107097

## 2022-06-21 NOTE — Progress Notes (Signed)
Remote pacemaker transmission.   

## 2022-07-28 DIAGNOSIS — I4819 Other persistent atrial fibrillation: Secondary | ICD-10-CM | POA: Diagnosis not present

## 2022-07-28 DIAGNOSIS — I10 Essential (primary) hypertension: Secondary | ICD-10-CM | POA: Diagnosis not present

## 2022-07-28 DIAGNOSIS — Z79899 Other long term (current) drug therapy: Secondary | ICD-10-CM | POA: Diagnosis not present

## 2022-07-28 DIAGNOSIS — I5032 Chronic diastolic (congestive) heart failure: Secondary | ICD-10-CM | POA: Diagnosis not present

## 2022-08-03 DIAGNOSIS — I10 Essential (primary) hypertension: Secondary | ICD-10-CM | POA: Diagnosis not present

## 2022-08-03 DIAGNOSIS — R7309 Other abnormal glucose: Secondary | ICD-10-CM | POA: Diagnosis not present

## 2022-08-03 DIAGNOSIS — E785 Hyperlipidemia, unspecified: Secondary | ICD-10-CM | POA: Diagnosis not present

## 2022-08-03 DIAGNOSIS — D6869 Other thrombophilia: Secondary | ICD-10-CM | POA: Diagnosis not present

## 2022-08-03 DIAGNOSIS — I5032 Chronic diastolic (congestive) heart failure: Secondary | ICD-10-CM | POA: Diagnosis not present

## 2022-08-23 ENCOUNTER — Ambulatory Visit: Payer: Medicare PPO | Admitting: Pain Medicine

## 2022-08-30 ENCOUNTER — Ambulatory Visit: Payer: Medicare PPO

## 2022-09-13 ENCOUNTER — Ambulatory Visit (INDEPENDENT_AMBULATORY_CARE_PROVIDER_SITE_OTHER): Payer: Medicare PPO

## 2022-09-13 DIAGNOSIS — I495 Sick sinus syndrome: Secondary | ICD-10-CM | POA: Diagnosis not present

## 2022-09-14 LAB — CUP PACEART REMOTE DEVICE CHECK
Battery Remaining Longevity: 65 mo
Battery Remaining Percentage: 59 %
Battery Voltage: 2.99 V
Brady Statistic RV Percent Paced: 99 %
Date Time Interrogation Session: 20231227020019
Implantable Lead Connection Status: 753985
Implantable Lead Connection Status: 753985
Implantable Lead Implant Date: 20090903
Implantable Lead Implant Date: 20090903
Implantable Lead Location: 753859
Implantable Lead Location: 753860
Implantable Pulse Generator Implant Date: 20200224
Lead Channel Impedance Value: 510 Ohm
Lead Channel Pacing Threshold Amplitude: 0.75 V
Lead Channel Pacing Threshold Pulse Width: 0.5 ms
Lead Channel Sensing Intrinsic Amplitude: 12 mV
Lead Channel Setting Pacing Amplitude: 2.5 V
Lead Channel Setting Pacing Pulse Width: 0.5 ms
Lead Channel Setting Sensing Sensitivity: 4 mV
Pulse Gen Model: 2272
Pulse Gen Serial Number: 9107097

## 2022-10-02 DIAGNOSIS — M4310 Spondylolisthesis, site unspecified: Secondary | ICD-10-CM | POA: Insufficient documentation

## 2022-10-02 DIAGNOSIS — M5030 Other cervical disc degeneration, unspecified cervical region: Secondary | ICD-10-CM | POA: Insufficient documentation

## 2022-10-02 DIAGNOSIS — Z79899 Other long term (current) drug therapy: Secondary | ICD-10-CM | POA: Insufficient documentation

## 2022-10-02 DIAGNOSIS — Z789 Other specified health status: Secondary | ICD-10-CM | POA: Insufficient documentation

## 2022-10-02 DIAGNOSIS — M899 Disorder of bone, unspecified: Secondary | ICD-10-CM | POA: Insufficient documentation

## 2022-10-02 DIAGNOSIS — M4317 Spondylolisthesis, lumbosacral region: Secondary | ICD-10-CM | POA: Insufficient documentation

## 2022-10-02 NOTE — Progress Notes (Signed)
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 Value Date   LYMEIGGIGMAB <0.91 02/22/2016     Coagulation Lab Results  Component Value Date   INR 1.17 11/18/2015   LABPROT 15.0 11/18/2015   PLT 189 11/04/2018     Cardiovascular Lab Results  Component Value Date   BNP 699.0 (H) 08/18/2017   CKTOTAL 129 04/08/2015   TROPONINI <0.03 08/18/2017   HGB 12.2 (L) 11/04/2018   HCT 38.4 (L) 11/04/2018     Screening Lab Results  Component Value Date   SARSCOV2NAA NEGATIVE 07/03/2019   STAPHAUREUS POSITIVE (A) 11/11/2018   MRSAPCR NEGATIVE 11/11/2018     Cancer No results found for: "CEA", "CA125", "LABCA2"   Allergens No results found for: "ALMOND", "APPLE", "ASPARAGUS", "AVOCADO", "BANANA", "BARLEY", "BASIL", "BAYLEAF", "GREENBEAN", "LIMABEAN", "WHITEBEAN", "BEEFIGE", "REDBEET", "BLUEBERRY", "BROCCOLI", "CABBAGE", "MELON", "CARROT", "CASEIN", "CASHEWNUT", "CAULIFLOWER", "CELERY"     Note: Lab results  reviewed.  Craig  Drug: Mr. Bickford  reports no history of drug use. Alcohol:  reports no history of alcohol use. Tobacco:  reports that he quit smoking about 60 years ago. His smoking use included cigarettes. He has a 0.50 pack-year smoking history. He has never used smokeless tobacco. Medical:  has a past medical history of Atrial flutter ([place]), BPH (benign prostatic hyperplasia), Cerebrovascular disease, Congestive heart failure (CHF) ([place]), Dysrhythmia, General weakness, Hypertension, Low back pain, Paroxysmal atrial fibrillation ([place]), Presence of permanent cardiac pacemaker (2009), Sleep apnea, and Tachy-brady syndrome ([place]). Family: family history includes Hypertension in  an other family member.  Past Surgical History:  Procedure Laterality Date   ABLATION     ARTERY BIOPSY Right 07/07/2019   Procedure: BIOPSY TEMPORAL ARTERY;  Surgeon: Aviva Signs, MD;  Location: [facility];  Service: General;  Laterality: Right;   CATARACT EXTRACTION     Bilateral   CERVICAL DISCECTOMY  2006   COLONOSCOPY WITH PROPOFOL N/A 01/26/2017   Procedure: COLONOSCOPY WITH PROPOFOL;  Surgeon: [name], MD;  Location: [facility];  Service: Endoscopy;  Laterality: N/A;  8:30   ELECTROPHYSIOLOGIC STUDY N/A 01/18/2015   Procedure: A-Flutter/A-Tach/Svt Ablation;  Surgeon: [name], MD;  Location: [facility];  Service: Cardiovascular;  Laterality: N/A;   INSERT / REPLACE / REMOVE PACEMAKER     KNEE ARTHROSCOPY WITH MEDIAL MENISECTOMY Left 06/03/2015   Procedure: KNEE ARTHROSCOPY WITH MEDIAL MENISECTOMY;  Surgeon: [name], MD;  Location: [facility];  Service: Orthopedics;  Laterality: Left;   PACEMAKER INSERTION  1966   POLYPECTOMY  01/26/2017   Procedure: POLYPECTOMY;  Surgeon: [name], MD;  Location: [facility];  Service: Endoscopy;;  colon   PPM GENERATOR CHANGEOUT N/A 11/11/2018   Procedure: PPM GENERATOR CHANGEOUT;  Surgeon: [name], MD;  Location: [facility];  Service: Cardiovascular;  Laterality: N/A;   Active Ambulatory Problems    Diagnosis Date Noted   Hyperlipidemia 02/17/2010   Chronic low back pain (1ry area of Pain) (Bilateral) (R>L) w/ sciatica (Bilateral) 07/31/2008   ORTHOSTATIC DIZZINESS 02/17/2010   Cerebrovascular disease 07/31/2008   Chest pain 02/17/2010   BENIGN PROSTATIC HYPERTROPHY, MILD, HX OF 07/31/2008   PPM-[place] 06/16/2009   Sick sinus syndrome ([place]) 06/13/2011   Hypertension 11/21/2011   Atrial flutter ([place]) 11/28/2012   OSA (obstructive sleep apnea) 09/02/2013   Dyspnea 12/25/2014   Atrial fibrillation ([place]) 03/15/2015   Fatigue 11/18/2015   Chronic anticoagulation (Eliquis) 11/18/2015   Paroxysmal atrial fibrillation (HCC)    Hx of colonic polyps 12/28/2016   Chronic diastolic CHF (congestive heart failure) ([place]) 06/06/2017   Lumbar radiculopathy 07/24/2017   Pars defect of lumbar spine (Bilateral L5) 07/24/2017   DDD (degenerative disc disease), lumbar 07/24/2017   Acute on chronic diastolic CHF (congestive heart failure) ([place]) 08/08/2017   Hyperglycemia 08/08/2017   CKD (chronic kidney disease), stage III ([place]) 08/08/2017   Acute respiratory failure with hypoxia (HCC) 08/18/2017   Chronic pain syndrome 08/18/2017   Temporal arteritis (HCC)    Pharmacologic therapy 10/02/2022   Disorder of skeletal system 10/02/2022   Problems influencing health status 10/02/2022   DDD (degenerative disc disease), cervical 10/02/2022   Anterolisthesis (55m) of lumbosacral spine (L5/S1) 10/02/2022   Pars defect (Bilateral L5) w/ spondylolisthesis (L5/S1) (9 mm) 10/02/2022   Chronic neck pain (2ry area of Pain) (Bilateral) (R>L) 10/04/2022   Painful cervical range of motion 10/04/2022   Impaired range of motion of cervical spine 10/04/2022   Decreased range of motion of lumbar spine 10/04/2022   Chronic neck pain with history of cervical spinal surgery 10/04/2022   History of cervical spinal surgery 10/04/2022    Chronic lower extremity pain (3ry area of Pain) (Bilateral) (R>L) 10/04/2022   Chronic upper extremity pain (4th area of Pain) (Bilateral) (R>L) 10/04/2022   Resolved Ambulatory Problems    Diagnosis Date Noted   No Resolved Ambulatory Problems   Past Medical History:  Diagnosis Date   BPH (benign prostatic hyperplasia)    Congestive heart failure (CHF) ([place]  Dysrhythmia    General weakness    Low back pain    Presence of permanent cardiac pacemaker 2009   Sleep apnea    Tachy-brady syndrome ([place])    Constitutional Exam  General appearance: Well nourished, well developed, and well hydrated. In no apparent acute distress Vitals:   10/04/22 1422  BP: 134/62  Pulse: 73  Temp: 98.1 F (36.7 C)  SpO2: 97%  Weight: 244 lb (110.7 kg)  Height: '5\' 8"'$  (1.727 m)   BMI Assessment: Estimated body mass index is 37.1 kg/m as calculated from the following:   Height as of this encounter: '5\' 8"'$  (1.727 m).   Weight as of this encounter: 244 lb (110.7 kg).  BMI interpretation table: BMI level Category Range association with higher incidence of chronic pain  <18 kg/m2 Underweight   18.5-24.9 kg/m2 Ideal body weight   25-29.9 kg/m2 Overweight Increased incidence by 20%  30-34.9 kg/m2 Obese (Class I) Increased incidence by 68%  35-39.9 kg/m2 Severe obesity (Class II) Increased incidence by 136%  >40 kg/m2 Extreme obesity (Class III) Increased incidence by 254%   Patient's current BMI Ideal Body weight  Body mass index is 37.1 kg/m. Ideal body weight: 68.4 kg (150 lb 12.7 oz) Adjusted ideal body weight: 85.3 kg (188 lb 1.2 oz)   BMI Readings from Last 4 Encounters:  10/04/22 37.10 kg/m  12/06/21 39.65 kg/m  02/10/21 37.25 kg/m  01/07/21 37.25 kg/m   Wt Readings from Last 4 Encounters:  10/04/22 244 lb (110.7 kg)  12/06/21 260 lb 12.8 oz (118.3 kg)  02/10/21 245 lb (111.1 kg)  01/07/21 245 lb (111.1 kg)    Psych/Mental status: Alert, oriented x 3 (person, place, & time)        Eyes: PERLA Respiratory: No evidence of acute respiratory distress  Assessment  Primary Diagnosis & Pertinent Problem List: The primary encounter diagnosis was Chronic pain syndrome. Diagnoses of Anterolisthesis (53m) of lumbosacral spine (L5/S1), Pars defect (Bilateral L5) w/ spondylolisthesis (L5/S1) (9 mm), Pars defect of lumbar spine (Bilateral L5), DDD (degenerative disc disease), lumbar, Pharmacologic therapy, Disorder of skeletal system, Problems influencing health status, Chronic anticoagulation (Eliquis), Chronic low back pain (1ry area of Pain) (Bilateral) (R>L) w/ sciatica (Bilateral), Chronic neck pain (2ry area of Pain) (Bilateral) (R>L), Painful cervical range of motion, Impaired range of motion of cervical spine, Decreased range of motion of lumbar spine, Chronic neck pain with history of cervical spinal surgery, History of cervical spinal surgery, Chronic lower extremity pain (3ry area of Pain) (Bilateral) (R>L), Chronic upper extremity pain (4th area of Pain) (Bilateral) (R>L), and Cervicalgia were also pertinent to this visit.  Visit Diagnosis (New problems to examiner): 1. Chronic pain syndrome   2. Anterolisthesis (915m of lumbosacral spine (L5/S1)   3. Pars defect (Bilateral L5) w/ spondylolisthesis (L5/S1) (9 mm)   4. Pars defect of lumbar spine (Bilateral L5)   5. DDD (degenerative disc disease), lumbar   6. Pharmacologic therapy   7. Disorder of skeletal system   8. Problems influencing health status   9. Chronic anticoagulation (Eliquis)   10. Chronic low back pain (1ry area of Pain) (Bilateral) (R>L) w/ sciatica (Bilateral)   11. Chronic neck pain (2ry area of Pain) (Bilateral) (R>L)   12. Painful cervical range of motion   13. Impaired range of motion of cervical spine   14. Decreased range of motion of lumbar spine   15. Chronic neck pain with history of cervical spinal surgery   16. History of cervical  spinal surgery   17. Chronic lower extremity pain (3ry  area of Pain) (Bilateral) (R>L)   18. Chronic upper extremity pain (4th area of Pain) (Bilateral) (R>L)   19. Cervicalgia    Plan of Care (Initial workup plan)  Note: Mr. Godman was reminded that as per protocol, today's visit has been an evaluation only. We have not taken over the patient's controlled substance management.  Problem-specific plan: No problem-specific Assessment & Plan notes found for this encounter.  Lab Orders         Compliance Drug Analysis, Ur         Comp. Metabolic Panel (12)         Magnesium         Vitamin B12         Sedimentation rate         25-Hydroxy vitamin D Lcms D2+D3         C-reactive protein     Imaging Orders         DG Lumbar Spine Complete W/Bend         DG Cervical Spine With Flex & Extend         CT CERVICAL SPINE WO CONTRAST         CT LUMBAR SPINE WO CONTRAST     Referral Orders  No referral(s) requested today   Procedure Orders    No procedure(s) ordered today   Pharmacotherapy (current): Medications ordered:  No orders of the defined types were placed in this encounter.  Medications administered during this visit: Juliann Mule. Linwood Dibbles. had no medications administered during this visit.   Analgesic Pharmacotherapy:  Opioid Analgesics: For patients currently taking or requesting to take opioid analgesics, in accordance with [place], we will assess their risks and indications for the use of these substances. After completing our evaluation, we may offer recommendations, but we no longer take patients for medication management. The prescribing physician will ultimately decide, based on his/her training and level of comfort whether to adopt any of the recommendations, including whether or not to prescribe such medicines.  Membrane stabilizer: To be determined at a later time  Muscle relaxant: To be determined at a later time  NSAID: To be determined at a later time  Other analgesic(s): To be  determined at a later time   Interventional management options: Mr. Oleski was informed that there is no guarantee that he would be a candidate for interventional therapies. The decision will be based on the results of diagnostic studies, as well as Mr. Horger's risk profile.  Procedure(s) under consideration:  Pending results of ordered studies      Interventional Therapies  Risk Factors  Considerations:  ELIQUIS Anticoagulation: (Stop: 3 days  Restart: 6 hours)  OSA on CPAP CHF; A-fib; anticoagulation; sick sinus syndrome; cerebrovascular disease CKD   Planned  Pending:   See above for possible orders   Under consideration:   Pending completion of evaluation   Completed:   None at this time   Completed by other providers:   None at this time   Therapeutic  Palliative (PRN) options:   None established     Provider-requested follow-up: Return in about 3 weeks (around 10/25/2022) for (41mn), Eval-day (M,W), (F2F), 2nd Visit, for review of ordered tests.  Future Appointments  Date Time Provider [place] 10/09/2022  3:30 PM CArlana Lindau RD [facility] None  11/01/2022 11:00 AM NMilinda Pointer MD [facility] None  12/13/2022  7:10  AM [facility] DEVICE REMOTES [facility]  12/26/2022 11:30 AM [name], MD [facility]  03/14/2023  7:10 AM [facility] DEVICE REMOTES [facility]  06/13/2023  7:10 AM [facility] DEVICE REMOTES [facility]  12/12/2023  7:10 AM [facility] DEVICE REMOTES [facility]    Duration of encounter: 55 minutes.  Total time on encounter, as per AMA guidelines included both the face-to-face and non-face-to-face time personally spent by the physician and/or other qualified health care professional(s) on the day of the encounter (includes time in activities that require the physician or other qualified health care professional and does not include time in activities normally  performed by clinical staff). Physician's time may include the following activities when performed: Preparing to see the patient (e.g., pre-charting review of records, searching for previously ordered imaging, lab work, and nerve conduction tests) Review of prior analgesic pharmacotherapies. Reviewing PMP Interpreting ordered tests (e.g., lab work, imaging, nerve conduction tests) Performing post-procedure evaluations, including interpretation of diagnostic procedures Obtaining and/or reviewing separately obtained history Performing a medically appropriate examination and/or evaluation Counseling and educating the patient/family/caregiver Ordering medications, tests, or procedures Referring and communicating with other health care professionals (when not separately reported) Documenting clinical information in the electronic or other health record Independently interpreting results (not separately reported) and communicating results to the patient/ family/caregiver Care coordination (not separately reported)  Note by: Gregory Cola, MD Date: 10/04/2022; Time: 4:13 PM

## 2022-10-02 NOTE — Patient Instructions (Addendum)
______________________________________________________________________  P ____________________________________________________________________________  New Patients  Welcome to [facility].   Initial Visit The first or initial visit consists of an evaluation only.   Interventional pain management.  We offer therapies other than opioid controlled substances to manage chronic pain. These include, but are not limited to, diagnostic, therapeutic, and palliative specialized injection therapies (i.e.: Epidural Steroids, Facet Blocks, etc.). We specialize in a variety of nerve blocks as well as radiofrequency treatments. We offer pain implant evaluations and trials, as well as follow up management. In addition we also provide a variety joint injections, including Viscosupplementation (AKA: Gel Therapy).  Prescription Pain Medication We provide evaluations for/of pharmacologic therapies. Recommendations will follow CDC Guidelines.  We no longer take patients for long-term medication management. We will not be taking over your pain medications.  ____________________________________________________________________________________________    ____________________________________________________________________________________________  Patient Information update  To: All of our patients.  Re: Name change.  It has been made official that our current name, "[facility]"   will soon be changed to "[facility]".   The purpose of this change is to eliminate any confusion created by the concept of our practice being a "Medication Management Pain Clinic". In the past this has led to the misconception that we treat pain primarily by the use of prescription medications.  Nothing can be farther from the truth.   Understanding PAIN  MANAGEMENT: To further understand what our practice does, you first have to understand that "Pain Management" is a subspecialty that requires additional training once a physician has completed their specialty training, which can be in either Anesthesia, Neurology, Psychiatry, or Physical Medicine and Rehabilitation (PMR). Each one of these contributes to the final approach taken by each physician to the management of their patient's pain. To be a "Pain Management Specialist" you must have first completed one of the specialty trainings below.  Anesthesiologists - trained in clinical pharmacology and interventional techniques such as nerve blockade and regional as well as central neuroanatomy. They are trained to block pain before, during, and after surgical interventions.  Neurologists - trained in the diagnosis and pharmacological treatment of complex neurological conditions, such as Multiple Sclerosis, Parkinson's, spinal cord injuries, and other systemic conditions that may be associated with symptoms that may include but are not limited to pain. They tend to rely primarily on the treatment of chronic pain using prescription medications.  Psychiatrist - trained in conditions affecting the psychosocial wellbeing of patients including but not limited to depression, anxiety, schizophrenia, personality disorders, addiction, and other substance use disorders that may be associated with chronic pain. They tend to rely primarily on the treatment of chronic pain using prescription medications.   Physical Medicine and Rehabilitation (PMR) physicians, also known as physiatrists - trained to treat a wide variety of medical conditions affecting the brain, spinal cord, nerves, bones, joints, ligaments, muscles, and tendons. Their training is primarily aimed at treating patients that have suffered injuries that have caused severe physical impairment. Their training is primarily aimed at the physical therapy and  rehabilitation of those patients. They may also work alongside orthopedic surgeons or neurosurgeons using their expertise in assisting surgical patients to recover after their surgeries.  INTERVENTIONAL PAIN MANAGEMENT is sub-subspecialty of Pain Management.  Our physicians are Board-certified in Anesthesia, Pain Management, and Interventional Pain Management.  This meaning that not only have they been trained and Board-certified in their specialty  of Anesthesia, and subspecialty of Pain Management, but they have also received further training in the sub-subspecialty of Interventional Pain Management, in order to become Board-certified as INTERVENTIONAL PAIN MANAGEMENT SPECIALIST.    Mission: Our goal is to use our skills in  [place] as alternatives to the chronic use of prescription opioid medications for the treatment of pain. To make this more clear, we have changed our name to reflect what we do and offer. We will continue to offer medication management assessment and recommendations, but we will not be taking over any patient's medication management.  ____________________________________________________________________________________________

## 2022-10-03 NOTE — Progress Notes (Signed)
Remote pacemaker transmission.   

## 2022-10-04 ENCOUNTER — Encounter: Payer: Self-pay | Admitting: Pain Medicine

## 2022-10-04 ENCOUNTER — Ambulatory Visit
Admission: RE | Admit: 2022-10-04 | Discharge: 2022-10-04 | Disposition: A | Discharge status: Home / Self Care | Payer: Medicare PPO | Source: Ambulatory Visit | Attending: Pain Medicine | Admitting: Pain Medicine

## 2022-10-04 ENCOUNTER — Ambulatory Visit: Payer: No Typology Code available for payment source | Attending: Pain Medicine | Admitting: Pain Medicine

## 2022-10-04 VITALS — BP 134/62 | HR 73 | Temp 98.1°F | Ht 68.0 in | Wt 244.0 lb

## 2022-10-04 DIAGNOSIS — G8928 Other chronic postprocedural pain: Secondary | ICD-10-CM

## 2022-10-04 DIAGNOSIS — M47816 Spondylosis without myelopathy or radiculopathy, lumbar region: Secondary | ICD-10-CM | POA: Diagnosis not present

## 2022-10-04 DIAGNOSIS — M5441 Lumbago with sciatica, right side: Secondary | ICD-10-CM

## 2022-10-04 DIAGNOSIS — M5136 Other intervertebral disc degeneration, lumbar region: Secondary | ICD-10-CM | POA: Insufficient documentation

## 2022-10-04 DIAGNOSIS — M4306 Spondylolysis, lumbar region: Secondary | ICD-10-CM

## 2022-10-04 DIAGNOSIS — M4317 Spondylolisthesis, lumbosacral region: Secondary | ICD-10-CM

## 2022-10-04 DIAGNOSIS — Z789 Other specified health status: Secondary | ICD-10-CM

## 2022-10-04 DIAGNOSIS — M5382 Other specified dorsopathies, cervical region: Secondary | ICD-10-CM | POA: Diagnosis not present

## 2022-10-04 DIAGNOSIS — M79605 Pain in left leg: Secondary | ICD-10-CM | POA: Diagnosis not present

## 2022-10-04 DIAGNOSIS — G8929 Other chronic pain: Secondary | ICD-10-CM | POA: Insufficient documentation

## 2022-10-04 DIAGNOSIS — M79601 Pain in right arm: Secondary | ICD-10-CM | POA: Insufficient documentation

## 2022-10-04 DIAGNOSIS — M4322 Fusion of spine, cervical region: Secondary | ICD-10-CM | POA: Diagnosis not present

## 2022-10-04 DIAGNOSIS — Z7901 Long term (current) use of anticoagulants: Secondary | ICD-10-CM

## 2022-10-04 DIAGNOSIS — M4310 Spondylolisthesis, site unspecified: Secondary | ICD-10-CM | POA: Diagnosis not present

## 2022-10-04 DIAGNOSIS — Z9889 Other specified postprocedural states: Secondary | ICD-10-CM | POA: Diagnosis not present

## 2022-10-04 DIAGNOSIS — M79602 Pain in left arm: Secondary | ICD-10-CM | POA: Diagnosis not present

## 2022-10-04 DIAGNOSIS — M542 Cervicalgia: Secondary | ICD-10-CM | POA: Diagnosis not present

## 2022-10-04 DIAGNOSIS — M51369 Other intervertebral disc degeneration, lumbar region without mention of lumbar back pain or lower extremity pain: Secondary | ICD-10-CM

## 2022-10-04 DIAGNOSIS — Z79899 Other long term (current) drug therapy: Secondary | ICD-10-CM | POA: Diagnosis not present

## 2022-10-04 DIAGNOSIS — M5386 Other specified dorsopathies, lumbar region: Secondary | ICD-10-CM | POA: Insufficient documentation

## 2022-10-04 DIAGNOSIS — G894 Chronic pain syndrome: Secondary | ICD-10-CM

## 2022-10-04 DIAGNOSIS — M79604 Pain in right leg: Secondary | ICD-10-CM | POA: Insufficient documentation

## 2022-10-04 DIAGNOSIS — M899 Disorder of bone, unspecified: Secondary | ICD-10-CM | POA: Diagnosis not present

## 2022-10-04 DIAGNOSIS — M2578 Osteophyte, vertebrae: Secondary | ICD-10-CM | POA: Diagnosis not present

## 2022-10-04 DIAGNOSIS — M5137 Other intervertebral disc degeneration, lumbosacral region: Secondary | ICD-10-CM | POA: Diagnosis not present

## 2022-10-04 DIAGNOSIS — M5442 Lumbago with sciatica, left side: Secondary | ICD-10-CM | POA: Insufficient documentation

## 2022-10-04 DIAGNOSIS — R5383 Other fatigue: Secondary | ICD-10-CM | POA: Diagnosis not present

## 2022-10-04 DIAGNOSIS — M47812 Spondylosis without myelopathy or radiculopathy, cervical region: Secondary | ICD-10-CM | POA: Diagnosis not present

## 2022-10-04 DIAGNOSIS — I700 Atherosclerosis of aorta: Secondary | ICD-10-CM | POA: Diagnosis not present

## 2022-10-04 NOTE — Progress Notes (Signed)
Safety precautions to be maintained throughout the outpatient stay will include: orient to surroundings, keep bed in low position, maintain call bell within reach at all times, provide assistance with transfer out of bed and ambulation.  

## 2022-10-08 LAB — C-REACTIVE PROTEIN: CRP: 21 mg/L — ABNORMAL HIGH (ref 0–10)

## 2022-10-08 LAB — COMP. METABOLIC PANEL (12)
AST: 23 IU/L (ref 0–40)
Albumin/Globulin Ratio: 1.4 (ref 1.2–2.2)
Albumin: 4.6 g/dL (ref 3.7–4.7)
Alkaline Phosphatase: 84 IU/L (ref 44–121)
BUN/Creatinine Ratio: 17 (ref 10–24)
BUN: 19 mg/dL (ref 8–27)
Bilirubin Total: 0.3 mg/dL (ref 0.0–1.2)
Calcium: 9.6 mg/dL (ref 8.6–10.2)
Chloride: 100 mmol/L (ref 96–106)
Creatinine, Ser: 1.15 mg/dL (ref 0.76–1.27)
Globulin, Total: 3.3 g/dL (ref 1.5–4.5)
Glucose: 108 mg/dL — ABNORMAL HIGH (ref 70–99)
Potassium: 4.1 mmol/L (ref 3.5–5.2)
Sodium: 138 mmol/L (ref 134–144)
Total Protein: 7.9 g/dL (ref 6.0–8.5)
eGFR: 64 mL/min/{1.73_m2} (ref >59)

## 2022-10-08 LAB — MAGNESIUM: Magnesium: 2.4 mg/dL — ABNORMAL HIGH (ref 1.6–2.3)

## 2022-10-08 LAB — VITAMIN B12: Vitamin B-12: 491 pg/mL (ref 232–1245)

## 2022-10-08 LAB — 25-HYDROXY VITAMIN D LCMS D2+D3
25-Hydroxy, Vitamin D-2: <1 ng/mL
25-Hydroxy, Vitamin D-3: 33 ng/mL
25-Hydroxy, Vitamin D: 34 ng/mL

## 2022-10-08 LAB — COMPLIANCE DRUG ANALYSIS, UR

## 2022-10-08 LAB — SEDIMENTATION RATE: Sed Rate: 82 mm/hr — ABNORMAL HIGH (ref 0–30)

## 2022-10-09 ENCOUNTER — Encounter: Payer: No Typology Code available for payment source | Attending: Family Medicine | Admitting: Nutrition

## 2022-10-09 VITALS — Ht 68.0 in | Wt 254.0 lb

## 2022-10-09 DIAGNOSIS — R739 Hyperglycemia, unspecified: Secondary | ICD-10-CM | POA: Insufficient documentation

## 2022-10-09 DIAGNOSIS — E782 Mixed hyperlipidemia: Secondary | ICD-10-CM | POA: Insufficient documentation

## 2022-10-09 DIAGNOSIS — I5032 Chronic diastolic (congestive) heart failure: Secondary | ICD-10-CM | POA: Diagnosis present

## 2022-10-09 DIAGNOSIS — I10 Essential (primary) hypertension: Secondary | ICD-10-CM | POA: Insufficient documentation

## 2022-10-09 NOTE — Patient Instructions (Signed)
Goals  Get rid of m'm's and other candy and junk food Increase fresh fruits and vegetables. Try to make better choices when eating out  Walk dog for 10 minutes a day or more as tolerated

## 2022-10-09 NOTE — Progress Notes (Signed)
Medical Nutrition Therapy  Appointment Start time:  1500  Appointment End time:  1600  Primary concerns today: Obesity Referral diagnosis: e66.9 Preferred learning style: no preference.  Learning readiness: Ready    NUTRITION ASSESSMENT  82 yr old male here for obesity. "I started drinking some slimfast" to see if it will me lose weight. PMH: Heart issues, HTN, Hypelipidemia, GERD, PCP: Dr. Willey Blade. Referral from Anibal Henderson FNP. He notes he usually eats 2-3 meals per day. Doesn't cook. Eats meals away from home[place] Willing to consider some changes with food choices.  Not exercising much but does like to walk his dog.  Diet recall reveals his diet is high in fat, sodium and excess calories contrubuting to his obesity, CHF and chronic health conditions.  Anthropometrics  Wt Readings from Last 3 Encounters:  10/09/22 254 lb (115.2 kg)  10/04/22 244 lb (110.7 kg)  12/06/21 260 lb 12.8 oz (118.3 kg)   Ht Readings from Last 3 Encounters:  10/09/22 '5\' 8"'$  (1.727 m)  10/04/22 '5\' 8"'$  (1.727 m)  02/10/21 '5\' 8"'$  (1.727 m)   Body mass index is 38.62 kg/m. '@BMIFA'$ @ Facility age limit for growth %iles is 20 years. Facility age limit for growth %iles is 20 years.   Clinical Medical Hx: see chart Medications: see chart Labs:  This SmartLink has not been configured with any valid records.      Latest Ref Rng & Units 10/04/2022    3:56 PM 10/08/2019    3:52 PM 11/04/2018   12:21 PM  CMP  Glucose 70 - 99 mg/dL 108   102   BUN 8 - 27 mg/dL 19   21   Creatinine 0.76 - 1.27 mg/dL 1.15   1.36   Sodium 134 - 144 mmol/L 138   138   Potassium 3.5 - 5.2 mmol/L 4.1   4.1   Chloride 96 - 106 mmol/L 100   102   CO2 22 - 32 mmol/L   26   Calcium 8.6 - 10.2 mg/dL 9.6   9.3   Total Protein 6.0 - 8.5 g/dL 7.9  7.4    Total Bilirubin 0.0 - 1.2 mg/dL 0.3     Alkaline Phos 44 - 121 IU/L 84     AST 0 - 40 IU/L 23      Lipid Panel     Component Value Date/Time   CHOL  106 08/09/2017 0238   TRIG 82 08/09/2017 0238   HDL 30 (L) 08/09/2017 0238   CHOLHDL 3.5 08/09/2017 0238   VLDL 16 08/09/2017 0238   LDLCALC 60 08/09/2017 0238    Notable Signs/Symptoms: Tired  Lifestyle & Dietary Hx Lives by himself. Eats most meals away from home. Walks his dog.  Estimated daily fluid intake: 30 oz Supplements:  Sleep: varies. Stress / self-care:  Current average weekly physical activity: walks some  24-Hr Dietary Recall First Meal: Slim fast Snack:  Second Meal: Vegetable beef soup, 3 [place] sausage, 1 1/2 toast wheat with jelly, Coffee, water Snack:  Third Meal: slim jim.  Snack:  Beverages: water, misc  Estimated Energy Needs Calories: 1500 Carbohydrate: 170g Protein: 112g Fat: 42g   NUTRITION DIAGNOSIS  NI-1.7 Predicted excessive energy intake As related to Obesity.  As evidenced by BMI > 30 and diet recall.Marland Kitchen   NUTRITION INTERVENTION  Nutrition education (E-1) on the following topics:  Lifestyle Medicine  - Whole Food, Plant Predominant Nutrition is highly recommended: Eat Plenty of vegetables, Mushrooms, fruits, Legumes, Whole Grains,  Nuts, seeds in lieu of processed meats, processed snacks/pastries red meat, poultry, eggs.    -It is better to avoid simple carbohydrates including: Cakes, Sweet Desserts, Ice Cream, Soda (diet and regular), Sweet Tea, Candies, Chips, Cookies, Store Bought Juices, Alcohol in Excess of  1-2 drinks a day, Lemonade,  Artificial Sweeteners, Doughnuts, Coffee Creamers, "Sugar-free" Products, etc, etc.  This is not a complete list.....  Exercise: If you are able: 30 -60 minutes a day ,4 days a week, or 150 minutes a week.  The longer the better.  Combine stretch, strength, and aerobic activities.  If you were told in the past that you have high risk for cardiovascular diseases, you may seek evaluation by your heart doctor prior to initiating moderate to intense exercise programs.   Handouts Provided Include   Lifestyle Medicine  Learning Style & Readiness for Change Teaching method utilized: Visual & Auditory  Demonstrated degree of understanding via: Teach Back  Barriers to learning/adherence to lifestyle change: None  Goals Established by Pt Goals  Get rid of m'm's and other candy and junk food Increase fresh fruits and vegetables. Try to make better choices when eating out  Walk dog for 10 minutes a day or more as tolerated   MONITORING & EVALUATION Dietary intake, weekly physical activity, and weight  in 2-3 months.  Next Steps  Patient is to work on making better food choices when eating out.Marland Kitchen

## 2022-10-13 ENCOUNTER — Ambulatory Visit: Payer: No Typology Code available for payment source

## 2022-10-18 ENCOUNTER — Ambulatory Visit: Payer: No Typology Code available for payment source

## 2022-10-25 ENCOUNTER — Encounter: Payer: Self-pay | Admitting: Nutrition

## 2022-10-30 DIAGNOSIS — Z79899 Other long term (current) drug therapy: Secondary | ICD-10-CM | POA: Diagnosis not present

## 2022-10-30 DIAGNOSIS — I482 Chronic atrial fibrillation, unspecified: Secondary | ICD-10-CM | POA: Diagnosis not present

## 2022-10-30 DIAGNOSIS — I5032 Chronic diastolic (congestive) heart failure: Secondary | ICD-10-CM | POA: Diagnosis not present

## 2022-11-01 ENCOUNTER — Ambulatory Visit: Payer: Medicare PPO | Admitting: Pain Medicine

## 2022-11-06 DIAGNOSIS — M5030 Other cervical disc degeneration, unspecified cervical region: Secondary | ICD-10-CM | POA: Diagnosis not present

## 2022-11-06 DIAGNOSIS — D6869 Other thrombophilia: Secondary | ICD-10-CM | POA: Diagnosis not present

## 2022-11-06 DIAGNOSIS — I10 Essential (primary) hypertension: Secondary | ICD-10-CM | POA: Diagnosis not present

## 2022-11-06 DIAGNOSIS — E785 Hyperlipidemia, unspecified: Secondary | ICD-10-CM | POA: Diagnosis not present

## 2022-11-16 ENCOUNTER — Encounter: Payer: Self-pay | Admitting: Radiology

## 2022-11-21 ENCOUNTER — Ambulatory Visit: Payer: Medicare Other | Admitting: Nutrition

## 2022-12-13 ENCOUNTER — Ambulatory Visit (INDEPENDENT_AMBULATORY_CARE_PROVIDER_SITE_OTHER): Payer: Medicare PPO

## 2022-12-13 DIAGNOSIS — I495 Sick sinus syndrome: Secondary | ICD-10-CM

## 2022-12-13 LAB — CUP PACEART REMOTE DEVICE CHECK
Battery Remaining Longevity: 61 mo
Battery Remaining Percentage: 56 %
Battery Voltage: 2.99 V
Brady Statistic RV Percent Paced: 99 %
Date Time Interrogation Session: 20240327020020
Implantable Lead Connection Status: 753985
Implantable Lead Connection Status: 753985
Implantable Lead Implant Date: 20090903
Implantable Lead Implant Date: 20090903
Implantable Lead Location: 753859
Implantable Lead Location: 753860
Implantable Pulse Generator Implant Date: 20200224
Lead Channel Impedance Value: 460 Ohm
Lead Channel Pacing Threshold Amplitude: 0.75 V
Lead Channel Pacing Threshold Pulse Width: 0.5 ms
Lead Channel Sensing Intrinsic Amplitude: 12 mV
Lead Channel Setting Pacing Amplitude: 2.5 V
Lead Channel Setting Pacing Pulse Width: 0.5 ms
Lead Channel Setting Sensing Sensitivity: 4 mV
Pulse Gen Model: 2272
Pulse Gen Serial Number: 9107097

## 2022-12-26 ENCOUNTER — Ambulatory Visit: Payer: No Typology Code available for payment source | Attending: Internal Medicine | Admitting: Internal Medicine

## 2022-12-26 ENCOUNTER — Encounter: Payer: Self-pay | Admitting: Internal Medicine

## 2022-12-26 VITALS — BP 124/62 | HR 70 | Ht 68.0 in | Wt 249.0 lb

## 2022-12-26 DIAGNOSIS — I4819 Other persistent atrial fibrillation: Secondary | ICD-10-CM | POA: Diagnosis not present

## 2022-12-26 DIAGNOSIS — I495 Sick sinus syndrome: Secondary | ICD-10-CM | POA: Diagnosis not present

## 2022-12-26 LAB — CUP PACEART INCLINIC DEVICE CHECK
Battery Remaining Longevity: 60 mo
Battery Voltage: 2.99 V
Brady Statistic RA Percent Paced: 0 %
Brady Statistic RV Percent Paced: 99 %
Date Time Interrogation Session: 20240409113141
Implantable Lead Connection Status: 753985
Implantable Lead Connection Status: 753985
Implantable Lead Implant Date: 20090903
Implantable Lead Implant Date: 20090903
Implantable Lead Location: 753859
Implantable Lead Location: 753860
Implantable Pulse Generator Implant Date: 20200224
Lead Channel Impedance Value: 512.5 Ohm
Lead Channel Pacing Threshold Amplitude: 0.75 V
Lead Channel Pacing Threshold Amplitude: 0.75 V
Lead Channel Pacing Threshold Amplitude: 0.875 V
Lead Channel Pacing Threshold Pulse Width: 0.5 ms
Lead Channel Pacing Threshold Pulse Width: 0.5 ms
Lead Channel Pacing Threshold Pulse Width: 0.5 ms
Lead Channel Sensing Intrinsic Amplitude: 3.4 mV
Lead Channel Sensing Intrinsic Amplitude: 9 mV
Lead Channel Setting Pacing Amplitude: 2.5 V
Lead Channel Setting Pacing Pulse Width: 0.5 ms
Lead Channel Setting Sensing Sensitivity: 4 mV
Pulse Gen Model: 2272
Pulse Gen Serial Number: 9107097

## 2022-12-26 NOTE — Patient Instructions (Signed)
Medication Instructions:  Your physician recommends that you continue on your current medications as directed. Please refer to the Current Medication list given to you today.  *If you need a refill on your cardiac medications before your next appointment, please call your pharmacy*   Lab Work: NONE   If you have labs (blood work) drawn today and your tests are completely normal, you will receive your results only by: MyChart Message (if you have MyChart) OR A paper copy in the mail If you have any lab test that is abnormal or we need to change your treatment, we will call you to review the results.   Testing/Procedures: NONE    Follow-Up: At [facility], you and your health needs are our priority.  As part of our continuing mission to provide you with exceptional heart care, we have created designated Provider Care Teams.  These Care Teams include your primary Cardiologist (physician) and Advanced Practice Providers (APPs -  Physician Assistants and Nurse Practitioners) who all work together to provide you with the care you need, when you need it.  We recommend signing up for the patient portal called "MyChart".  Sign up information is provided on this After Visit Summary.  MyChart is used to connect with patients for Virtual Visits (Telemedicine).  Patients are able to view lab/test results, encounter notes, upcoming appointments, etc.  Non-urgent messages can be sent to your provider as well.   To learn more about what you can do with MyChart, go to [email or web address].    Your next appointment:   1 year(s)  Provider:   Gregg Taylor, MD    Other Instructions Thank you for choosing [facility]!    

## 2022-12-26 NOTE — Progress Notes (Signed)
HPI [name] returns today for followup. He is a pleasant 82 yo man with  Chronic diastolic heart failure, persistent atrial fib, CHB, s/p PPM insertion. He has had trouble with weight gain though he is down about 10 lbs from his last visit.. No syncope. He has minimal edema. He admits to being increasingly sedentary except that his 94 yo dog keeps him outside having to frequently go to the bathroom. He denies chest pain. His activity is limited by arthritis which involves multiple joints. He admits to some dietary indiscretion. Allergies  Allergen Reactions   Bee Venom Anaphylaxis and Swelling   Shellfish Allergy Hives    Accelerated allergic reaction; questionable laryngospasm or laryngeal edema with some difficulty breathing prompting ED evaluation and treatment at [facility]   Penicillins Rash    Has patient had a PCN reaction causing immediate rash, facial/tongue/throat swelling, SOB or lightheadedness with hypotension: No Has patient had a PCN reaction causing severe rash involving mucus membranes or skin necrosis: No Has patient had a PCN reaction that required hospitalization No Has patient had a PCN reaction occurring within the last 10 years: No If all of the above answers are "NO", then may proceed with Cephalosporin use.      Current Outpatient Medications  Medication Sig Dispense Refill   acetaminophen (TYLENOL) 500 MG tablet Take 1,000 mg by mouth 2 (two) times daily.     antiseptic oral rinse (BIOTENE) LIQD 15 mLs by Mouth Rinse route as needed for dry mouth.     apixaban (ELIQUIS) 5 MG TABS tablet Take 1 tablet (5 mg total) by mouth 2 (two) times daily. 60 tablet 1   diltiazem (TIAZAC) 120 MG 24 hr capsule Take 120 mg by mouth every evening.      diltiazem (TIAZAC) 300 MG 24 hr capsule Take 300 mg by mouth daily.      furosemide (LASIX) 40 MG tablet Take 40 mg by mouth 2 (two) times daily.     gabapentin (NEURONTIN) 300 MG capsule Take 1,200 mg by mouth 2 (two)  times daily.      hydrocortisone (ANUSOL-HC) 2.5 % rectal cream Place 1 application rectally as needed for hemorrhoids (after each bowel movement).      ketoconazole (NIZORAL) 2 % cream Apply 1 application topically daily as needed (skin irritation).      lidocaine (XYLOCAINE) 5 % ointment Apply 1 application topically 3 (three) times daily as needed (back pain.).     metoprolol tartrate (LOPRESSOR) 25 MG tablet Take 0.5 tablets (12.5 mg total) by mouth 2 (two) times daily. 90 tablet 3   pantoprazole (PROTONIX) 40 MG tablet Take 40 mg by mouth daily.     rosuvastatin (CRESTOR) 10 MG tablet Take 10 mg by mouth at bedtime.     Tamsulosin HCl (FLOMAX) 0.4 MG CAPS Take 0.4 mg by mouth daily.     traMADol (ULTRAM) 50 MG tablet Take by mouth every 6 (six) hours as needed.     zinc oxide (BALMEX) 11.3 % CREA cream Apply 1 application topically 2 (two) times daily as needed (skin breakdown).      No current facility-administered medications for this visit.     Past Medical History:  Diagnosis Date   Atrial flutter    a. s/p ablation 01/2015.   BPH (benign prostatic hyperplasia)    Cerebrovascular disease    carotid bruit; no focal stenosis by carotid ultrasound in 05/2008   Congestive heart failure (CHF)    Dysrhythmia  General weakness    +malaise, exercise intolerance, and near syncope   Hypertension    Low back pain    history of traumax2   Paroxysmal atrial fibrillation    on Eliquis   Presence of permanent cardiac pacemaker 2009   Sleep apnea    c pap at night   Tachy-brady syndrome    a. s/p St. Jude pacemaker 2009.    ROS:   All systems reviewed and negative except as noted in the HPI.   Past Surgical History:  Procedure Laterality Date   ABLATION     ARTERY BIOPSY Right 07/07/2019   Procedure: BIOPSY TEMPORAL ARTERY;  Surgeon: Franky Macho, MD;  Location: [facility];  Service: General;  Laterality: Right;   CATARACT EXTRACTION     Bilateral   CERVICAL DISCECTOMY  2006    COLONOSCOPY WITH PROPOFOL N/A 01/26/2017   Procedure: COLONOSCOPY WITH PROPOFOL;  Surgeon: Malissa Hippo, MD;  Location: [facility];  Service: Endoscopy;  Laterality: N/A;  8:30   ELECTROPHYSIOLOGIC STUDY N/A 01/18/2015   Procedure: A-Flutter/A-Tach/Svt Ablation;  Surgeon: Marinus Maw, MD;  [facility]: [facility];  Service: Cardiovascular;  Laterality: N/A;   INSERT / REPLACE / REMOVE PACEMAKER     KNEE ARTHROSCOPY WITH MEDIAL MENISECTOMY Left 06/03/2015   Procedure: KNEE ARTHROSCOPY WITH MEDIAL MENISECTOMY;  Surgeon: Darreld Mclean, MD;  Location: [facility];  Service: Orthopedics;  Laterality: Left;   PACEMAKER INSERTION  1966   POLYPECTOMY  01/26/2017   Procedure: POLYPECTOMY;  Surgeon: Malissa Hippo, MD;  Location: [facility];  Service: Endoscopy;;  colon   PPM GENERATOR CHANGEOUT N/A 11/11/2018   Procedure: PPM GENERATOR CHANGEOUT;  Surgeon: Marinus Maw, MD;  Location: [facility] INVASIVE CV LAB;  Service: Cardiovascular;  Laterality: N/A;     Family History  Problem Relation Age of Onset   Hypertension Other    Neuropathy Neg Hx    Stroke Neg Hx      Social History   Socioeconomic History   Marital status: Divorced    Spouse name: Not on file   Number of children: 3   Years of education: 12+   Highest education level: Not on file  Occupational History   Occupation: Retired  Tobacco Use   Smoking status: Former    Packs/day: 1.00    Years: 0.50    Additional pack years: 0.00    Total pack years: 0.50    Types: Cigarettes    Quit date: 03/27/1962    Years since quitting: 60.7   Smokeless tobacco: Never  Vaping Use   Vaping Use: Never used  Substance and Sexual Activity   Alcohol use: No    Alcohol/week: 0.0 standard drinks of alcohol   Drug use: No   Sexual activity: Not Currently  Other Topics Concern   Not on file  Social History Narrative   Divorced with 3 adult children. Semi retired.    Caffeine use: seldom    Lives alone   Social  Determinants of Health   Financial Resource Strain: Not on file  Food Insecurity: Not on file  Transportation Needs: Not on file  Physical Activity: Not on file  Stress: Not on file  Social Connections: Not on file  Intimate Partner Violence: Not on file     BP 124/62   Pulse 70   Ht 5\' 8"  (1.727 m)   Wt 249 lb (112.9 kg)   SpO2 91%   BMI 37.86 kg/m  Physical Exam:  Well appearing NAD HEENT: Unremarkable Neck:  No JVD, no thyromegally Lymphatics:  No adenopathy Back:  No CVA tenderness Lungs:  Clear with no wheezes HEART:  Regular rate rhythm, no murmurs, no rubs, no clicks Abd:  soft, positive bowel sounds, no organomegally, no rebound, no guarding Ext:  2 plus pulses, no edema, no cyanosis, no clubbing Skin:  No rashes no nodules Neuro:  CN II through XII intact, motor grossly intact  EKG - atrial fib with ventricular pacing  DEVICE  Normal device function.  See PaceArt for details.   Assess/Plan: Atrial fib - his VR is well controlled. No change in his meds. Obesity - his weight is down 10 lbs from his last visit.  Coags - he denies bleeding on eliquis. Heart block - his escape is in the 50's today. No change in his PM programming.  Gregory GowdaGregg Darron Stuck,MD

## 2023-01-22 NOTE — Progress Notes (Signed)
Remote pacemaker transmission.   

## 2023-01-30 DIAGNOSIS — I10 Essential (primary) hypertension: Secondary | ICD-10-CM | POA: Diagnosis not present

## 2023-01-30 DIAGNOSIS — I5032 Chronic diastolic (congestive) heart failure: Secondary | ICD-10-CM | POA: Diagnosis not present

## 2023-01-30 DIAGNOSIS — Z79899 Other long term (current) drug therapy: Secondary | ICD-10-CM | POA: Diagnosis not present

## 2023-02-06 DIAGNOSIS — I5032 Chronic diastolic (congestive) heart failure: Secondary | ICD-10-CM | POA: Diagnosis not present

## 2023-02-06 DIAGNOSIS — I4819 Other persistent atrial fibrillation: Secondary | ICD-10-CM | POA: Diagnosis not present

## 2023-03-14 ENCOUNTER — Ambulatory Visit (INDEPENDENT_AMBULATORY_CARE_PROVIDER_SITE_OTHER): Payer: Medicare PPO

## 2023-03-14 DIAGNOSIS — I495 Sick sinus syndrome: Secondary | ICD-10-CM

## 2023-03-14 LAB — CUP PACEART REMOTE DEVICE CHECK
Battery Remaining Longevity: 60 mo
Battery Remaining Percentage: 54 %
Battery Voltage: 2.99 V
Brady Statistic RV Percent Paced: 99 %
Date Time Interrogation Session: 20240626020012
Implantable Lead Connection Status: 753985
Implantable Lead Connection Status: 753985
Implantable Lead Implant Date: 20090903
Implantable Lead Implant Date: 20090903
Implantable Lead Location: 753859
Implantable Lead Location: 753860
Implantable Pulse Generator Implant Date: 20200224
Lead Channel Impedance Value: 510 Ohm
Lead Channel Pacing Threshold Amplitude: 0.75 V
Lead Channel Pacing Threshold Pulse Width: 0.5 ms
Lead Channel Sensing Intrinsic Amplitude: 12 mV
Lead Channel Setting Pacing Amplitude: 2.5 V
Lead Channel Setting Pacing Pulse Width: 0.5 ms
Lead Channel Setting Sensing Sensitivity: 4 mV
Pulse Gen Model: 2272
Pulse Gen Serial Number: 9107097

## 2023-04-03 NOTE — Progress Notes (Signed)
 Remote pacemaker transmission.   

## 2023-05-01 DIAGNOSIS — I10 Essential (primary) hypertension: Secondary | ICD-10-CM | POA: Diagnosis not present

## 2023-05-01 DIAGNOSIS — I5032 Chronic diastolic (congestive) heart failure: Secondary | ICD-10-CM | POA: Diagnosis not present

## 2023-05-01 DIAGNOSIS — R7303 Prediabetes: Secondary | ICD-10-CM | POA: Diagnosis not present

## 2023-05-01 DIAGNOSIS — E785 Hyperlipidemia, unspecified: Secondary | ICD-10-CM | POA: Diagnosis not present

## 2023-05-01 DIAGNOSIS — I4819 Other persistent atrial fibrillation: Secondary | ICD-10-CM | POA: Diagnosis not present

## 2023-05-01 DIAGNOSIS — Z79899 Other long term (current) drug therapy: Secondary | ICD-10-CM | POA: Diagnosis not present

## 2023-05-08 DIAGNOSIS — E785 Hyperlipidemia, unspecified: Secondary | ICD-10-CM | POA: Diagnosis not present

## 2023-05-08 DIAGNOSIS — I5032 Chronic diastolic (congestive) heart failure: Secondary | ICD-10-CM | POA: Diagnosis not present

## 2023-05-08 DIAGNOSIS — I480 Paroxysmal atrial fibrillation: Secondary | ICD-10-CM | POA: Diagnosis not present

## 2023-06-13 ENCOUNTER — Ambulatory Visit: Payer: Medicare PPO

## 2023-06-13 DIAGNOSIS — I495 Sick sinus syndrome: Secondary | ICD-10-CM

## 2023-06-13 LAB — CUP PACEART REMOTE DEVICE CHECK
Battery Remaining Longevity: 58 mo
Battery Remaining Percentage: 52 %
Battery Voltage: 2.99 V
Brady Statistic RV Percent Paced: 99 %
Date Time Interrogation Session: 20240925030657
Implantable Lead Connection Status: 753985
Implantable Lead Connection Status: 753985
Implantable Lead Implant Date: 20090903
Implantable Lead Implant Date: 20090903
Implantable Lead Location: 753859
Implantable Lead Location: 753860
Implantable Pulse Generator Implant Date: 20200224
Lead Channel Impedance Value: 490 Ohm
Lead Channel Pacing Threshold Amplitude: 0.75 V
Lead Channel Pacing Threshold Pulse Width: 0.5 ms
Lead Channel Sensing Intrinsic Amplitude: 12 mV
Lead Channel Setting Pacing Amplitude: 2.5 V
Lead Channel Setting Pacing Pulse Width: 0.5 ms
Lead Channel Setting Sensing Sensitivity: 4 mV
Pulse Gen Model: 2272
Pulse Gen Serial Number: 9107097

## 2023-06-29 NOTE — Progress Notes (Signed)
Remote pacemaker transmission.   

## 2023-08-15 DIAGNOSIS — Z79899 Other long term (current) drug therapy: Secondary | ICD-10-CM | POA: Diagnosis not present

## 2023-08-15 DIAGNOSIS — E785 Hyperlipidemia, unspecified: Secondary | ICD-10-CM | POA: Diagnosis not present

## 2023-08-15 DIAGNOSIS — I5032 Chronic diastolic (congestive) heart failure: Secondary | ICD-10-CM | POA: Diagnosis not present

## 2023-08-22 DIAGNOSIS — I5032 Chronic diastolic (congestive) heart failure: Secondary | ICD-10-CM | POA: Diagnosis not present

## 2023-08-22 DIAGNOSIS — I480 Paroxysmal atrial fibrillation: Secondary | ICD-10-CM | POA: Diagnosis not present

## 2023-09-11 ENCOUNTER — Ambulatory Visit (INDEPENDENT_AMBULATORY_CARE_PROVIDER_SITE_OTHER): Payer: No Typology Code available for payment source

## 2023-09-11 DIAGNOSIS — I495 Sick sinus syndrome: Secondary | ICD-10-CM

## 2023-09-11 LAB — CUP PACEART REMOTE DEVICE CHECK
Battery Remaining Longevity: 55 mo
Battery Remaining Percentage: 50 %
Battery Voltage: 2.99 V
Brady Statistic RV Percent Paced: 99 %
Date Time Interrogation Session: 20241224040021
Implantable Lead Connection Status: 753985
Implantable Lead Connection Status: 753985
Implantable Lead Implant Date: 20090903
Implantable Lead Implant Date: 20090903
Implantable Lead Location: 753859
Implantable Lead Location: 753860
Implantable Pulse Generator Implant Date: 20200224
Lead Channel Impedance Value: 490 Ohm
Lead Channel Pacing Threshold Amplitude: 0.75 V
Lead Channel Pacing Threshold Pulse Width: 0.5 ms
Lead Channel Sensing Intrinsic Amplitude: 12 mV
Lead Channel Setting Pacing Amplitude: 2.5 V
Lead Channel Setting Pacing Pulse Width: 0.5 ms
Lead Channel Setting Sensing Sensitivity: 4 mV
Pulse Gen Model: 2272
Pulse Gen Serial Number: 9107097

## 2023-10-22 NOTE — Progress Notes (Signed)
 Remote pacemaker transmission.

## 2023-11-20 DIAGNOSIS — Z79899 Other long term (current) drug therapy: Secondary | ICD-10-CM | POA: Diagnosis not present

## 2023-11-20 DIAGNOSIS — I5032 Chronic diastolic (congestive) heart failure: Secondary | ICD-10-CM | POA: Diagnosis not present

## 2023-11-20 DIAGNOSIS — I4821 Permanent atrial fibrillation: Secondary | ICD-10-CM | POA: Diagnosis not present

## 2023-11-20 DIAGNOSIS — E785 Hyperlipidemia, unspecified: Secondary | ICD-10-CM | POA: Diagnosis not present

## 2023-11-26 DIAGNOSIS — N400 Enlarged prostate without lower urinary tract symptoms: Secondary | ICD-10-CM | POA: Diagnosis not present

## 2023-11-26 DIAGNOSIS — I10 Essential (primary) hypertension: Secondary | ICD-10-CM | POA: Diagnosis not present

## 2023-11-26 DIAGNOSIS — I5032 Chronic diastolic (congestive) heart failure: Secondary | ICD-10-CM | POA: Diagnosis not present

## 2023-11-29 ENCOUNTER — Telehealth: Payer: Self-pay | Admitting: *Deleted

## 2023-11-29 NOTE — Telephone Encounter (Signed)
    Primary Cardiologist: Lewayne Bunting, MD  Chart reviewed as part of pre-operative protocol coverage. Simple dental extractions are considered low risk procedures per guidelines and generally do not require any specific cardiac clearance. It is also generally accepted that for simple extractions and dental cleanings, there is no need to interrupt blood thinner therapy.   SBE prophylaxis is not required for the patient.  I will route this recommendation to the requesting party via Epic fax function and remove from pre-op pool.  Please call with questions.  Ronney Asters, NP 11/29/2023, 11:33 AM

## 2023-11-29 NOTE — Telephone Encounter (Signed)
 [place] from dental office called in, states pt in the chair now. Pt was planned for an extraction today until discovered pt is on Eliquis.       Pre-operative Risk Assessment    Patient Name: [name].  DOB: 1940/12/20 MRN: [id number]   Date of last office visit: 12/26/22 DR. Ladona Ridgel Date of next office visit: 03/13/24 DR. TAYLOR  Request for Surgical Clearance    Procedure:  Dental Extraction - Amount of Teeth to be Pulled:  1 DENTAL EXTRACTION-WILL START OUT AS SURGICAL THOUGH MAY END UP JUST BEING SIMPLE  Date of Surgery:  Clearance 11/29/23                                Surgeon:  DR. Camillo Flaming, DDS  Surgeon's Group or Practice Name:  [facility] Phone number:  [phone] Fax number:  [phone]   Type of Clearance Requested:   - Medical  - Pharmacy:  Hold Apixaban (Eliquis)     Type of Anesthesia:  Local    Additional requests/questions:    Elpidio Anis   11/29/2023, 11:22 AM

## 2023-12-12 ENCOUNTER — Ambulatory Visit: Payer: Medicare PPO

## 2023-12-12 DIAGNOSIS — I495 Sick sinus syndrome: Secondary | ICD-10-CM

## 2023-12-12 LAB — CUP PACEART REMOTE DEVICE CHECK
Battery Remaining Longevity: 53 mo
Battery Remaining Percentage: 47 %
Battery Voltage: 2.98 V
Brady Statistic RV Percent Paced: 99 %
Date Time Interrogation Session: 20250326020015
Implantable Lead Connection Status: 753985
Implantable Lead Connection Status: 753985
Implantable Lead Implant Date: 20090903
Implantable Lead Implant Date: 20090903
Implantable Lead Location: 753859
Implantable Lead Location: 753860
Implantable Pulse Generator Implant Date: 20200224
Lead Channel Impedance Value: 510 Ohm
Lead Channel Pacing Threshold Amplitude: 0.75 V
Lead Channel Pacing Threshold Pulse Width: 0.5 ms
Lead Channel Sensing Intrinsic Amplitude: 12 mV
Lead Channel Setting Pacing Amplitude: 2.5 V
Lead Channel Setting Pacing Pulse Width: 0.5 ms
Lead Channel Setting Sensing Sensitivity: 4 mV
Pulse Gen Model: 2272
Pulse Gen Serial Number: 9107097

## 2023-12-13 ENCOUNTER — Encounter: Payer: Self-pay | Admitting: Internal Medicine

## 2023-12-31 DIAGNOSIS — H43393 Other vitreous opacities, bilateral: Secondary | ICD-10-CM | POA: Diagnosis not present

## 2024-01-24 NOTE — Progress Notes (Signed)
 Remote pacemaker transmission.

## 2024-02-20 DIAGNOSIS — I4821 Permanent atrial fibrillation: Secondary | ICD-10-CM | POA: Diagnosis not present

## 2024-02-20 DIAGNOSIS — Z79899 Other long term (current) drug therapy: Secondary | ICD-10-CM | POA: Diagnosis not present

## 2024-02-20 DIAGNOSIS — I10 Essential (primary) hypertension: Secondary | ICD-10-CM | POA: Diagnosis not present

## 2024-02-20 DIAGNOSIS — I5032 Chronic diastolic (congestive) heart failure: Secondary | ICD-10-CM | POA: Diagnosis not present

## 2024-02-26 DIAGNOSIS — I10 Essential (primary) hypertension: Secondary | ICD-10-CM | POA: Diagnosis not present

## 2024-02-26 DIAGNOSIS — I5032 Chronic diastolic (congestive) heart failure: Secondary | ICD-10-CM | POA: Diagnosis not present

## 2024-02-26 DIAGNOSIS — I4821 Permanent atrial fibrillation: Secondary | ICD-10-CM | POA: Diagnosis not present

## 2024-03-12 ENCOUNTER — Ambulatory Visit (INDEPENDENT_AMBULATORY_CARE_PROVIDER_SITE_OTHER): Payer: Medicare PPO

## 2024-03-12 DIAGNOSIS — I495 Sick sinus syndrome: Secondary | ICD-10-CM | POA: Diagnosis not present

## 2024-03-12 LAB — CUP PACEART REMOTE DEVICE CHECK
Battery Remaining Longevity: 50 mo
Battery Remaining Percentage: 45 %
Battery Voltage: 2.98 V
Brady Statistic RV Percent Paced: 99 %
Date Time Interrogation Session: 20250625020014
Implantable Lead Connection Status: 753985
Implantable Lead Connection Status: 753985
Implantable Lead Implant Date: 20090903
Implantable Lead Implant Date: 20090903
Implantable Lead Location: 753859
Implantable Lead Location: 753860
Implantable Pulse Generator Implant Date: 20200224
Lead Channel Impedance Value: 510 Ohm
Lead Channel Pacing Threshold Amplitude: 0.75 V
Lead Channel Pacing Threshold Pulse Width: 0.5 ms
Lead Channel Sensing Intrinsic Amplitude: 11.9 mV
Lead Channel Setting Pacing Amplitude: 2.5 V
Lead Channel Setting Pacing Pulse Width: 0.5 ms
Lead Channel Setting Sensing Sensitivity: 4 mV
Pulse Gen Model: 2272
Pulse Gen Serial Number: 9107097

## 2024-03-13 ENCOUNTER — Encounter: Payer: Self-pay | Admitting: Internal Medicine

## 2024-03-13 ENCOUNTER — Ambulatory Visit: Attending: Internal Medicine | Admitting: Internal Medicine

## 2024-03-13 VITALS — BP 132/70 | HR 72 | Ht 68.0 in | Wt 257.6 lb

## 2024-03-13 DIAGNOSIS — I4819 Other persistent atrial fibrillation: Secondary | ICD-10-CM

## 2024-03-13 DIAGNOSIS — I495 Sick sinus syndrome: Secondary | ICD-10-CM | POA: Diagnosis not present

## 2024-03-13 NOTE — Patient Instructions (Signed)

## 2024-03-13 NOTE — Progress Notes (Signed)
 HPI [name] returns today for followup. He is a pleasant 83 yo man with  Chronic diastolic heart failure, persistent atrial fib, CHB, s/p PPM insertion. He has had trouble with weight gain though he is up about 10 lbs from his last visit. No syncope. He has minimal edema. He denies chest pain. His activity is limited by arthritis which involves multiple joints. He admits to some dietary indiscretion.  Allergies  Allergen Reactions   Bee Venom Anaphylaxis and Swelling   Shellfish Allergy Hives    Accelerated allergic reaction; questionable laryngospasm or laryngeal edema with some difficulty breathing prompting ED evaluation and [facility] at [facility]   Penicillins Rash    Has patient had a PCN reaction causing immediate rash, facial/tongue/throat swelling, SOB or lightheadedness with hypotension: No Has patient had a PCN reaction causing severe rash involving mucus membranes or skin necrosis: No Has patient had a PCN reaction that required hospitalization No Has patient had a PCN reaction occurring within the last 10 years: No If all of the above answers are NO, then may proceed with Cephalosporin use.      Current Outpatient Medications  Medication Sig Dispense Refill   acetaminophen  (TYLENOL ) 500 MG tablet Take 1,000 mg by mouth 2 (two) times daily.     antiseptic oral rinse (BIOTENE) LIQD 15 mLs by Mouth Rinse route as needed for dry mouth.     apixaban  (ELIQUIS ) 5 MG TABS tablet Take 1 tablet (5 mg total) by mouth 2 (two) times daily. 60 tablet 1   diltiazem  (TIAZAC ) 120 MG 24 hr capsule Take 120 mg by mouth every evening.      diltiazem  (TIAZAC ) 300 MG 24 hr capsule Take 300 mg by mouth daily.      furosemide  (LASIX ) 40 MG tablet Take 40 mg by mouth 2 (two) times daily.     gabapentin  (NEURONTIN ) 300 MG capsule Take 1,200 mg by mouth 2 (two) times daily.      hydrocortisone  (ANUSOL -HC) 2.5 % rectal cream Place 1 application rectally as needed for hemorrhoids (after each  bowel movement).      ketoconazole (NIZORAL) 2 % cream Apply 1 application topically daily as needed (skin irritation).      lidocaine  (XYLOCAINE ) 5 % ointment Apply 1 application topically 3 (three) times daily as needed (back pain.).     metoprolol  tartrate (LOPRESSOR ) 25 MG tablet Take 0.5 tablets (12.5 mg total) by mouth 2 (two) times daily. 90 tablet 3   pantoprazole  (PROTONIX ) 40 MG tablet Take 40 mg by mouth daily.     rosuvastatin (CRESTOR) 10 MG tablet Take 10 mg by mouth at bedtime.     Tamsulosin  HCl (FLOMAX ) 0.4 MG CAPS Take 0.4 mg by mouth daily.     traMADol  (ULTRAM ) 50 MG tablet Take by mouth every 6 (six) hours as needed.     zinc  oxide (BALMEX) 11.3 % CREA cream Apply 1 application topically 2 (two) times daily as needed (skin breakdown).      No current facility-administered medications for this visit.     Past Medical History:  Diagnosis Date   Atrial flutter (HCC)    a. s/p ablation 01/2015.   BPH (benign prostatic hyperplasia)    Cerebrovascular disease    carotid bruit; no focal stenosis by carotid ultrasound in 05/2008   Congestive heart failure (CHF) (HCC)    Dysrhythmia    General weakness    +malaise, exercise intolerance, and near syncope   Hypertension  Low back pain    history of traumax2   Paroxysmal atrial fibrillation (HCC)    on Eliquis    Presence of permanent cardiac pacemaker 2009   Sleep apnea    c pap at night   Tachy-brady syndrome [facility])    a. s/p St. Jude pacemaker 2009.    ROS:   All systems reviewed and negative except as noted in the HPI.   Past Surgical History:  Procedure Laterality Date   ABLATION     ARTERY BIOPSY Right 07/07/2019   Procedure: BIOPSY TEMPORAL ARTERY;  Surgeon: Mavis Anes, MD;  Location: [facility];  Service: General;  Laterality: Right;   CATARACT EXTRACTION     Bilateral   CERVICAL DISCECTOMY  2006   COLONOSCOPY WITH PROPOFOL  N/A 01/26/2017   Procedure: COLONOSCOPY WITH PROPOFOL ;  Surgeon: Golda Claudis PENNER, MD;  Location[facility];  Service: Endoscopy;  Laterality: N/A;  8:30   ELECTROPHYSIOLOGIC STUDY N/A 01/18/2015   Procedure: A-Flutter/A-Tach/Svt Ablation;  Surgeon: Danelle LELON Birmingham, MD;  Location[facility];  Service: Cardiovascular;  Laterality: N/A;   INSERT / REPLACE / REMOVE PACEMAKER     KNEE ARTHROSCOPY WITH MEDIAL MENISECTOMY Left 06/03/2015   Procedure: KNEE ARTHROSCOPY WITH MEDIAL MENISECTOMY;  Surgeon: Lemond Stable, MD;  Location: [facility];  Service: Orthopedics;  Laterality: Left;   PACEMAKER INSERTION  1966   POLYPECTOMY  01/26/2017   Procedure: POLYPECTOMY;  Surgeon: Golda Claudis PENNER, MD;  Location[facility];  Service: Endoscopy;;  colon   PPM GENERATOR CHANGEOUT N/A 11/11/2018   Procedure: PPM GENERATOR CHANGEOUT;  Surgeon: Birmingham Danelle LELON, MD;  Location: [facility] INVASIVE CV LAB;  Service: Cardiovascular;  Laterality: N/A;     Family History  Problem Relation Age of Onset   Hypertension Other    Neuropathy Neg Hx    Stroke Neg Hx      Social History   Socioeconomic History   Marital status: Divorced    Spouse name: Not on file   Number of children: 3   Years of education: 12+   Highest education level: Not on file  Occupational History   Occupation: Retired  Tobacco Use   Smoking status: Former    Current packs/day: 0.00    Average packs/day: 1 pack/day for 0.5 years (0.5 ttl pk-yrs)    Types: Cigarettes    Start date: 09/26/1961    Quit date: 03/27/1962    Years since quitting: 62.0   Smokeless tobacco: Never  Vaping Use   Vaping status: Never Used  Substance and Sexual Activity   Alcohol use: No    Alcohol/week: 0.0 standard drinks of alcohol   Drug use: No   Sexual activity: Not Currently  Other Topics Concern   Not on file  Social History Narrative   Divorced with 3 adult children. Semi retired.    Caffeine use: seldom    Lives alone   Social Drivers of Corporate investment banker Strain: Not on file  Food Insecurity: Not on  file  Transportation Needs: Not on file  Physical Activity: Not on file  Stress: Not on file  Social Connections: Not on file  Intimate Partner Violence: Not on file     BP 132/70 (BP Location: Left Arm, Patient Position: Sitting, Cuff Size: Large)   Pulse 72   Ht 5' 8 (1.727 m)   Wt 257 lb 9.6 oz (116.8 kg)   SpO2 94%   BMI 39.17 kg/m   Physical Exam:  Obese appearing NAD HEENT: Unremarkable Neck:  No JVD, no thyromegally Lymphatics:  No adenopathy Back:  No CVA tenderness Lungs:  Clear with no wheezes HEART:  Regular rate rhythm, no murmurs, no rubs, no clicks Abd:  soft, positive bowel sounds, no organomegally, no rebound, no guarding Ext:  2 plus pulses, no edema, no cyanosis, no clubbing Skin:  No rashes no nodules Neuro:  CN II through XII intact, motor grossly intact  EKG - afib with ventricular pacing  DEVICE  Normal device function.  See PaceArt for details.   Assess/Plan:  Atrial fib - his VR is well controlled. No change in his meds. Obesity - his weight is up 10 lbs from his last visit.  Coags - he denies bleeding on eliquis . Heart block - his escape is 47/min today.  No change in his PM programming.   Danelle Fiora Weill,MD

## 2024-03-16 ENCOUNTER — Ambulatory Visit: Payer: Self-pay | Admitting: Internal Medicine

## 2024-03-25 ENCOUNTER — Encounter: Payer: Self-pay | Admitting: Internal Medicine

## 2024-06-11 ENCOUNTER — Ambulatory Visit: Payer: Medicare PPO

## 2024-06-11 DIAGNOSIS — I495 Sick sinus syndrome: Secondary | ICD-10-CM

## 2024-06-12 LAB — CUP PACEART REMOTE DEVICE CHECK
Battery Remaining Longevity: 49 mo
Battery Remaining Percentage: 43 %
Battery Voltage: 2.98 V
Brady Statistic RV Percent Paced: 99 %
Date Time Interrogation Session: 20250924020014
Implantable Lead Connection Status: 753985
Implantable Lead Connection Status: 753985
Implantable Lead Implant Date: 20090903
Implantable Lead Implant Date: 20090903
Implantable Lead Location: 753859
Implantable Lead Location: 753860
Implantable Pulse Generator Implant Date: 20200224
Lead Channel Impedance Value: 510 Ohm
Lead Channel Pacing Threshold Amplitude: 0.75 V
Lead Channel Pacing Threshold Pulse Width: 0.5 ms
Lead Channel Sensing Intrinsic Amplitude: 11.9 mV
Lead Channel Setting Pacing Amplitude: 2.5 V
Lead Channel Setting Pacing Pulse Width: 0.5 ms
Lead Channel Setting Sensing Sensitivity: 4 mV
Pulse Gen Model: 2272
Pulse Gen Serial Number: 9107097

## 2024-06-12 NOTE — Progress Notes (Signed)
 Remote pacemaker transmission.

## 2024-06-13 NOTE — Progress Notes (Signed)
 Remote PPM Transmission

## 2024-06-15 ENCOUNTER — Ambulatory Visit: Payer: Self-pay | Admitting: Internal Medicine

## 2024-06-16 DIAGNOSIS — I10 Essential (primary) hypertension: Secondary | ICD-10-CM | POA: Diagnosis not present

## 2024-06-16 DIAGNOSIS — I5032 Chronic diastolic (congestive) heart failure: Secondary | ICD-10-CM | POA: Diagnosis not present

## 2024-06-16 DIAGNOSIS — Z79899 Other long term (current) drug therapy: Secondary | ICD-10-CM | POA: Diagnosis not present

## 2024-06-16 DIAGNOSIS — I4821 Permanent atrial fibrillation: Secondary | ICD-10-CM | POA: Diagnosis not present

## 2024-06-23 DIAGNOSIS — D6869 Other thrombophilia: Secondary | ICD-10-CM | POA: Diagnosis not present

## 2024-06-23 DIAGNOSIS — I10 Essential (primary) hypertension: Secondary | ICD-10-CM | POA: Diagnosis not present

## 2024-06-23 DIAGNOSIS — I5032 Chronic diastolic (congestive) heart failure: Secondary | ICD-10-CM | POA: Diagnosis not present

## 2024-07-30 DIAGNOSIS — M1711 Unilateral primary osteoarthritis, right knee: Secondary | ICD-10-CM | POA: Diagnosis not present

## 2024-07-30 DIAGNOSIS — Z23 Encounter for immunization: Secondary | ICD-10-CM | POA: Diagnosis not present

## 2024-08-11 DIAGNOSIS — M25561 Pain in right knee: Secondary | ICD-10-CM | POA: Diagnosis not present

## 2024-08-28 ENCOUNTER — Telehealth: Payer: Self-pay | Admitting: Internal Medicine

## 2024-08-28 NOTE — Telephone Encounter (Signed)
° ° °  Primary Cardiologist: Danelle Birmingham, MD  Chart reviewed as part of pre-operative protocol coverage. Simple dental extractions are considered low risk procedures per guidelines and generally do not require any specific cardiac clearance. It is also generally accepted that for simple extractions and dental cleanings, there is no need to interrupt blood thinner therapy.   SBE prophylaxis is not required for the patient.  I will route this recommendation to the requesting party via Epic fax function and remove from pre-op  pool.  Please call with questions.  Lum LITTIE Louis, NP 08/28/2024, 4:18 PM

## 2024-08-28 NOTE — Telephone Encounter (Signed)
° °  Pre-operative Risk Assessment    Patient Name: [name].  DOB: [birth year] MRN: [id number]   Date of last office visit: 03/13/24 Date of next office visit: Not yet scheduled  Request for Surgical Clearance    Procedure:  Dental Extraction - Amount of Teeth to be Pulled:  1  Date of Surgery:  Clearance 09/01/24                                Surgeon:  Dr. Venetia Gelineau Surgeon's Group or Practice Name:    [facility] Phone number:  [phone]  Fax number:  [phone]   Type of Clearance Requested:   - Medical  - Pharmacy:  Hold Apixaban  (Eliquis )     Type of Anesthesia:  Not Indicated   Additional requests/questions:  Caller Sherrye) stated patient has appointment on 12/15 at 11:00 am.  Caller noted their office is closed on Friday (12/12) but can let patient know if he needs to hold his Eliquis .  Signed, Jasmin B Wilson   08/28/2024, 4:05 PM

## 2024-09-12 ENCOUNTER — Ambulatory Visit (INDEPENDENT_AMBULATORY_CARE_PROVIDER_SITE_OTHER): Payer: Medicare PPO

## 2024-09-12 DIAGNOSIS — I495 Sick sinus syndrome: Secondary | ICD-10-CM

## 2024-09-13 LAB — CUP PACEART REMOTE DEVICE CHECK
Battery Remaining Longevity: 46 mo
Battery Remaining Percentage: 41 %
Battery Voltage: 2.98 V
Brady Statistic RV Percent Paced: 99 %
Date Time Interrogation Session: 20251226033106
Implantable Lead Connection Status: 753985
Implantable Lead Connection Status: 753985
Implantable Lead Implant Date: 20090903
Implantable Lead Implant Date: 20090903
Implantable Lead Location: 753859
Implantable Lead Location: 753860
Implantable Pulse Generator Implant Date: 20200224
Lead Channel Impedance Value: 510 Ohm
Lead Channel Pacing Threshold Amplitude: 0.75 V
Lead Channel Pacing Threshold Pulse Width: 0.5 ms
Lead Channel Sensing Intrinsic Amplitude: 12 mV
Lead Channel Setting Pacing Amplitude: 2.5 V
Lead Channel Setting Pacing Pulse Width: 0.5 ms
Lead Channel Setting Sensing Sensitivity: 4 mV
Pulse Gen Model: 2272
Pulse Gen Serial Number: 9107097

## 2024-09-15 NOTE — Progress Notes (Signed)
 Remote PPM Transmission

## 2024-09-17 ENCOUNTER — Ambulatory Visit: Payer: Self-pay | Admitting: Cardiovascular Disease

## 2024-12-12 ENCOUNTER — Ambulatory Visit

## 2025-03-13 ENCOUNTER — Ambulatory Visit

## 2025-06-12 ENCOUNTER — Ambulatory Visit

## 2025-09-11 ENCOUNTER — Ambulatory Visit

## 2025-12-11 ENCOUNTER — Ambulatory Visit
